# Patient Record
Sex: Male | Born: 1940
Health system: Southern US, Community
[De-identification: ages and names within clinical notes are randomized; demographics above are authoritative.]

## PROBLEM LIST (undated history)

## (undated) DIAGNOSIS — M1990 Unspecified osteoarthritis, unspecified site: Secondary | ICD-10-CM

## (undated) DIAGNOSIS — F0390 Unspecified dementia without behavioral disturbance: Secondary | ICD-10-CM

## (undated) DIAGNOSIS — N189 Chronic kidney disease, unspecified: Secondary | ICD-10-CM

## (undated) DIAGNOSIS — J449 Chronic obstructive pulmonary disease, unspecified: Secondary | ICD-10-CM

## (undated) DIAGNOSIS — E785 Hyperlipidemia, unspecified: Secondary | ICD-10-CM

## (undated) DIAGNOSIS — C801 Malignant (primary) neoplasm, unspecified: Secondary | ICD-10-CM

## (undated) DIAGNOSIS — C61 Malignant neoplasm of prostate: Secondary | ICD-10-CM

## (undated) DIAGNOSIS — I10 Essential (primary) hypertension: Secondary | ICD-10-CM

## (undated) DIAGNOSIS — D649 Anemia, unspecified: Secondary | ICD-10-CM

## (undated) HISTORY — DX: Anemia, unspecified: D64.9

## (undated) HISTORY — DX: Chronic kidney disease, unspecified: N18.9

## (undated) HISTORY — DX: Unspecified osteoarthritis, unspecified site: M19.90

## (undated) HISTORY — PX: FOOT SURGERY: SHX648

## (undated) HISTORY — DX: Chronic obstructive pulmonary disease, unspecified: J44.9

## (undated) HISTORY — DX: Unspecified dementia, unspecified severity, without behavioral disturbance, psychotic disturbance, mood disturbance, and anxiety: F03.90

---

## 1999-10-26 ENCOUNTER — Emergency Department (HOSPITAL_COMMUNITY): Admission: EM | Admit: 1999-10-26 | Discharge: 1999-10-26 | Payer: Self-pay | Admitting: Emergency Medicine

## 1999-10-26 ENCOUNTER — Encounter: Payer: Self-pay | Admitting: Emergency Medicine

## 2000-05-05 ENCOUNTER — Encounter: Admission: RE | Admit: 2000-05-05 | Discharge: 2000-08-03 | Payer: Self-pay | Admitting: Internal Medicine

## 2005-10-05 HISTORY — PX: CATARACT EXTRACTION W/ INTRAOCULAR LENS IMPLANT: SHX1309

## 2007-06-07 ENCOUNTER — Ambulatory Visit (HOSPITAL_BASED_OUTPATIENT_CLINIC_OR_DEPARTMENT_OTHER): Admission: RE | Admit: 2007-06-07 | Discharge: 2007-06-08 | Payer: Self-pay | Admitting: Orthopedic Surgery

## 2008-10-05 HISTORY — PX: SHOULDER OPEN ROTATOR CUFF REPAIR: SHX2407

## 2011-02-17 NOTE — Op Note (Signed)
[name] [name], [name] [name]                ACCOUNT NO.:  [id number]   MEDICAL RECORD NO.:  [id number]          PATIENT TYPE:  AMB   LOCATION:  [facility]                         FACILITY:  [facility]   PHYSICIAN:  Georges Lynch. Gioffre, M.D.DATE OF BIRTH:  April 18, 1941   DATE OF PROCEDURE:  06/07/2007  DATE OF DISCHARGE:                               OPERATIVE REPORT   SURGEON:  Georges Lynch. Darrelyn Hillock, M.D.   OPERATIONS:  Arlyn Leak, PA   PREOPERATIVE DIAGNOSES:  1. Torn rotator cuff tendon, left shoulder.  2. Severe impingement syndrome, left shoulder.   POSTOPERATIVE DIAGNOSES:  1. Torn rotator cuff tendon, left shoulder.  2. Severe impingement syndrome, left shoulder.   OPERATION:  1. Partial acromionectomy acromioplasty left shoulder.  2. Repair of a hole in the rotator cuff tendon left.  3. Restore tendon graft, the left shoulder.   PROCEDURE:  Under general anesthesia, first of all the patient had a  interscalene nerve block the left.  He was brought back to the operating  room, given a general anesthetic.  He had 1 gram of IV Ancef preop.  Sterile prep and drape of the left shoulder was carried out.  An  incision was made over the anterior aspect left shoulder.  Bleeders  identified and cauterized.  At this time the deltoid tendon was  separated from the acromion in the usual fashion.  I split a small part  of the proximal portion of the deltoid muscle.  At this time self-  retaining retractors were inserted.  I excised the subdeltoid bursa  which was chronically inflamed.  I then noted a severe downsloping and  beak shaped acromion.  I protected the cup with the Bennett retractor,  utilized the oscillating saw, did a partial acromionectomy.  I then  utilized the bur to even out the undersurface of the acromion.  Following that I thoroughly irrigated out the area.  It was noted he had  a small hole in his rotator cuff that actually was shown on the MRI.  I  put a couple of sutures across the  tendon to repair the tendon  primarily.  Following that we noted the tendon was quite thin as it  inserted down in the humerus so we then utilized a Restore tendon graft  and sutured that to the tendon.  We thoroughly irrigated out the area.  We bone waxed the undersurface of the acromion.  We inserted a small  piece of thrombin-soaked Gelfoam in the subacromial space for hemostasis  purposes.  The deltoid tendon and muscle then was reapproximated in the  usual fashion.  Subcu was closed with Vicryl, skin with metal staples  and the patient was injected with 15 mL 0.5% Marcaine and epinephrine in  the wound site and sterile Neosporin dressing was applied.  He was  placed in a sling.          ______________________________  Georges Lynch. Darrelyn Hillock, M.D.    RAG/MEDQ  D:  06/07/2007  T:  06/07/2007  Job:  [id number]

## 2011-07-17 LAB — I-STAT 8, (EC8 V) (CONVERTED LAB)
Acid-base deficit: 1
BUN: 22
Bicarbonate: 25.3 — ABNORMAL HIGH
Chloride: 105
Glucose, Bld: 145 — ABNORMAL HIGH
HCT: 40
Hemoglobin: 13.6
Operator id: 268271
Potassium: 4.8
Sodium: 140
TCO2: 27
pCO2, Ven: 49
pH, Ven: 7.321 — ABNORMAL HIGH

## 2011-12-09 ENCOUNTER — Other Ambulatory Visit: Payer: Self-pay | Admitting: Urology

## 2011-12-09 DIAGNOSIS — C61 Malignant neoplasm of prostate: Secondary | ICD-10-CM

## 2011-12-21 ENCOUNTER — Encounter (HOSPITAL_COMMUNITY)
Admission: RE | Admit: 2011-12-21 | Discharge: 2011-12-21 | Disposition: A | Discharge status: Home / Self Care | Payer: 59 | Source: Ambulatory Visit | Attending: Urology | Admitting: Urology

## 2011-12-21 ENCOUNTER — Encounter (HOSPITAL_COMMUNITY): Payer: Self-pay

## 2011-12-21 DIAGNOSIS — C61 Malignant neoplasm of prostate: Secondary | ICD-10-CM | POA: Insufficient documentation

## 2011-12-21 DIAGNOSIS — R972 Elevated prostate specific antigen [PSA]: Secondary | ICD-10-CM | POA: Insufficient documentation

## 2011-12-21 HISTORY — DX: Malignant (primary) neoplasm, unspecified: C80.1

## 2011-12-21 MED ORDER — TECHNETIUM TC 99M MEDRONATE IV KIT: 25.0000 | PACK | Freq: Once | INTRAVENOUS | Status: DC | PRN

## 2012-01-05 ENCOUNTER — Ambulatory Visit (HOSPITAL_COMMUNITY)
Admission: RE | Admit: 2012-01-05 | Discharge: 2012-01-05 | Disposition: A | Discharge status: Home / Self Care | Payer: Medicare PPO | Source: Ambulatory Visit | Attending: Urology | Admitting: Urology

## 2012-01-05 ENCOUNTER — Other Ambulatory Visit: Payer: Self-pay | Admitting: Urology

## 2012-01-05 DIAGNOSIS — C7952 Secondary malignant neoplasm of bone marrow: Secondary | ICD-10-CM

## 2012-01-05 DIAGNOSIS — C7951 Secondary malignant neoplasm of bone: Secondary | ICD-10-CM

## 2012-01-05 DIAGNOSIS — C61 Malignant neoplasm of prostate: Secondary | ICD-10-CM | POA: Insufficient documentation

## 2012-01-05 DIAGNOSIS — M5137 Other intervertebral disc degeneration, lumbosacral region: Secondary | ICD-10-CM | POA: Insufficient documentation

## 2012-01-05 DIAGNOSIS — M51379 Other intervertebral disc degeneration, lumbosacral region without mention of lumbar back pain or lower extremity pain: Secondary | ICD-10-CM | POA: Insufficient documentation

## 2012-04-01 ENCOUNTER — Other Ambulatory Visit: Payer: Self-pay | Admitting: Urology

## 2012-04-01 DIAGNOSIS — C61 Malignant neoplasm of prostate: Secondary | ICD-10-CM

## 2012-04-06 ENCOUNTER — Encounter (HOSPITAL_COMMUNITY): Payer: Self-pay | Admitting: Pharmacy Technician

## 2012-04-08 ENCOUNTER — Other Ambulatory Visit: Payer: Self-pay | Admitting: Radiology

## 2012-04-11 ENCOUNTER — Encounter (HOSPITAL_COMMUNITY): Payer: Self-pay

## 2012-04-11 ENCOUNTER — Ambulatory Visit (HOSPITAL_COMMUNITY)
Admission: RE | Admit: 2012-04-11 | Discharge: 2012-04-11 | Disposition: A | Discharge status: Home / Self Care | Payer: Medicare PPO | Source: Ambulatory Visit | Attending: Urology | Admitting: Urology

## 2012-04-11 DIAGNOSIS — Z79899 Other long term (current) drug therapy: Secondary | ICD-10-CM | POA: Insufficient documentation

## 2012-04-11 DIAGNOSIS — E785 Hyperlipidemia, unspecified: Secondary | ICD-10-CM | POA: Insufficient documentation

## 2012-04-11 DIAGNOSIS — E119 Type 2 diabetes mellitus without complications: Secondary | ICD-10-CM | POA: Insufficient documentation

## 2012-04-11 DIAGNOSIS — R599 Enlarged lymph nodes, unspecified: Secondary | ICD-10-CM | POA: Insufficient documentation

## 2012-04-11 DIAGNOSIS — C61 Malignant neoplasm of prostate: Secondary | ICD-10-CM

## 2012-04-11 DIAGNOSIS — I10 Essential (primary) hypertension: Secondary | ICD-10-CM | POA: Insufficient documentation

## 2012-04-11 HISTORY — DX: Essential (primary) hypertension: I10

## 2012-04-11 HISTORY — DX: Hyperlipidemia, unspecified: E78.5

## 2012-04-11 LAB — CBC
MCH: 30.2 pg (ref 26.0–34.0)
MCV: 88.1 fL (ref 78.0–100.0)
Platelets: 147 10*3/uL — ABNORMAL LOW (ref 150–400)
RDW: 13.4 % (ref 11.5–15.5)
WBC: 6.2 10*3/uL (ref 4.0–10.5)

## 2012-04-11 LAB — PROTIME-INR: Prothrombin Time: 12.6 seconds (ref 11.6–15.2)

## 2012-04-11 LAB — GLUCOSE, CAPILLARY: Glucose-Capillary: 112 mg/dL — ABNORMAL HIGH (ref 70–99)

## 2012-04-11 MED ORDER — FENTANYL CITRATE 0.05 MG/ML IJ SOLN
INTRAMUSCULAR | Status: AC
  Filled 2012-04-11: qty 6

## 2012-04-11 MED ORDER — SODIUM CHLORIDE 0.9 % IV SOLN
Freq: Once | INTRAVENOUS | Status: AC
  Administered 2012-04-11: 08:00:00 via INTRAVENOUS

## 2012-04-11 MED ORDER — MIDAZOLAM HCL 5 MG/5ML IJ SOLN
INTRAMUSCULAR | Status: AC | PRN
  Administered 2012-04-11 (×4): 1 mg via INTRAVENOUS

## 2012-04-11 MED ORDER — MIDAZOLAM HCL 2 MG/2ML IJ SOLN
INTRAMUSCULAR | Status: AC
  Filled 2012-04-11: qty 6

## 2012-04-11 MED ORDER — FENTANYL CITRATE 0.05 MG/ML IJ SOLN
INTRAMUSCULAR | Status: AC | PRN
  Administered 2012-04-11: 50 ug via INTRAVENOUS
  Administered 2012-04-11: 100 ug via INTRAVENOUS
  Administered 2012-04-11: 50 ug via INTRAVENOUS

## 2012-04-11 MED ORDER — HYDROCODONE-ACETAMINOPHEN 5-325 MG PO TABS
1.0000 | ORAL_TABLET | ORAL | Status: DC | PRN
  Filled 2012-04-11: qty 2

## 2012-04-11 NOTE — Procedures (Signed)
Interventional Radiology Procedure Note  Procedure: CT guided core (18G) biopsy of right internal iliac node concerning for metastatic prostate ca. Complications: No immediate, pt tolerated procedure well.

## 2012-04-11 NOTE — H&P (Signed)
Chief Complaint: Prostate cancer Referring Physician:Dahlstedt HPI: [name] [name] is an 71 y.o. male with apparently newly diagnosed prostate cancer. He is referred for biopsy of a right iliac LN as discussed bu Dr. Retta Diones and Dr. Deanne Coffer. He otherwise feels fine.  Past Medical History:  Past Medical History  Diagnosis Date  . Cancer   . Hypertension   . Diabetes mellitus   . Hyperlipidemia     Past Surgical History:  Past Surgical History  Procedure Date  . No past surgeries     Family History: History reviewed. No pertinent family history.  Social History:  reports that he quit smoking about 7 years ago. He does not have any smokeless tobacco history on file. He reports that he does not use illicit drugs. His alcohol history not on file.  Allergies: No Known Allergies  Medications: albuterol (PROVENTIL) (2.5 MG/3ML) 0.083% nebulizer solution (Taking) Sig - Route: Take 2.5 mg by nebulization every 6 (six) hours as needed. Wheezing and shortness of breath - Nebulization Class: Historical Med aspirin 325 MG tablet (Taking) Sig - Route: Take 325 mg by mouth daily. - Oral Class: Historical Med cholecalciferol (VITAMIN D) 1000 UNITS tablet (Taking) Sig - Route: Take 1,000 Units by mouth daily. - Oral Class: Historical Med FLUoxetine (PROZAC) 40 MG capsule (Taking) Sig - Route: Take 40 mg by mouth every morning. - Oral Class: Historical Med glipiZIDE (GLUCOTROL) 10 MG tablet (Taking) Sig - Route: Take 10 mg by mouth 2 (two) times daily before a meal. - Oral Class: Historical Med hydrochlorothiazide (HYDRODIURIL) 25 MG tablet (Taking) Sig - Route: Take 25 mg by mouth every morning. - Oral Class: Historical Med hydroxypropyl methylcellulose (ISOPTO TEARS) 2.5 % ophthalmic solution (Taking) Sig - Route: Place 1 drop into both eyes as needed. - Both Eyes Class: Historical Med lisinopril (PRINIVIL,ZESTRIL) 20 MG tablet (Taking) Sig - Route: Take 10 mg by mouth every morning. - Oral Class:  Historical Med metFORMIN (GLUCOPHAGE) 1000 MG tablet (Taking) Sig - Route: Take 1,000 mg by mouth 2 (two) times daily with a meal. - Oral Class: Historical Med simvastatin (ZOCOR) 40 MG tablet (Taking) Sig - Route: Take 20 mg by mouth at bedtime. - Oral Class: Historical Med tiotropium (SPIRIVA) 18 MCG inhalation capsule (Taking) Sig - Route: Place 18 mcg into inhaler and inhale daily. - Inhalation Class: Historical Med   Please HPI for pertinent positives, otherwise complete 10 system ROS negative.  Physical Exam: Blood pressure 140/84, pulse 83, temperature 97.5 F (36.4 C), temperature source Oral, resp. rate 20, SpO2 95.00%. There is no height or weight on file to calculate BMI.   General Appearance:  Alert, cooperative, no distress, appears stated age  Head:  Normocephalic, without obvious abnormality, atraumatic  ENT: Unremarkable  Neck: Supple, symmetrical, trachea midline, no adenopathy, thyroid: not enlarged, symmetric, no tenderness/mass/nodules  Lungs:   Clear to auscultation bilaterally, no w/r/r, respirations unlabored without use of accessory muscles.  Chest Wall:  No tenderness or deformity  Heart:  Regular rate and rhythm, S1, S2 normal, no murmur, rub or gallop. Carotids 2+ without bruit.  Abdomen:   Soft, non-tender, non distended. Bowel sounds active all four quadrants,  no masses, no organomegaly.  Extremities: Extremities normal, atraumatic, no cyanosis or edema  Neurologic: Normal affect, no gross deficits.    Assessment/Plan Hx of prostate cancer. For CT guided right iliac LN biopsy. Procedure discussed including risks and complications. Labs pending. Consent signed in chart.  Brayton El PA-C 04/11/2012, 8:27 AM

## 2012-07-15 ENCOUNTER — Other Ambulatory Visit: Payer: Self-pay | Admitting: Urology

## 2012-07-15 DIAGNOSIS — C61 Malignant neoplasm of prostate: Secondary | ICD-10-CM

## 2012-08-01 ENCOUNTER — Encounter (HOSPITAL_COMMUNITY)
Admission: RE | Admit: 2012-08-01 | Discharge: 2012-08-01 | Disposition: A | Discharge status: Home / Self Care | Payer: Medicare PPO | Source: Ambulatory Visit | Attending: Urology | Admitting: Urology

## 2012-08-01 DIAGNOSIS — R972 Elevated prostate specific antigen [PSA]: Secondary | ICD-10-CM | POA: Insufficient documentation

## 2012-08-01 DIAGNOSIS — C61 Malignant neoplasm of prostate: Secondary | ICD-10-CM | POA: Insufficient documentation

## 2012-08-01 MED ORDER — TECHNETIUM TC 99M MEDRONATE IV KIT
25.0000 | PACK | Freq: Once | INTRAVENOUS | Status: AC | PRN
  Administered 2012-08-01: 25 via INTRAVENOUS

## 2013-07-04 ENCOUNTER — Inpatient Hospital Stay (HOSPITAL_COMMUNITY)
Admission: EM | Admit: 2013-07-04 | Discharge: 2013-07-07 | DRG: 558 | Disposition: A | Discharge status: Home Health | Payer: Medicare PPO | Attending: Internal Medicine | Admitting: Internal Medicine

## 2013-07-04 ENCOUNTER — Emergency Department (HOSPITAL_COMMUNITY): Payer: Medicare PPO

## 2013-07-04 ENCOUNTER — Encounter (HOSPITAL_COMMUNITY): Payer: Self-pay | Admitting: Emergency Medicine

## 2013-07-04 DIAGNOSIS — L089 Local infection of the skin and subcutaneous tissue, unspecified: Secondary | ICD-10-CM | POA: Diagnosis present

## 2013-07-04 DIAGNOSIS — W19XXXA Unspecified fall, initial encounter: Secondary | ICD-10-CM | POA: Diagnosis present

## 2013-07-04 DIAGNOSIS — N189 Chronic kidney disease, unspecified: Secondary | ICD-10-CM | POA: Diagnosis present

## 2013-07-04 DIAGNOSIS — N400 Enlarged prostate without lower urinary tract symptoms: Secondary | ICD-10-CM | POA: Diagnosis present

## 2013-07-04 DIAGNOSIS — M7918 Myalgia, other site: Secondary | ICD-10-CM

## 2013-07-04 DIAGNOSIS — Z87311 Personal history of (healed) other pathological fracture: Secondary | ICD-10-CM

## 2013-07-04 DIAGNOSIS — F329 Major depressive disorder, single episode, unspecified: Secondary | ICD-10-CM | POA: Diagnosis present

## 2013-07-04 DIAGNOSIS — Z8546 Personal history of malignant neoplasm of prostate: Secondary | ICD-10-CM

## 2013-07-04 DIAGNOSIS — M48061 Spinal stenosis, lumbar region without neurogenic claudication: Secondary | ICD-10-CM | POA: Diagnosis present

## 2013-07-04 DIAGNOSIS — C61 Malignant neoplasm of prostate: Secondary | ICD-10-CM | POA: Diagnosis present

## 2013-07-04 DIAGNOSIS — E119 Type 2 diabetes mellitus without complications: Secondary | ICD-10-CM

## 2013-07-04 DIAGNOSIS — F32A Depression, unspecified: Secondary | ICD-10-CM

## 2013-07-04 DIAGNOSIS — F3289 Other specified depressive episodes: Secondary | ICD-10-CM | POA: Diagnosis present

## 2013-07-04 DIAGNOSIS — N182 Chronic kidney disease, stage 2 (mild): Secondary | ICD-10-CM | POA: Diagnosis present

## 2013-07-04 DIAGNOSIS — W19XXXD Unspecified fall, subsequent encounter: Secondary | ICD-10-CM

## 2013-07-04 DIAGNOSIS — M6282 Rhabdomyolysis: Principal | ICD-10-CM

## 2013-07-04 DIAGNOSIS — M79659 Pain in unspecified thigh: Secondary | ICD-10-CM

## 2013-07-04 DIAGNOSIS — R0902 Hypoxemia: Secondary | ICD-10-CM

## 2013-07-04 DIAGNOSIS — Z87891 Personal history of nicotine dependence: Secondary | ICD-10-CM

## 2013-07-04 DIAGNOSIS — Y92009 Unspecified place in unspecified non-institutional (private) residence as the place of occurrence of the external cause: Secondary | ICD-10-CM

## 2013-07-04 DIAGNOSIS — I129 Hypertensive chronic kidney disease with stage 1 through stage 4 chronic kidney disease, or unspecified chronic kidney disease: Secondary | ICD-10-CM | POA: Diagnosis present

## 2013-07-04 DIAGNOSIS — J441 Chronic obstructive pulmonary disease with (acute) exacerbation: Secondary | ICD-10-CM

## 2013-07-04 DIAGNOSIS — IMO0002 Reserved for concepts with insufficient information to code with codable children: Secondary | ICD-10-CM | POA: Diagnosis present

## 2013-07-04 DIAGNOSIS — M79606 Pain in leg, unspecified: Secondary | ICD-10-CM | POA: Diagnosis present

## 2013-07-04 DIAGNOSIS — M79609 Pain in unspecified limb: Secondary | ICD-10-CM | POA: Diagnosis present

## 2013-07-04 DIAGNOSIS — I10 Essential (primary) hypertension: Secondary | ICD-10-CM

## 2013-07-04 DIAGNOSIS — E785 Hyperlipidemia, unspecified: Secondary | ICD-10-CM

## 2013-07-04 LAB — CBC WITH DIFFERENTIAL/PLATELET
Basophils Absolute: 0.1 10*3/uL (ref 0.0–0.1)
Eosinophils Absolute: 0.3 10*3/uL (ref 0.0–0.7)
Eosinophils Relative: 4 % (ref 0–5)
HCT: 42.5 % (ref 39.0–52.0)
Hemoglobin: 14.8 g/dL (ref 13.0–17.0)
Lymphocytes Relative: 14 % (ref 12–46)
Lymphs Abs: 1.2 10*3/uL (ref 0.7–4.0)
MCHC: 34.8 g/dL (ref 30.0–36.0)
MCV: 90.8 fL (ref 78.0–100.0)
Monocytes Absolute: 0.6 10*3/uL (ref 0.1–1.0)
Monocytes Relative: 8 % (ref 3–12)
Neutro Abs: 6 10*3/uL (ref 1.7–7.7)
RDW: 12.6 % (ref 11.5–15.5)

## 2013-07-04 LAB — BASIC METABOLIC PANEL
BUN: 19 mg/dL (ref 6–23)
CO2: 28 mEq/L (ref 19–32)
Calcium: 9.5 mg/dL (ref 8.4–10.5)
Chloride: 96 mEq/L (ref 96–112)
Creatinine, Ser: 1.24 mg/dL (ref 0.50–1.35)
Glucose, Bld: 58 mg/dL — ABNORMAL LOW (ref 70–99)

## 2013-07-04 LAB — GLUCOSE, CAPILLARY

## 2013-07-04 MED ORDER — INSULIN ASPART 100 UNIT/ML ~~LOC~~ SOLN
0.0000 [IU] | Freq: Every day | SUBCUTANEOUS | Status: DC
  Administered 2013-07-05: 3 [IU] via SUBCUTANEOUS

## 2013-07-04 MED ORDER — SODIUM CHLORIDE 0.9 % IV SOLN
INTRAVENOUS | Status: DC
  Administered 2013-07-04: 14:00:00 via INTRAVENOUS

## 2013-07-04 MED ORDER — POLYVINYL ALCOHOL 1.4 % OP SOLN
1.0000 [drp] | OPHTHALMIC | Status: DC | PRN
  Filled 2013-07-04: qty 15

## 2013-07-04 MED ORDER — ACETAMINOPHEN 650 MG RE SUPP: 650.0000 mg | Freq: Four times a day (QID) | RECTAL | Status: DC | PRN

## 2013-07-04 MED ORDER — FINASTERIDE 5 MG PO TABS
5.0000 mg | ORAL_TABLET | Freq: Every day | ORAL | Status: DC
  Administered 2013-07-05 – 2013-07-07 (×3): 5 mg via ORAL
  Filled 2013-07-04 (×3): qty 1

## 2013-07-04 MED ORDER — ALBUTEROL SULFATE (5 MG/ML) 0.5% IN NEBU: 2.5000 mg | INHALATION_SOLUTION | Freq: Four times a day (QID) | RESPIRATORY_TRACT | Status: DC | PRN

## 2013-07-04 MED ORDER — HYDROMORPHONE HCL PF 1 MG/ML IJ SOLN
1.0000 mg | Freq: Once | INTRAMUSCULAR | Status: AC
  Administered 2013-07-04: 1 mg via INTRAVENOUS

## 2013-07-04 MED ORDER — PANTOPRAZOLE SODIUM 40 MG PO TBEC
40.0000 mg | DELAYED_RELEASE_TABLET | Freq: Every day | ORAL | Status: DC
  Administered 2013-07-05 – 2013-07-07 (×3): 40 mg via ORAL
  Filled 2013-07-04 (×3): qty 1

## 2013-07-04 MED ORDER — ALBUTEROL SULFATE (5 MG/ML) 0.5% IN NEBU
5.0000 mg | INHALATION_SOLUTION | Freq: Once | RESPIRATORY_TRACT | Status: AC
  Administered 2013-07-04: 5 mg via RESPIRATORY_TRACT

## 2013-07-04 MED ORDER — INSULIN ASPART 100 UNIT/ML ~~LOC~~ SOLN
0.0000 [IU] | Freq: Three times a day (TID) | SUBCUTANEOUS | Status: DC
  Administered 2013-07-05: 8 [IU] via SUBCUTANEOUS
  Administered 2013-07-05 – 2013-07-06 (×2): 5 [IU] via SUBCUTANEOUS
  Administered 2013-07-06: 8 [IU] via SUBCUTANEOUS
  Administered 2013-07-06: 5 [IU] via SUBCUTANEOUS

## 2013-07-04 MED ORDER — DIPHENHYDRAMINE HCL 25 MG PO CAPS
ORAL_CAPSULE | ORAL | Status: AC
  Filled 2013-07-04: qty 1

## 2013-07-04 MED ORDER — HYDROMORPHONE HCL PF 1 MG/ML IJ SOLN: 1.0000 mg | Freq: Once | INTRAMUSCULAR | Status: DC

## 2013-07-04 MED ORDER — METHYLPREDNISOLONE SODIUM SUCC 40 MG IJ SOLR
40.0000 mg | Freq: Two times a day (BID) | INTRAMUSCULAR | Status: DC
  Administered 2013-07-04 – 2013-07-05 (×2): 40 mg via INTRAVENOUS
  Filled 2013-07-04 (×3): qty 1

## 2013-07-04 MED ORDER — ACETAMINOPHEN 325 MG PO TABS: 650.0000 mg | ORAL_TABLET | Freq: Four times a day (QID) | ORAL | Status: DC | PRN

## 2013-07-04 MED ORDER — ONDANSETRON HCL 4 MG/2ML IJ SOLN: 4.0000 mg | Freq: Four times a day (QID) | INTRAMUSCULAR | Status: DC | PRN

## 2013-07-04 MED ORDER — TIOTROPIUM BROMIDE MONOHYDRATE 18 MCG IN CAPS
18.0000 ug | ORAL_CAPSULE | Freq: Every day | RESPIRATORY_TRACT | Status: DC
  Administered 2013-07-05 – 2013-07-06 (×2): 18 ug via RESPIRATORY_TRACT
  Filled 2013-07-04: qty 5

## 2013-07-04 MED ORDER — HYDROMORPHONE HCL PF 1 MG/ML IJ SOLN
INTRAMUSCULAR | Status: AC
  Administered 2013-07-04: 1 mg via INTRAVENOUS
  Filled 2013-07-04: qty 1

## 2013-07-04 MED ORDER — HYDROMORPHONE HCL PF 1 MG/ML IJ SOLN
INTRAMUSCULAR | Status: AC
  Filled 2013-07-04: qty 1

## 2013-07-04 MED ORDER — HYDROCHLOROTHIAZIDE 25 MG PO TABS
25.0000 mg | ORAL_TABLET | Freq: Every morning | ORAL | Status: DC
  Administered 2013-07-05 – 2013-07-07 (×3): 25 mg via ORAL
  Filled 2013-07-04 (×3): qty 1

## 2013-07-04 MED ORDER — BUDESONIDE-FORMOTEROL FUMARATE 160-4.5 MCG/ACT IN AERO
2.0000 | INHALATION_SPRAY | Freq: Two times a day (BID) | RESPIRATORY_TRACT | Status: DC
  Administered 2013-07-05 – 2013-07-06 (×4): 2 via RESPIRATORY_TRACT
  Filled 2013-07-04: qty 6

## 2013-07-04 MED ORDER — HYPROMELLOSE (GONIOSCOPIC) 2.5 % OP SOLN: 1.0000 [drp] | OPHTHALMIC | Status: DC | PRN

## 2013-07-04 MED ORDER — HYDROMORPHONE HCL PF 1 MG/ML IJ SOLN: 1.0000 mg | INTRAMUSCULAR | Status: DC | PRN

## 2013-07-04 MED ORDER — DIPHENHYDRAMINE HCL 25 MG PO CAPS
25.0000 mg | ORAL_CAPSULE | Freq: Once | ORAL | Status: AC
  Administered 2013-07-04: 25 mg via ORAL

## 2013-07-04 MED ORDER — IBUPROFEN 200 MG PO TABS
600.0000 mg | ORAL_TABLET | Freq: Once | ORAL | Status: AC
  Administered 2013-07-04: 600 mg via ORAL

## 2013-07-04 MED ORDER — FLUOXETINE HCL 20 MG PO CAPS
40.0000 mg | ORAL_CAPSULE | Freq: Every morning | ORAL | Status: DC
  Administered 2013-07-05 – 2013-07-07 (×3): 40 mg via ORAL
  Filled 2013-07-04 (×3): qty 2

## 2013-07-04 MED ORDER — LISINOPRIL 5 MG PO TABS
5.0000 mg | ORAL_TABLET | Freq: Every morning | ORAL | Status: DC
  Administered 2013-07-05 – 2013-07-06 (×2): 5 mg via ORAL
  Filled 2013-07-04 (×3): qty 1

## 2013-07-04 MED ORDER — SODIUM CHLORIDE 0.9 % IV SOLN
INTRAVENOUS | Status: DC
  Administered 2013-07-04 – 2013-07-06 (×5): via INTRAVENOUS

## 2013-07-04 MED ORDER — HEPARIN SODIUM (PORCINE) 5000 UNIT/ML IJ SOLN
5000.0000 [IU] | Freq: Three times a day (TID) | INTRAMUSCULAR | Status: DC
Start: 2013-07-04 — End: 2013-07-07
  Administered 2013-07-04 – 2013-07-07 (×8): 5000 [IU] via SUBCUTANEOUS
  Filled 2013-07-04 (×11): qty 1

## 2013-07-04 MED ORDER — HYDROMORPHONE HCL PF 1 MG/ML IJ SOLN
1.0000 mg | INTRAMUSCULAR | Status: AC | PRN
  Administered 2013-07-04 (×2): 1 mg via INTRAVENOUS

## 2013-07-04 MED ORDER — SODIUM CHLORIDE 0.9 % IN NEBU
INHALATION_SOLUTION | RESPIRATORY_TRACT | Status: AC
  Administered 2013-07-04: 16:00:00
  Filled 2013-07-04: qty 3

## 2013-07-04 MED ORDER — ALBUTEROL SULFATE (5 MG/ML) 0.5% IN NEBU
INHALATION_SOLUTION | RESPIRATORY_TRACT | Status: AC
  Administered 2013-07-04: 5 mg via RESPIRATORY_TRACT
  Filled 2013-07-04: qty 1

## 2013-07-04 MED ORDER — IBUPROFEN 200 MG PO TABS
ORAL_TABLET | ORAL | Status: AC
  Filled 2013-07-04: qty 3

## 2013-07-04 MED ORDER — ONDANSETRON HCL 4 MG PO TABS: 4.0000 mg | ORAL_TABLET | Freq: Four times a day (QID) | ORAL | Status: DC | PRN

## 2013-07-04 MED ORDER — ASPIRIN 325 MG PO TABS
325.0000 mg | ORAL_TABLET | Freq: Every day | ORAL | Status: DC
  Administered 2013-07-05 – 2013-07-07 (×3): 325 mg via ORAL
  Filled 2013-07-04 (×4): qty 1

## 2013-07-04 MED ORDER — OXYCODONE HCL 5 MG PO TABS
5.0000 mg | ORAL_TABLET | ORAL | Status: DC | PRN
  Administered 2013-07-05: 5 mg via ORAL
  Filled 2013-07-04: qty 1

## 2013-07-04 NOTE — Progress Notes (Signed)
Rn attempt x2 to get report from ED was unsuccessful. Will try again.

## 2013-07-04 NOTE — ED Provider Notes (Signed)
CSN: [id number]     Arrival date & time 07/04/13  1210 History   First MD Initiated Contact with Patient 07/04/13 1236     Chief Complaint  Patient presents with  . Leg Pain    HPI Pt fell about a week and a half ago.  Since that time he started having pain in his hip and right leg.  The pain increases with trying to stand or walk at all.  He also has pain in his lower back but he does not think it is related.  No numbness or weakness but the pain is so severe he cannot walk..  He has noticed some swelling in his toes but not his leg or feet. The pain is sharp. In the right hip and thigh. The symptoms have been increasing in severity. Patient denies any trouble with nausea vomiting or abdominal pain to me. He has a history of prostate cancer without metastases to the bone. He is scheduled for routine surveillance test. Patient is not on any chemotherapy. Past Medical History  Diagnosis Date  . Cancer   . Hypertension   . Diabetes mellitus   . Hyperlipidemia    Past Surgical History  Procedure Laterality Date  . No past surgeries    . Cataract extraction w/ intraocular lens implant  2007    bilateral  . Shoulder open rotator cuff repair  2010    left   No family history on file. History  Substance Use Topics  . Smoking status: Former Smoker    Quit date: 04/11/2005  . Smokeless tobacco: Not on file  . Alcohol Use:     Review of Systems  All other systems reviewed and are negative.    Allergies  Review of patient's allergies indicates no known allergies.  Home Medications   Current Outpatient Rx  Name  Route  Sig  Dispense  Refill  . albuterol (PROVENTIL) (2.5 MG/3ML) 0.083% nebulizer solution   Nebulization   Take 2.5 mg by nebulization every 6 (six) hours as needed for wheezing or shortness of breath.          Marland Kitchen aspirin 325 MG tablet   Oral   Take 325 mg by mouth daily.         . cholecalciferol (VITAMIN D) 1000 UNITS tablet   Oral   Take 1,000 Units by  mouth daily.         . finasteride (PROSCAR) 5 MG tablet   Oral   Take 5 mg by mouth daily.         Marland Kitchen FLUoxetine (PROZAC) 40 MG capsule   Oral   Take 40 mg by mouth every morning.         Marland Kitchen glipiZIDE (GLUCOTROL) 10 MG tablet   Oral   Take 10 mg by mouth 2 (two) times daily before a meal.         . hydrochlorothiazide (HYDRODIURIL) 25 MG tablet   Oral   Take 25 mg by mouth every morning.         . hydroxypropyl methylcellulose (ISOPTO TEARS) 2.5 % ophthalmic solution   Both Eyes   Place 1 drop into both eyes as needed (for dryness).          Marland Kitchen lisinopril (PRINIVIL,ZESTRIL) 20 MG tablet   Oral   Take 10 mg by mouth every morning.         . metFORMIN (GLUCOPHAGE) 1000 MG tablet   Oral   Take 1,000 mg by mouth 2 (  two) times daily with a meal.         . simvastatin (ZOCOR) 40 MG tablet   Oral   Take 20 mg by mouth at bedtime.         Marland Kitchen tiotropium (SPIRIVA) 18 MCG inhalation capsule   Inhalation   Place 18 mcg into inhaler and inhale daily.          BP 169/86  Pulse 76  Temp(Src) 98.4 F (36.9 C) (Oral)  Resp 17  SpO2 93% Physical Exam  Nursing note and vitals reviewed. Constitutional: He appears well-developed and well-nourished. No distress.  HENT:  Head: Normocephalic and atraumatic.  Right Ear: External ear normal.  Left Ear: External ear normal.  Eyes: Conjunctivae are normal. Right eye exhibits no discharge. Left eye exhibits no discharge. No scleral icterus.  Neck: Neck supple. No tracheal deviation present.  Cardiovascular: Normal rate, regular rhythm and intact distal pulses.   Pulmonary/Chest: Effort normal and breath sounds normal. No stridor. No respiratory distress. He has no wheezes. He has no rales.  Abdominal: Soft. Bowel sounds are normal. He exhibits no distension. There is no tenderness. There is no rebound and no guarding.  Musculoskeletal: He exhibits tenderness (mild tenderness palpation right hip, some pain with internal  and external rotation of the leg, patient is able to extend his right leg at the hip). He exhibits no edema.       Lumbar back: He exhibits no tenderness and no bony tenderness.  No tenderness to palpation with pelvic compression no instability, normal pulses bilateral lower extremities, no edema  Neurological: He is alert. He has normal strength. No sensory deficit. Cranial nerve deficit:  no gross defecits noted. He exhibits normal muscle tone. He displays no seizure activity. Coordination normal.  Skin: Skin is warm and dry. No rash noted.  Psychiatric: He has a normal mood and affect.    ED Course  Procedures (including critical care time) Labs Review Labs Reviewed  BASIC METABOLIC PANEL - Abnormal; Notable for the following:    Glucose, Bld 58 (*)    GFR calc non Af Amer 56 (*)    GFR calc Af Amer 65 (*)    All other components within normal limits  CBC WITH DIFFERENTIAL   Imaging Review Dg Lumbar Spine Complete  07/04/2013   CLINICAL DATA:  Fall, low back pain  EXAM: LUMBAR SPINE - COMPLETE 4+ VIEW  COMPARISON:  CT abdomen pelvis dated 01/28/2012  FINDINGS: Five lumbar type vertebral bodies.  Straightening of the lumbar spine.  Mild superior endplate compression deformity at L2, chronic.  Vertebral body heights are otherwise maintained.  Moderate multilevel degenerative changes.  Visualized bony pelvis appears intact.  Vascular calcifications.  IMPRESSION: Mild superior endplate compression deformity at L2, chronic.  Moderate multilevel degenerative changes.   Electronically Signed   By: Charline Bills M.D.   On: 07/04/2013 14:08   Dg Hip Complete Right  07/04/2013   CLINICAL DATA:  Fall, right hip pain  EXAM: RIGHT HIP - COMPLETE 2+ VIEW  COMPARISON:  None.  FINDINGS: No fracture or dislocation is seen.  Bilateral hip joints are symmetric.  Visualized bony pelvis appears intact.  Degenerative changes of the lower lumbar spine.  Vascular calcifications.  IMPRESSION: No fracture or  dislocation is seen.   Electronically Signed   By: Charline Bills M.D.   On: 07/04/2013 14:06   1500  Pt's xrays do not show any acute finding.  Pt however is still having severe pain.  Unable to ambulate.  Will give additional pain medications.  Plan on MRI of lumbar and hip to evaluate for occult fracture.  Borderline o2 sats.  Pt does smoke.  Will check CXR and give albuterol neb. MDM   Will turn over to oncoming MD.  MRI pending.    Celene Kras, MD 07/04/13 [id number]

## 2013-07-04 NOTE — ED Notes (Signed)
Pt returned from [facility], requesting pain meds.

## 2013-07-04 NOTE — H&P (Signed)
[facility] History and Physical  [name] [name] EAV:[id number] DOB: 11/30/1940 DOA: 07/04/2013  Referring physician: Dr. Wilkie Aye PCP: Hoyle Sauer, MD   Chief Complaint: Mechanical fall, bilateral lower extremities pain and hypoxia  HPI: [name] [name] is a 72 y.o. male with past medical history significant for hypertension, hyperlipidemia, diabetes and history of prostate cancer/BPH; came to the hospital complaining of bilateral lower strandy pain. Patient reports that he fail about week and a half ago at home. He was a mechanical fall. Since that time he started having pain affecting both hips and thighs. Patient reports that despite the use of NSAID's, the pain continues to bother him to the point that he is unable to walk or to bear any weight at this moment. Patient denies any chest pain, nausea, vomiting, abdominal pain, dysuria, no worsening shortness of breath (reports that time to time he needs to stop on exertion in order to catch his breath and that he may have some nonproductive cough here and there) or any other acute complaints.   in the ED patient was found to have bilateral muscle inflammation affecting both thighs, hypoxia and decreased breath sounds bilaterally. [facility] has been called to admit the patient for further evaluation and treatment.   Review of Systems:  Negative except as otherwise mentioned on history of present illness.  Past Medical History  Diagnosis Date  . Cancer   . Hypertension   . Diabetes mellitus   . Hyperlipidemia    Past Surgical History  Procedure Laterality Date  . No past surgeries    . Cataract extraction w/ intraocular lens implant  2007    bilateral  . Shoulder open rotator cuff repair  2010    left   Social History:  reports that he quit smoking about 8 years ago. He does not have any smokeless tobacco history on file. He reports that he does not use illicit drugs. His alcohol history is not on file.  No Known  Allergies  Family history: Positive for hypertension, diabetes, hyperlipidemia  Prior to Admission medications   Medication Sig Start Date End Date Taking? Authorizing Provider  albuterol (PROVENTIL) (2.5 MG/3ML) 0.083% nebulizer solution Take 2.5 mg by nebulization every 6 (six) hours as needed for wheezing or shortness of breath.    Yes Historical Provider, MD  aspirin 325 MG tablet Take 325 mg by mouth daily.   Yes Historical Provider, MD  cholecalciferol (VITAMIN D) 1000 UNITS tablet Take 1,000 Units by mouth daily.   Yes Historical Provider, MD  finasteride (PROSCAR) 5 MG tablet Take 5 mg by mouth daily.   Yes Historical Provider, MD  FLUoxetine (PROZAC) 40 MG capsule Take 40 mg by mouth every morning.   Yes Historical Provider, MD  glipiZIDE (GLUCOTROL) 10 MG tablet Take 10 mg by mouth 2 (two) times daily before a meal.   Yes Historical Provider, MD  hydrochlorothiazide (HYDRODIURIL) 25 MG tablet Take 25 mg by mouth every morning.   Yes Historical Provider, MD  hydroxypropyl methylcellulose (ISOPTO TEARS) 2.5 % ophthalmic solution Place 1 drop into both eyes as needed (for dryness).    Yes Historical Provider, MD  lisinopril (PRINIVIL,ZESTRIL) 20 MG tablet Take 10 mg by mouth every morning.   Yes Historical Provider, MD  metFORMIN (GLUCOPHAGE) 1000 MG tablet Take 1,000 mg by mouth 2 (two) times daily with a meal.   Yes Historical Provider, MD  simvastatin (ZOCOR) 40 MG tablet Take 20 mg by mouth at bedtime.   Yes Historical  Provider, MD  tiotropium (SPIRIVA) 18 MCG inhalation capsule Place 18 mcg into inhaler and inhale daily.   Yes Historical Provider, MD   Physical Exam: Filed Vitals:   07/04/13 2135  BP: 127/78  Pulse: 108  Temp:   Resp:      General:  Cooperative to examination, no acute distress, patient with difficulties completing full sentences.  Eyes: PERRLA, no icterus, extraocular muscles intact  ENT: Dentures in place, no erythema or exudates, patient without any  drainage out of his ears or nostrils  Neck: Supple, no thyromegaly, no bruits  Cardiovascular: S1 and S2, mild tachycardia, no murmurs, no gallops, no rubs  Respiratory: Decreased air movement, no wheezing, no crackles  Abdomen: Soft, nontender, nondistended, positive bowel sounds  Skin: no rash, no petechiae  Musculoskeletal: noted swelling of his toes and trace of pedal edema bilaterally; patient with tenderness to go patient on his thighs bilaterally (right more than left), also with decrease in strength pushing on his knees from sitting position do to ongoing pain in his thighs.  Psychiatric: stable mood, no suicidal ideation or hallucinations.  Neurologic: cranial nerves grossly intact, 3-4/5 lower extremity proximal weakness secondary to ongoing pain in his thighs, upper extremities strength 5 out of 5 bilaterally symmetrically, normal finger-to-nose, no sensory deficit.  Labs on Admission:  Basic Metabolic Panel:  Recent Labs Lab 07/04/13 1230  NA 136  K 4.3  CL 96  CO2 28  GLUCOSE 58*  BUN 19  CREATININE 1.24  CALCIUM 9.5   CBC:  Recent Labs Lab 07/04/13 1230  WBC 8.2  NEUTROABS 6.0  HGB 14.8  HCT 42.5  MCV 90.8  PLT 220   CBG:  Recent Labs Lab 07/04/13 1726  GLUCAP 84    Radiological Exams on Admission: Dg Chest 2 View  07/04/2013   CLINICAL DATA:  Hypertension.  EXAM: CHEST  2 VIEW  COMPARISON:  None.  FINDINGS: The cardiac silhouette is normal in size. Mediastinum is normal in contour. There are no hilar masses.  Reticular opacities are noted in the lung bases most likely scarring, subsegmental atelectasis or a combination. This is most prominent in the right lung base. Consider an infiltrate or bronchitis in the proper clinical setting.  The lungs are otherwise clear. No pleural effusion or pneumothorax.  The bony thorax is demineralized but intact.  IMPRESSION: Mild lung base opacity, greater on the right. This is most likely subsegmental  atelectasis and/ or chronic scarring. Consider an infiltrate or bronchitis at the right lung base if there are symptoms consistent with this.   Electronically Signed   By: Amie Portland   On: 07/04/2013 16:29   Dg Lumbar Spine Complete  07/04/2013   CLINICAL DATA:  Fall, low back pain  EXAM: LUMBAR SPINE - COMPLETE 4+ VIEW  COMPARISON:  CT abdomen pelvis dated 01/28/2012  FINDINGS: Five lumbar type vertebral bodies.  Straightening of the lumbar spine.  Mild superior endplate compression deformity at L2, chronic.  Vertebral body heights are otherwise maintained.  Moderate multilevel degenerative changes.  Visualized bony pelvis appears intact.  Vascular calcifications.  IMPRESSION: Mild superior endplate compression deformity at L2, chronic.  Moderate multilevel degenerative changes.   Electronically Signed   By: Charline Bills M.D.   On: 07/04/2013 14:08   Dg Hip Complete Right  07/04/2013   CLINICAL DATA:  Fall, right hip pain  EXAM: RIGHT HIP - COMPLETE 2+ VIEW  COMPARISON:  None.  FINDINGS: No fracture or dislocation is seen.  Bilateral  hip joints are symmetric.  Visualized bony pelvis appears intact.  Degenerative changes of the lower lumbar spine.  Vascular calcifications.  IMPRESSION: No fracture or dislocation is seen.   Electronically Signed   By: Charline Bills M.D.   On: 07/04/2013 14:06   Mr Lumbar Spine Wo Contrast  07/04/2013   CLINICAL DATA:  72 year old male status post fall with severe pain radiating to the right hip. Cannot walk or bear weight.  EXAM: MRI LUMBAR SPINE WITHOUT CONTRAST  TECHNIQUE: Multiplanar, multisequence MR imaging was performed. No intravenous contrast was administered.  COMPARISON:  Lumbar radiographs 07/04/2013. [facility] CT Abdomen and Pelvis 01/28/2012.  FINDINGS: Normal lumbar segmentation seen on comparisons. Chronic L2 compression fracture. There is mild marrow edema in the bilateral far lateral L2 and L3 vertebral bodies. No L3 loss of  vertebral body height. See also disc level findings below.  Stable vertebral height and alignment elsewhere with chronic straightening of cervical lordosis. Trace marrow edema at the L5 superior endplate appears to be associated with a chronic Schmorl's node.  There is moderate marrow edema of the right L4 pedicle (series 6, image 6 and series 4, image 6 and series 8, image 20). No displaced pedicle fracture is evident.  Visible lower thoracic levels appear intact.  Scattered mild increased T2 and STIR signal in the erector spinae muscles bilaterally. These could be sequelae of trauma or altered biomechanics.  Visualized lower thoracic spinal cord is normal with conus medularis at T12-L1. Bb negative visualized abdominal viscera. The bladder appears mildly distended.  T11-T12: Disc bulge and posterior element hypertrophy. No definite spinal stenosis. Bilateral foraminal stenosis appears greater on the left but is not well evaluated due to motion.  T12-L1: Negative.  L1-L2: Right eccentric circumferential disc bulge mild facet hypertrophy. Mild epidural lipomatosis. Mild spinal stenosis and right L1 foraminal stenosis.  L2-L3: Severe disc space loss. Circumferential disc osteophyte complex. Epidural lipomatosis and mild to moderate facet and ligament flavum hypertrophy. Severe spinal stenosis. Severe left and moderate right L2 foraminal stenosis.  L3-L4: Right eccentric circumferential disc bulge. Epidural lipomatosis. Mild facet and ligament flavum hypertrophy. Moderate spinal stenosis.  L4-L5: Circumferential disc bulge eccentric to the right. Moderate facet and ligament flavum hypertrophy greater on the right. Epidural lipomatosis. Moderate to severe spinal stenosis. Severe right L4 foraminal stenosis.  L5-S1: Circumferential disc osteophyte complex with small left paracentral disc protrusion. Epidural lipomatosis. Mild facet hypertrophy. Mild spinal stenosis with mild to moderate left lateral recess stenosis.  Moderate to severe bilateral L5 foraminal stenosis.  IMPRESSION: 1. With regard to recent injury:  - there is focal marrow edema in the right L4 pedicle which may reflect a nondisplaced isolated pedicle fracture.  - there is mild marrow edema in mid L2 and L3 vertebral bodies suggestive of bone contusions. Chronic L2 compression fracture is stable and no new loss of lumbar vertebral body height or alignment is demonstrated.  - there is scattered increased signal in the bilateral erector spinae muscles which could reflect injury or altered biomechanics related to the above.  2. Severe multifactorial spinal stenosis at L2-L3. Moderate to severe spinal stenosis at L4-L5 with severe right foraminal stenosis. Moderate spinal stenosis at L3-L4 and mild lumbar spinal stenosis elsewhere.  3. Moderate or severe neural foraminal stenosis at the bilateral L2, right L4, and bilateral L5 nerve levels.   Electronically Signed   By: Augusto Gamble M.D.   On: 07/04/2013 19:22   Mr Hip Right Wo Contrast  07/04/2013  CLINICAL DATA:  Larey Seat. Right hip pain.  EXAM: MRI OF THE RIGHT HIP WITHOUT CONTRAST  TECHNIQUE: Multiplanar, multisequence MR imaging was performed. No intravenous contrast was administered.  COMPARISON:  Right hip radiographs 07/04/2013.  FINDINGS: Exam limited by patient motion.  No obvious hip or pelvic fracture. The pubic symphysis and SI joints are intact. Both hips are normally located. Moderate degenerative changes bilaterally. No joint effusion. Subchondral cystic change noted in the right acetabulum.  There are acute muscle injuries involving the left pectineus and obturator externus muscles in the right obturator internus muscle.  No significant intrapelvic abnormalities. No inguinal mass or hernia.  IMPRESSION: No acute hip or pelvic fracture.  Adductor muscle injuries bilaterally   Electronically Signed   By: Loralie Champagne M.D.   On: 07/04/2013 19:17   Assessment/Plan 1-Thigh pain, musculoskeletal: With  concerns for myositis vs associated to statins versus inflammation from recent injury. -Will admit to MedSurg bed -Will check ESR, aldolase and CK level -Will give empiric pulse of steroids and ask physical therapy to evaluate and treat. -Patient might require outpatient rheumatology visit and possible muscle biopsy for further workup  2-degenerative disc disease: To be chronic. At this moment not causing any specific neurologic deficit. Will continue pain medication, physical rehabilitation and patient will benefit of outpatient evaluation by neurosurgery service.  3-H/O prostate cancer with BPH: Continue finasteride. Outpatient followup with urology service.  4-Diabetes mellitus: Without mention of complication and well controlled according to patient. Will check hemoglobin A1c. While in the hospital will hold glipizide and metformin and Will use sliding scale insulin. -Anticipate blood sugar to get out of control with the use of steroids.  5-HTN (hypertension): Stable. Will continue home medication (lisinopril and HCTZ).  6-History of tobacco abuse: Remote. Patient reports that he has stopped smoking about 8 years ago.  7-Fall: Mechanical fall in nature. Patient treated with the rug inside his house approximately one week and a half ago. No acute fractures on images studies performed in the ED. -Inflammation affecting both of his thighs; probably myositis vs a stat tenths in the use versus secondary to recent musculoskeletal injury  8-COPD and Hypoxia: Most likely secondary to underlying COPD and mild exacerbation; especially with his chronic history of tobacco abuse. Patient reports that at home sometimes he needs to stop just to catch his breath while doing any kind of a exertional activity. -Will provide oxygen supplementation to try to keep his oxygen saturation above 90% -Will check his needs for home oxygen -Patient will continue when necessary nebulizer treatments and will also start  him on Symbicort twice a day. -Will continue his spiriva -Patient will benefit of outpatient PFTs and to establish care with a pulmonologist -Patient will be started on Solu-Medrol as well.  9-HLD (hyperlipidemia): Will check lipid panel. Holding the statins at this moment in case that is also part of the cause for myalgia and myositis.  10-Depression: Continue Prozac  11-Chronic kidney disease: Most likely secondary to hypertension and diabetes. At this moment appears to be at baseline. Patient chronic kidney disease stage 2-3 base on his GFR.  DVT: Heparin.   Code Status: Full Family Communication: son, wife and daughter-in law at bedside Disposition Plan: LOS > 2 midnights, inpatient; med-surg bed  Time spent: 50 minutes  Kaila Devries [facility] Pager [phone]  If 7PM-7AM, please contact night-coverage [email or web address] Password [email or web address] 07/04/2013, 10:18 PM

## 2013-07-04 NOTE — ED Notes (Addendum)
Patient transported to MRI. Will be gone for approx 45-60 mins.

## 2013-07-04 NOTE — ED Notes (Signed)
Ambulation attempted.  Pt unable to roll out of bed or stand to his feet. MD Lynelle Doctor, and RN notified.

## 2013-07-04 NOTE — ED Provider Notes (Signed)
I assumed care of this patient from Dr. Lynelle Doctor.  MRI was pending. MRI shows evidence of muscular injury and inflammation but no evidence of acute fracture. I ambulated the patient. He required two-person assist. He also was noted to become hypoxic with ambulation. He has an extensive smoking history. Breath sounds were evaluation are clear without wheezes. Given his hypoxia during ambulation and continued pain and difficulty walking, patient will be admitted.  Shon Baton, MD 07/04/13 2108

## 2013-07-04 NOTE — ED Notes (Signed)
Pt is a CA pt states that he has had n/v and weakness to rt leg x1 week

## 2013-07-04 NOTE — ED Notes (Signed)
Patient transported to CT 

## 2013-07-04 NOTE — Progress Notes (Signed)
Discussed admission status with Dr. Gwenlyn Perking.

## 2013-07-04 NOTE — ED Notes (Signed)
MRI will be getting pt for scans in approx 

## 2013-07-05 DIAGNOSIS — J441 Chronic obstructive pulmonary disease with (acute) exacerbation: Secondary | ICD-10-CM

## 2013-07-05 DIAGNOSIS — M6282 Rhabdomyolysis: Principal | ICD-10-CM

## 2013-07-05 LAB — RAPID URINE DRUG SCREEN, HOSP PERFORMED
Amphetamines: NOT DETECTED
Barbiturates: NOT DETECTED
Benzodiazepines: NOT DETECTED
Cocaine: NOT DETECTED
Opiates: NOT DETECTED
Tetrahydrocannabinol: NOT DETECTED

## 2013-07-05 LAB — LIPID PANEL
Cholesterol: 121 mg/dL (ref 0–200)
HDL: 41 mg/dL (ref >39)
Total CHOL/HDL Ratio: 3 RATIO
Triglycerides: 122 mg/dL (ref <150)
VLDL: 24 mg/dL (ref 0–40)

## 2013-07-05 LAB — URINALYSIS W MICROSCOPIC + REFLEX CULTURE
Bilirubin Urine: NEGATIVE
Glucose, UA: 500 mg/dL — AB
Hgb urine dipstick: NEGATIVE
Leukocytes, UA: NEGATIVE
Nitrite: NEGATIVE
Specific Gravity, Urine: 1.024 (ref 1.005–1.030)
Urobilinogen, UA: 1 mg/dL (ref 0.0–1.0)
pH: 5.5 (ref 5.0–8.0)

## 2013-07-05 LAB — CBC
MCH: 31.5 pg (ref 26.0–34.0)
MCHC: 34.8 g/dL (ref 30.0–36.0)
MCV: 90.5 fL (ref 78.0–100.0)
Platelets: 193 10*3/uL (ref 150–400)
RBC: 4.1 MIL/uL — ABNORMAL LOW (ref 4.22–5.81)
RDW: 12.6 % (ref 11.5–15.5)

## 2013-07-05 LAB — CREATININE, SERUM
Creatinine, Ser: 1.19 mg/dL (ref 0.50–1.35)
GFR calc non Af Amer: 59 mL/min — ABNORMAL LOW (ref >90)

## 2013-07-05 LAB — COMPREHENSIVE METABOLIC PANEL
ALT: 19 U/L (ref 0–53)
AST: 38 U/L — ABNORMAL HIGH (ref 0–37)
Albumin: 3.5 g/dL (ref 3.5–5.2)
Alkaline Phosphatase: 60 U/L (ref 39–117)
BUN: 21 mg/dL (ref 6–23)
CO2: 27 mEq/L (ref 19–32)
Chloride: 96 mEq/L (ref 96–112)
Creatinine, Ser: 1.06 mg/dL (ref 0.50–1.35)
GFR calc non Af Amer: 68 mL/min — ABNORMAL LOW (ref >90)
Potassium: 4.3 mEq/L (ref 3.5–5.1)
Sodium: 132 mEq/L — ABNORMAL LOW (ref 135–145)
Total Bilirubin: 0.6 mg/dL (ref 0.3–1.2)
Total Protein: 6.7 g/dL (ref 6.0–8.3)

## 2013-07-05 LAB — HEMOGLOBIN A1C: Hgb A1c MFr Bld: 6.1 % — ABNORMAL HIGH (ref <5.7)

## 2013-07-05 LAB — GLUCOSE, CAPILLARY
Glucose-Capillary: 237 mg/dL — ABNORMAL HIGH (ref 70–99)
Glucose-Capillary: 257 mg/dL — ABNORMAL HIGH (ref 70–99)
Glucose-Capillary: 202 mg/dL — ABNORMAL HIGH (ref 70–99)

## 2013-07-05 LAB — TSH: TSH: 0.567 u[IU]/mL (ref 0.350–4.500)

## 2013-07-05 MED ORDER — ALBUTEROL SULFATE (5 MG/ML) 0.5% IN NEBU
2.5000 mg | INHALATION_SOLUTION | Freq: Four times a day (QID) | RESPIRATORY_TRACT | Status: DC
  Administered 2013-07-05 – 2013-07-06 (×3): 2.5 mg via RESPIRATORY_TRACT
  Filled 2013-07-05 (×3): qty 0.5

## 2013-07-05 MED ORDER — DIPHENHYDRAMINE HCL 50 MG/ML IJ SOLN
12.5000 mg | Freq: Four times a day (QID) | INTRAMUSCULAR | Status: DC | PRN
  Administered 2013-07-05: 12.5 mg via INTRAVENOUS
  Filled 2013-07-05: qty 1

## 2013-07-05 MED ORDER — METHYLPREDNISOLONE SODIUM SUCC 40 MG IJ SOLR
40.0000 mg | Freq: Three times a day (TID) | INTRAMUSCULAR | Status: DC
  Administered 2013-07-05 – 2013-07-06 (×4): 40 mg via INTRAVENOUS
  Filled 2013-07-05 (×5): qty 1

## 2013-07-05 NOTE — Care Management Note (Signed)
   CARE MANAGEMENT NOTE 07/05/2013  Patient:  [name] [name], [name] [name]   Account Number:  [id number]  Date Initiated:  07/05/2013  Documentation initiated by:  Shaquasia Caponigro  Subjective/Objective Assessment:   72 yo male admitted with thigh pain and COPD exacerbation.PCP: Hoyle Sauer, MD     Action/Plan:   Home when stable   Anticipated DC Date:     Anticipated DC Plan:  HOME W HOME HEALTH SERVICES      DC Planning Services  CM consult      Choice offered to / List presented to:  NA   DME arranged  NA      DME agency  NA     HH arranged  NA      HH agency  NA   Status of service:  In process, will continue to follow Medicare Important Message given?   (If response is "NO", the following Medicare IM given date fields will be blank) Date Medicare IM given:   Date Additional Medicare IM given:    Discharge Disposition:    Per UR Regulation:  Reviewed for med. necessity/level of care/duration of stay  If discussed at Long Length of Stay Meetings, dates discussed:    Comments:  07/05/13 1144 Vergie Zahm,RN,MSN [phone] Chart reviewed for utilization of services. PTA pt from home. Awaiting PT/OT to further assess dc needs. Pt currently on 2L n/c, may require home o2 at dc if unable to wean off oxygen.

## 2013-07-05 NOTE — Progress Notes (Signed)
Inpatient Diabetes Program Recommendations  AACE/ADA: New Consensus Statement on Inpatient Glycemic Control (2013)  Target Ranges:  Prepandial:   less than 140 mg/dL      Peak postprandial:   less than 180 mg/dL (1-2 hours)      Critically ill patients:  140 - 180 mg/dL    Results for DAMANTE, SPRAGG (MRN [id number]) as of 07/05/2013 13:24  Ref. Range 07/05/2013 07:30 07/05/2013 12:00  Glucose-Capillary Latest Range: 70-99 mg/dL 469 (H) 629 (H)    **Noted patient currently receiving IV steroids.  Glucose levels elevated.  **Noted Novolog Moderate SSI started today at 8am.  Patient/family refused dose of SSI this morning at 8am.  Patient finally received first dose of SSI at 12noon.  Will follow while inpatient. Ambrose Finland RN, MSN, CDE Diabetes Coordinator Inpatient Diabetes Program Team Pager: [phone] (8a-10p)

## 2013-07-05 NOTE — Evaluation (Signed)
Physical Therapy Evaluation Patient Details Name: [name] [name] MRN: [id number] DOB: Feb 22, 1941 Today's Date: 07/05/2013 Time: 4098-1191 PT Time Calculation (min): 24 min  PT Assessment / Plan / Recommendation History of Present Illness  [name] [name] is a 72 y.o. male with past medical history significant for hypertension, hyperlipidemia, diabetes and history of prostate cancer/BPH; came to the hospital complaining of bilateral lower extremity pain. Patient reports that he fail about week and a half ago at home. It was a mechanical fall. Since that time he started having pain affecting both hips and thighs  Clinical Impression      PT Assessment  Patient needs continued PT services    Follow Up Recommendations  Supervision/Assistance - 24 hour;SNF (if pt improves quickly, may be able to d/c to home with HHPT)    Does the patient have the potential to tolerate intense rehabilitation      Barriers to Discharge        Equipment Recommendations  Rolling walker with 5" wheels    Recommendations for Other Services OT consult   Frequency Min 5X/week    Precautions / Restrictions Precautions Precautions: Fall   Pertinent Vitals/Pain Pt limited by sharp pain in anterior thighs with standing attempts and dyspnea on exertion      Mobility  Bed Mobility Bed Mobility: Supine to Sit Supine to Sit: 4: Min assist Details for Bed Mobility Assistance: pt moves impulsively, but is able to swing legs around to sit on side of bed Transfers Transfers: Sit to Stand;Stand to Sit Sit to Stand: 3: Mod assist Stand to Sit: 3: Mod assist Details for Transfer Assistance: attempted sit to stand several times.  At first pt tended to move rapidely and almost threw himself back down onto the bed.  He stated he had too much pain in his thighs.  Second attempt was made to slow down and try to push up with arms and weight bear through legs.  He still was limited by pain.  repeated attemtps with other  strategies inculding putting on his shoes to help with foot placement.. Pt continued to be limited in amount of time he could stand by back pain. Ambulation/Gait Ambulation/Gait Assistance: 1: +2 Total assist Ambulation/Gait: Patient Percentage: 30% Ambulation Distance (Feet): 10 Feet Assistive device: Rolling walker Ambulation/Gait Assistance Details: Multimodal cues to slow down, take better step and foot placement with RLE   Nasal O2 at 2L maintained and O2 sats > 90%  but pt was dyspneic on exetion.  Gait Pattern: Step-to pattern;Decreased step length - right;Decreased step length - left;Decreased stance time - right;Decreased stance time - left;Decreased weight shift to right;Decreased weight shift to left Gait velocity: decreased General Gait Details: Pt kept both arms extended and pushed p heavily on RW lifting his body weight up so that he could advance legs Stairs: No Wheelchair Mobility Wheelchair Mobility: No    Exercises General Exercises - Lower Extremity Ankle Circles/Pumps: AROM;Right;Left;10 reps;Seated (needs cues to slow down) Long Arc Quad: AROM;Both;Seated;10 reps   PT Diagnosis: Difficulty walking;Abnormality of gait;Acute pain  PT Problem List: Decreased strength;Decreased activity tolerance;Pain;Decreased balance;Decreased mobility PT Treatment Interventions: Gait training;Functional mobility training;Therapeutic activities;Balance training;Patient/family education     PT Goals(Current goals can be found in the care plan section) Acute Rehab PT Goals Patient Stated Goal: Pt wants to go home with HHPT PT Goal Formulation: With patient Time For Goal Achievement: 07/19/13 Potential to Achieve Goals: Fair  Visit Information  Last PT Received On: 07/05/13 Assistance Needed: +2  History of Present Illness: [name] [name] is a 72 y.o. male with past medical history significant for hypertension, hyperlipidemia, diabetes and history of prostate cancer/BPH; came to the  hospital complaining of bilateral lower extremity pain. Patient reports that he fail about week and a half ago at home. It was a mechanical fall. Since that time he started having pain affecting both hips and thighs       Prior Functioning  Home Living Family/patient expects to be discharged to:: Private residence Living Arrangements: Spouse/significant other Available Help at Discharge: Family Type of Home: House Home Access: Stairs to enter Secretary/administrator of Steps: 3 Entrance Stairs-Rails: Right;Left Home Layout: One level Home Equipment: None Additional Comments: Pt reports he had lots of medical equipment for his wife who died from cancer 2 years ago. He has since given it all away and has remarried Prior Function Level of Independence: Independent Comments: pt states he was getting around the house scooting on a rolling chair Communication Communication: No difficulties    Cognition  Cognition Arousal/Alertness: Awake/alert Behavior During Therapy: Impulsive;Anxious (pt admits to being afraid he will fall) Overall Cognitive Status: Within Functional Limits for tasks assessed    Extremity/Trunk Assessment Lower Extremity Assessment Lower Extremity Assessment: RLE deficits/detail;LLE deficits/detail RLE Deficits / Details: decreased dorsiflexion ROM. Pt report RLE feels like it will give out on him with pain when he attempts to stand.  He has sharp pain in anterior thigh and difficulty with weight bearing and foot placement.  He states it "locks up" LLE Deficits / Details: pt c/o pain in left anterior thigh  LLE: Unable to fully assess due to pain Cervical / Trunk Assessment Cervical / Trunk Assessment: Other exceptions Cervical / Trunk Exceptions: anterior abdomen.  Pt reports he has had long history of back pain   Balance Balance Balance Assessed: Yes Static Sitting Balance Static Sitting - Balance Support: No upper extremity supported;Feet supported Static Sitting  - Level of Assistance: 7: Independent Static Standing Balance Static Standing - Balance Support: Bilateral upper extremity supported;During functional activity Static Standing - Level of Assistance: 3: Mod assist Static Standing - Comment/# of Minutes: 5 sec  End of Session PT - End of Session Equipment Utilized During Treatment: Gait belt;Oxygen Activity Tolerance: Patient limited by pain Patient left: in chair;with call bell/phone within reach Nurse Communication: Mobility status  GP    Bayard Hugger. [place], [place] [phone] 07/05/2013, 4:21 PM

## 2013-07-05 NOTE — Consult Note (Signed)
Reason for Consult: Lumbar spinal stenosis Referring Physician: Dr. Stephannie Peters Wilshire is an 72 y.o. male.  HPI: The patient is a 72 year old white male who took a fall about a week ago. He was admitted for rhabdomyolysis. He complained of some right leg pain and was worked up with a lumbar MRI. This demonstrated multilevel spinal stenosis and a neurosurgical consultation was requested.  Presently the patient is alert and pleasant. He admits to some right leg pain and weakness. He states he's been able to ambulate well and wants to go home. He denies back pain per se, and denies left leg pain or numbness. He has a history of prostate cancer.  Past Medical History  Diagnosis Date  . Cancer   . Hypertension   . Diabetes mellitus   . Hyperlipidemia     Past Surgical History  Procedure Laterality Date  . No past surgeries    . Cataract extraction w/ intraocular lens implant  2007    bilateral  . Shoulder open rotator cuff repair  2010    left    History reviewed. No pertinent family history.  Social History:  reports that he quit smoking about 8 years ago. He does not have any smokeless tobacco history on file. He reports that he does not use illicit drugs. His alcohol history is not on file.  Allergies: No Known Allergies  Medications:  I have reviewed the patient's current medications. Prior to Admission:  Prescriptions prior to admission  Medication Sig Dispense Refill  . albuterol (PROVENTIL) (2.5 MG/3ML) 0.083% nebulizer solution Take 2.5 mg by nebulization every 6 (six) hours as needed for wheezing or shortness of breath.       Marland Kitchen aspirin 325 MG tablet Take 325 mg by mouth daily.      . cholecalciferol (VITAMIN D) 1000 UNITS tablet Take 1,000 Units by mouth daily.      . finasteride (PROSCAR) 5 MG tablet Take 5 mg by mouth daily.      Marland Kitchen FLUoxetine (PROZAC) 40 MG capsule Take 40 mg by mouth every morning.      Marland Kitchen glipiZIDE (GLUCOTROL) 10 MG tablet Take 10 mg by mouth 2  (two) times daily before a meal.      . hydrochlorothiazide (HYDRODIURIL) 25 MG tablet Take 25 mg by mouth every morning.      . hydroxypropyl methylcellulose (ISOPTO TEARS) 2.5 % ophthalmic solution Place 1 drop into both eyes as needed (for dryness).       Marland Kitchen lisinopril (PRINIVIL,ZESTRIL) 20 MG tablet Take 10 mg by mouth every morning.      . metFORMIN (GLUCOPHAGE) 1000 MG tablet Take 1,000 mg by mouth 2 (two) times daily with a meal.      . simvastatin (ZOCOR) 40 MG tablet Take 20 mg by mouth at bedtime.      Marland Kitchen tiotropium (SPIRIVA) 18 MCG inhalation capsule Place 18 mcg into inhaler and inhale daily.       Scheduled: . albuterol  2.5 mg Nebulization Q6H  . aspirin  325 mg Oral Daily  . budesonide-formoterol  2 puff Inhalation BID  . finasteride  5 mg Oral Daily  . FLUoxetine  40 mg Oral q morning - 10a  . heparin  5,000 Units Subcutaneous Q8H  . hydrochlorothiazide  25 mg Oral q morning - 10a  . insulin aspart  0-15 Units Subcutaneous TID WC  . insulin aspart  0-5 Units Subcutaneous QHS  . lisinopril  5 mg Oral q morning -  10a  . methylPREDNISolone (SOLU-MEDROL) injection  40 mg Intravenous Q8H  . pantoprazole  40 mg Oral Q1200  . tiotropium  18 mcg Inhalation Daily   Continuous: . sodium chloride 100 mL/hr at 07/05/13 1820   ZOX:[id number], acetaminophen, albuterol, diphenhydrAMINE, HYDROmorphone (DILAUDID) injection, ondansetron (ZOFRAN) IV, ondansetron, oxyCODONE, polyvinyl alcohol Anti-infectives   None       Results for orders placed during the hospital encounter of 07/04/13 (from the past 48 hour(s))  CBC WITH DIFFERENTIAL     Status: None   Collection Time    07/04/13 12:30 PM      Result Value Range   WBC 8.2  4.0 - 10.5 K/uL   RBC 4.68  4.22 - 5.81 MIL/uL   Hemoglobin 14.8  13.0 - 17.0 g/dL   HCT 14.7  82.9 - 56.2 %   MCV 90.8  78.0 - 100.0 fL   MCH 31.6  26.0 - 34.0 pg   MCHC 34.8  30.0 - 36.0 g/dL   RDW 13.0  86.5 - 78.4 %   Platelets 220  150 - 400 K/uL    Neutrophils Relative % 74  43 - 77 %   Neutro Abs 6.0  1.7 - 7.7 K/uL   Lymphocytes Relative 14  12 - 46 %   Lymphs Abs 1.2  0.7 - 4.0 K/uL   Monocytes Relative 8  3 - 12 %   Monocytes Absolute 0.6  0.1 - 1.0 K/uL   Eosinophils Relative 4  0 - 5 %   Eosinophils Absolute 0.3  0.0 - 0.7 K/uL   Basophils Relative 1  0 - 1 %   Basophils Absolute 0.1  0.0 - 0.1 K/uL  BASIC METABOLIC PANEL     Status: Abnormal   Collection Time    07/04/13 12:30 PM      Result Value Range   Sodium 136  135 - 145 mEq/L   Potassium 4.3  3.5 - 5.1 mEq/L   Chloride 96  96 - 112 mEq/L   CO2 28  19 - 32 mEq/L   Glucose, Bld 58 (*) 70 - 99 mg/dL   BUN 19  6 - 23 mg/dL   Creatinine, Ser 6.96  0.50 - 1.35 mg/dL   Calcium 9.5  8.4 - 29.5 mg/dL   GFR calc non Af Amer 56 (*) >90 mL/min   GFR calc Af Amer 65 (*) >90 mL/min   Comment: (NOTE)     The eGFR has been calculated using the CKD EPI equation.     This calculation has not been validated in all clinical situations.     eGFR's persistently <90 mL/min signify possible Chronic Kidney     Disease.  GLUCOSE, CAPILLARY     Status: None   Collection Time    07/04/13  5:26 PM      Result Value Range   Glucose-Capillary 84  70 - 99 mg/dL  GLUCOSE, CAPILLARY     Status: Abnormal   Collection Time    07/04/13 10:24 PM      Result Value Range   Glucose-Capillary 208 (*) 70 - 99 mg/dL   Comment 1 Notify RN    CK     Status: Abnormal   Collection Time    07/04/13 11:40 PM      Result Value Range   Total CK 1433 (*) 7 - 232 U/L  SEDIMENTATION RATE     Status: None   Collection Time    07/04/13 11:40 PM  Result Value Range   Sed Rate 15  0 - 16 mm/hr  CBC     Status: Abnormal   Collection Time    07/04/13 11:40 PM      Result Value Range   WBC 8.4  4.0 - 10.5 K/uL   RBC 4.10 (*) 4.22 - 5.81 MIL/uL   Hemoglobin 12.9 (*) 13.0 - 17.0 g/dL   HCT 16.1 (*) 09.6 - 04.5 %   MCV 90.5  78.0 - 100.0 fL   MCH 31.5  26.0 - 34.0 pg   MCHC 34.8  30.0 - 36.0  g/dL   RDW 40.9  81.1 - 91.4 %   Platelets 193  150 - 400 K/uL  CREATININE, SERUM     Status: Abnormal   Collection Time    07/04/13 11:40 PM      Result Value Range   Creatinine, Ser 1.19  0.50 - 1.35 mg/dL   GFR calc non Af Amer 59 (*) >90 mL/min   GFR calc Af Amer 68 (*) >90 mL/min   Comment: (NOTE)     The eGFR has been calculated using the CKD EPI equation.     This calculation has not been validated in all clinical situations.     eGFR's persistently <90 mL/min signify possible Chronic Kidney     Disease.  TSH     Status: None   Collection Time    07/04/13 11:40 PM      Result Value Range   TSH 0.567  0.350 - 4.500 uIU/mL   Comment: Performed at [facility]  HEMOGLOBIN A1C     Status: Abnormal   Collection Time    07/04/13 11:40 PM      Result Value Range   Hemoglobin A1C 6.1 (*) <5.7 %   Comment: (NOTE)                                                                               According to the ADA Clinical Practice Recommendations for 2011, when     HbA1c is used as a screening test:      >=6.5%   Diagnostic of Diabetes Mellitus               (if abnormal result is confirmed)     5.7-6.4%   Increased risk of developing Diabetes Mellitus     References:Diagnosis and Classification of Diabetes Mellitus,Diabetes     Care,2011,34(Suppl 1):S62-S69 and Standards of Medical Care in             Diabetes - 2011,Diabetes Care,2011,34 (Suppl 1):S11-S61.   Mean Plasma Glucose 128 (*) <117 mg/dL   Comment: Performed at [facility]  LIPID PANEL     Status: None   Collection Time    07/05/13  3:45 AM      Result Value Range   Cholesterol 121  0 - 200 mg/dL   Triglycerides 782  <956 mg/dL   HDL 41  >21 mg/dL   Total CHOL/HDL Ratio 3.0     VLDL 24  0 - 40 mg/dL   LDL Cholesterol 56  0 - 99 mg/dL   Comment:  Total Cholesterol/HDL:CHD Risk     Coronary Heart Disease Risk Table                         Men   Women      1/2 Average Risk   3.4   3.3       Average Risk       5.0   4.4      2 X Average Risk   9.6   7.1      3 X Average Risk  23.4   11.0                Use the calculated Patient Ratio     above and the CHD Risk Table     to determine the patient's CHD Risk.                ATP III CLASSIFICATION (LDL):      <100     mg/dL   Optimal      098-119  mg/dL   Near or Above                        Optimal      130-159  mg/dL   Borderline      147-829  mg/dL   High      >562     mg/dL   Very High     Performed at [facility]  GLUCOSE, CAPILLARY     Status: Abnormal   Collection Time    07/05/13  7:30 AM      Result Value Range   Glucose-Capillary 237 (*) 70 - 99 mg/dL  COMPREHENSIVE METABOLIC PANEL     Status: Abnormal   Collection Time    07/05/13 11:05 AM      Result Value Range   Sodium 132 (*) 135 - 145 mEq/L   Potassium 4.3  3.5 - 5.1 mEq/L   Chloride 96  96 - 112 mEq/L   CO2 27  19 - 32 mEq/L   Glucose, Bld 262 (*) 70 - 99 mg/dL   BUN 21  6 - 23 mg/dL   Creatinine, Ser 1.30  0.50 - 1.35 mg/dL   Calcium 8.5  8.4 - 86.5 mg/dL   Total Protein 6.7  6.0 - 8.3 g/dL   Albumin 3.5  3.5 - 5.2 g/dL   AST 38 (*) 0 - 37 U/L   ALT 19  0 - 53 U/L   Alkaline Phosphatase 60  39 - 117 U/L   Total Bilirubin 0.6  0.3 - 1.2 mg/dL   GFR calc non Af Amer 68 (*) >90 mL/min   GFR calc Af Amer 78 (*) >90 mL/min   Comment: (NOTE)     The eGFR has been calculated using the CKD EPI equation.     This calculation has not been validated in all clinical situations.     eGFR's persistently <90 mL/min signify possible Chronic Kidney     Disease.  GLUCOSE, CAPILLARY     Status: Abnormal   Collection Time    07/05/13 12:00 PM      Result Value Range   Glucose-Capillary 257 (*) 70 - 99 mg/dL  GLUCOSE, CAPILLARY     Status: Abnormal   Collection Time    07/05/13  4:19 PM      Result Value Range   Glucose-Capillary 202 (*) 70 - 99 mg/dL    Dg  Chest 2 View  07/04/2013   CLINICAL DATA:  Hypertension.  EXAM: CHEST  2 VIEW   COMPARISON:  None.  FINDINGS: The cardiac silhouette is normal in size. Mediastinum is normal in contour. There are no hilar masses.  Reticular opacities are noted in the lung bases most likely scarring, subsegmental atelectasis or a combination. This is most prominent in the right lung base. Consider an infiltrate or bronchitis in the proper clinical setting.  The lungs are otherwise clear. No pleural effusion or pneumothorax.  The bony thorax is demineralized but intact.  IMPRESSION: Mild lung base opacity, greater on the right. This is most likely subsegmental atelectasis and/ or chronic scarring. Consider an infiltrate or bronchitis at the right lung base if there are symptoms consistent with this.   Electronically Signed   By: Amie Portland   On: 07/04/2013 16:29   Dg Lumbar Spine Complete  07/04/2013   CLINICAL DATA:  Fall, low back pain  EXAM: LUMBAR SPINE - COMPLETE 4+ VIEW  COMPARISON:  CT abdomen pelvis dated 01/28/2012  FINDINGS: Five lumbar type vertebral bodies.  Straightening of the lumbar spine.  Mild superior endplate compression deformity at L2, chronic.  Vertebral body heights are otherwise maintained.  Moderate multilevel degenerative changes.  Visualized bony pelvis appears intact.  Vascular calcifications.  IMPRESSION: Mild superior endplate compression deformity at L2, chronic.  Moderate multilevel degenerative changes.   Electronically Signed   By: Charline Bills M.D.   On: 07/04/2013 14:08   Dg Hip Complete Right  07/04/2013   CLINICAL DATA:  Fall, right hip pain  EXAM: RIGHT HIP - COMPLETE 2+ VIEW  COMPARISON:  None.  FINDINGS: No fracture or dislocation is seen.  Bilateral hip joints are symmetric.  Visualized bony pelvis appears intact.  Degenerative changes of the lower lumbar spine.  Vascular calcifications.  IMPRESSION: No fracture or dislocation is seen.   Electronically Signed   By: Charline Bills M.D.   On: 07/04/2013 14:06   Mr Lumbar Spine Wo Contrast  07/04/2013    CLINICAL DATA:  72 year old male status post fall with severe pain radiating to the right hip. Cannot walk or bear weight.  EXAM: MRI LUMBAR SPINE WITHOUT CONTRAST  TECHNIQUE: Multiplanar, multisequence MR imaging was performed. No intravenous contrast was administered.  COMPARISON:  Lumbar radiographs 07/04/2013. [facility] CT Abdomen and Pelvis 01/28/2012.  FINDINGS: Normal lumbar segmentation seen on comparisons. Chronic L2 compression fracture. There is mild marrow edema in the bilateral far lateral L2 and L3 vertebral bodies. No L3 loss of vertebral body height. See also disc level findings below.  Stable vertebral height and alignment elsewhere with chronic straightening of cervical lordosis. Trace marrow edema at the L5 superior endplate appears to be associated with a chronic Schmorl's node.  There is moderate marrow edema of the right L4 pedicle (series 6, image 6 and series 4, image 6 and series 8, image 20). No displaced pedicle fracture is evident.  Visible lower thoracic levels appear intact.  Scattered mild increased T2 and STIR signal in the erector spinae muscles bilaterally. These could be sequelae of trauma or altered biomechanics.  Visualized lower thoracic spinal cord is normal with conus medularis at T12-L1. Bb negative visualized abdominal viscera. The bladder appears mildly distended.  T11-T12: Disc bulge and posterior element hypertrophy. No definite spinal stenosis. Bilateral foraminal stenosis appears greater on the left but is not well evaluated due to motion.  T12-L1: Negative.  L1-L2: Right eccentric circumferential disc bulge mild facet  hypertrophy. Mild epidural lipomatosis. Mild spinal stenosis and right L1 foraminal stenosis.  L2-L3: Severe disc space loss. Circumferential disc osteophyte complex. Epidural lipomatosis and mild to moderate facet and ligament flavum hypertrophy. Severe spinal stenosis. Severe left and moderate right L2 foraminal stenosis.  L3-L4:  Right eccentric circumferential disc bulge. Epidural lipomatosis. Mild facet and ligament flavum hypertrophy. Moderate spinal stenosis.  L4-L5: Circumferential disc bulge eccentric to the right. Moderate facet and ligament flavum hypertrophy greater on the right. Epidural lipomatosis. Moderate to severe spinal stenosis. Severe right L4 foraminal stenosis.  L5-S1: Circumferential disc osteophyte complex with small left paracentral disc protrusion. Epidural lipomatosis. Mild facet hypertrophy. Mild spinal stenosis with mild to moderate left lateral recess stenosis. Moderate to severe bilateral L5 foraminal stenosis.  IMPRESSION: 1. With regard to recent injury:  - there is focal marrow edema in the right L4 pedicle which may reflect a nondisplaced isolated pedicle fracture.  - there is mild marrow edema in mid L2 and L3 vertebral bodies suggestive of bone contusions. Chronic L2 compression fracture is stable and no new loss of lumbar vertebral body height or alignment is demonstrated.  - there is scattered increased signal in the bilateral erector spinae muscles which could reflect injury or altered biomechanics related to the above.  2. Severe multifactorial spinal stenosis at L2-L3. Moderate to severe spinal stenosis at L4-L5 with severe right foraminal stenosis. Moderate spinal stenosis at L3-L4 and mild lumbar spinal stenosis elsewhere.  3. Moderate or severe neural foraminal stenosis at the bilateral L2, right L4, and bilateral L5 nerve levels.   Electronically Signed   By: Augusto Gamble M.D.   On: 07/04/2013 19:22   Mr Hip Right Wo Contrast  07/04/2013   CLINICAL DATA:  Larey Seat. Right hip pain.  EXAM: MRI OF THE RIGHT HIP WITHOUT CONTRAST  TECHNIQUE: Multiplanar, multisequence MR imaging was performed. No intravenous contrast was administered.  COMPARISON:  Right hip radiographs 07/04/2013.  FINDINGS: Exam limited by patient motion.  No obvious hip or pelvic fracture. The pubic symphysis and SI joints are intact.  Both hips are normally located. Moderate degenerative changes bilaterally. No joint effusion. Subchondral cystic change noted in the right acetabulum.  There are acute muscle injuries involving the left pectineus and obturator externus muscles in the right obturator internus muscle.  No significant intrapelvic abnormalities. No inguinal mass or hernia.  IMPRESSION: No acute hip or pelvic fracture.  Adductor muscle injuries bilaterally   Electronically Signed   By: Loralie Champagne M.D.   On: 07/04/2013 19:17    Review of systems: As above Blood pressure 151/70, pulse 84, temperature 98.2 F (36.8 C), temperature source Oral, resp. rate 20, height 6' (1.829 m), weight 98.9 kg (218 lb 0.6 oz), SpO2 92.00%. Physical exam:  General: An alert and pleasant 72 year old white male in no apparent distress.  HEENT: Normocephalic  Neck: Supple without obvious deformities. Spurling's testing is negative, Lhermitte sign was not present.  Thorax: Symmetric  Abdomen: Soft and obese  Back exam: There is no point tenderness or deformities. Faber's testing is negative bilaterally, straight leg raise testing is negative bilaterally  Neurologic exam: The patient is alert and oriented x3. Cranial nerves II through XII are examined bilaterally and grossly normal. His motor strength is 5 over 5 his bile biceps, triceps, deltoids, hand grip, psoas, quadriceps, gastrocnemius. He has slight weakness in his bilateral extensor hallucis longus/dorsiflexors at 4+ over 5. Cerebellar exam is intact to rapid only movements of the upper extremities bilaterally. Sensory exam  is intact to light touch sensation all tested dermatomes bilaterally  I reviewed the patient's lumbar MRI performed at [facility]. It demonstrates the patient is a chronic appearing L2 compression fracture. The patient has multifactorial spinal stenosis at L2-3, L3-4 and L4-5.    Assessment/Plan: Lumbar spinal stenosis, lumbago, lumbar  radiculopathy: The patient is improving clinically. He does not need to have this addressed acutely. Please have the patient followup with me in the office in about a month. I discussed situation with the patient and answered all his questions.  Kaedan Richert D 07/05/2013, 9:30 PM

## 2013-07-05 NOTE — Progress Notes (Signed)
Patient on IV steroids which in turn is causing his blood sugars to increase. Patient on sliding scale insulin but refuses to take it despite education on how steroids can increase blood sugars. Patient states that he takes metformin at home and has some here that he can take. Patient educated that he can not take his home medications but that I would let the physician know so we can get that potentially ordered for him here in the hospital. Will continue to monitor.

## 2013-07-05 NOTE — Progress Notes (Signed)
[facility] PROGRESS NOTE  [name] [name] XLK:[id number] DOB: Jan 16, 1941 DOA: 07/04/2013 PCP: Hoyle Sauer, MD  Assessment/Plan: Rhabdomyolysis -CPK 1433 the time of admission -Followup aldolase -Likely multifactorial from alcohol, mechanical fall, and statin -Continue IV fluids -Urine drug screen -Trend CPK -Muscle biopsy if no improvement -Patient endorses 40-50% improvement today -ESR 15, TSH 0.567 -PT eval -MRI right hip negative for fracture COPD exacerbation -Continue IV Solu-Medrol -Continue aerosolized albuterol -Continue Spiriva -Added long-acting beta agonist Spinal stenosis -MRI lumbar spine showed nondisplaced L4 pedicle fracture, L2+L3 vertebral body contusions and L2 chronic compression fraction and L2-5 spinal stenosis -Clinically, patient has no focal deficit and has minimal back pain -Will need outpatient neurosurgical followup -Consider lumbar corset if worsen pain History of alcohol abuse -Patient states that he drank again approximately 2-3 weeks ago, but has not drank since then. Prostate cancer -f/u Dr. Alma Friendly as outpt -Continue finasteride Diabetes mellitus type 2 -Hemoglobin A1c 6.1 -NovoLog sliding scale for now -Hold metformin and glipizide -continue ASA Hyperlipidemia -Discontinue simvastatin do to elevated CPK -Total cholesterol 121, LDL 56, HDL 41 CKD stage 2 -renal function stable -baseline creatinine 1.2 after discussion with Dr. Felipa Eth Family Communication:   wife at beside Disposition Plan:   Home when medically stable       Procedures/Studies: Dg Chest 2 View  07/04/2013   CLINICAL DATA:  Hypertension.  EXAM: CHEST  2 VIEW  COMPARISON:  None.  FINDINGS: The cardiac silhouette is normal in size. Mediastinum is normal in contour. There are no hilar masses.  Reticular opacities are noted in the lung bases most likely scarring, subsegmental atelectasis or a combination. This is most prominent in the right lung base.  Consider an infiltrate or bronchitis in the proper clinical setting.  The lungs are otherwise clear. No pleural effusion or pneumothorax.  The bony thorax is demineralized but intact.  IMPRESSION: Mild lung base opacity, greater on the right. This is most likely subsegmental atelectasis and/ or chronic scarring. Consider an infiltrate or bronchitis at the right lung base if there are symptoms consistent with this.   Electronically Signed   By: Amie Portland   On: 07/04/2013 16:29   Dg Lumbar Spine Complete  07/04/2013   CLINICAL DATA:  Fall, low back pain  EXAM: LUMBAR SPINE - COMPLETE 4+ VIEW  COMPARISON:  CT abdomen pelvis dated 01/28/2012  FINDINGS: Five lumbar type vertebral bodies.  Straightening of the lumbar spine.  Mild superior endplate compression deformity at L2, chronic.  Vertebral body heights are otherwise maintained.  Moderate multilevel degenerative changes.  Visualized bony pelvis appears intact.  Vascular calcifications.  IMPRESSION: Mild superior endplate compression deformity at L2, chronic.  Moderate multilevel degenerative changes.   Electronically Signed   By: Charline Bills M.D.   On: 07/04/2013 14:08   Dg Hip Complete Right  07/04/2013   CLINICAL DATA:  Fall, right hip pain  EXAM: RIGHT HIP - COMPLETE 2+ VIEW  COMPARISON:  None.  FINDINGS: No fracture or dislocation is seen.  Bilateral hip joints are symmetric.  Visualized bony pelvis appears intact.  Degenerative changes of the lower lumbar spine.  Vascular calcifications.  IMPRESSION: No fracture or dislocation is seen.   Electronically Signed   By: Charline Bills M.D.   On: 07/04/2013 14:06   Mr Lumbar Spine Wo Contrast  07/04/2013   CLINICAL DATA:  72 year old male status post fall with severe pain radiating to the right hip. Cannot walk or bear weight.  EXAM: MRI LUMBAR SPINE WITHOUT  CONTRAST  TECHNIQUE: Multiplanar, multisequence MR imaging was performed. No intravenous contrast was administered.  COMPARISON:  Lumbar  radiographs 07/04/2013. [facility] CT Abdomen and Pelvis 01/28/2012.  FINDINGS: Normal lumbar segmentation seen on comparisons. Chronic L2 compression fracture. There is mild marrow edema in the bilateral far lateral L2 and L3 vertebral bodies. No L3 loss of vertebral body height. See also disc level findings below.  Stable vertebral height and alignment elsewhere with chronic straightening of cervical lordosis. Trace marrow edema at the L5 superior endplate appears to be associated with a chronic Schmorl's node.  There is moderate marrow edema of the right L4 pedicle (series 6, image 6 and series 4, image 6 and series 8, image 20). No displaced pedicle fracture is evident.  Visible lower thoracic levels appear intact.  Scattered mild increased T2 and STIR signal in the erector spinae muscles bilaterally. These could be sequelae of trauma or altered biomechanics.  Visualized lower thoracic spinal cord is normal with conus medularis at T12-L1. Bb negative visualized abdominal viscera. The bladder appears mildly distended.  T11-T12: Disc bulge and posterior element hypertrophy. No definite spinal stenosis. Bilateral foraminal stenosis appears greater on the left but is not well evaluated due to motion.  T12-L1: Negative.  L1-L2: Right eccentric circumferential disc bulge mild facet hypertrophy. Mild epidural lipomatosis. Mild spinal stenosis and right L1 foraminal stenosis.  L2-L3: Severe disc space loss. Circumferential disc osteophyte complex. Epidural lipomatosis and mild to moderate facet and ligament flavum hypertrophy. Severe spinal stenosis. Severe left and moderate right L2 foraminal stenosis.  L3-L4: Right eccentric circumferential disc bulge. Epidural lipomatosis. Mild facet and ligament flavum hypertrophy. Moderate spinal stenosis.  L4-L5: Circumferential disc bulge eccentric to the right. Moderate facet and ligament flavum hypertrophy greater on the right. Epidural lipomatosis. Moderate  to severe spinal stenosis. Severe right L4 foraminal stenosis.  L5-S1: Circumferential disc osteophyte complex with small left paracentral disc protrusion. Epidural lipomatosis. Mild facet hypertrophy. Mild spinal stenosis with mild to moderate left lateral recess stenosis. Moderate to severe bilateral L5 foraminal stenosis.  IMPRESSION: 1. With regard to recent injury:  - there is focal marrow edema in the right L4 pedicle which may reflect a nondisplaced isolated pedicle fracture.  - there is mild marrow edema in mid L2 and L3 vertebral bodies suggestive of bone contusions. Chronic L2 compression fracture is stable and no new loss of lumbar vertebral body height or alignment is demonstrated.  - there is scattered increased signal in the bilateral erector spinae muscles which could reflect injury or altered biomechanics related to the above.  2. Severe multifactorial spinal stenosis at L2-L3. Moderate to severe spinal stenosis at L4-L5 with severe right foraminal stenosis. Moderate spinal stenosis at L3-L4 and mild lumbar spinal stenosis elsewhere.  3. Moderate or severe neural foraminal stenosis at the bilateral L2, right L4, and bilateral L5 nerve levels.   Electronically Signed   By: Augusto Gamble M.D.   On: 07/04/2013 19:22   Mr Hip Right Wo Contrast  07/04/2013   CLINICAL DATA:  Larey Seat. Right hip pain.  EXAM: MRI OF THE RIGHT HIP WITHOUT CONTRAST  TECHNIQUE: Multiplanar, multisequence MR imaging was performed. No intravenous contrast was administered.  COMPARISON:  Right hip radiographs 07/04/2013.  FINDINGS: Exam limited by patient motion.  No obvious hip or pelvic fracture. The pubic symphysis and SI joints are intact. Both hips are normally located. Moderate degenerative changes bilaterally. No joint effusion. Subchondral cystic change noted in the right acetabulum.  There are acute  muscle injuries involving the left pectineus and obturator externus muscles in the right obturator internus muscle.  No  significant intrapelvic abnormalities. No inguinal mass or hernia.  IMPRESSION: No acute hip or pelvic fracture.  Adductor muscle injuries bilaterally   Electronically Signed   By: Loralie Champagne M.D.   On: 07/04/2013 19:17         Subjective: patient states that his leg weakness is 40-50% better today. Denies any fevers, chills, chest discomfort, nausea, vomiting, diarrhea, abdominal pain, dysuria or hematuria. Feels that his breathing is a little bit better than yesterday, But still has some dyspnea on exertion.   Objective: Filed Vitals:   07/04/13 2135 07/04/13 2238 07/05/13 0613 07/05/13 0730  BP: 127/78 140/59 151/78   Pulse: 108 104 81   Temp:  98.3 F (36.8 C) 98 F (36.7 C)   TempSrc:  Oral Oral   Resp:  16 20   Height:  6' (1.829 m)    Weight:  98.9 kg (218 lb 0.6 oz)    SpO2: 89% 92% 93% 93%    Intake/Output Summary (Last 24 hours) at 07/05/13 0948 Last data filed at 07/05/13 0615  Gross per 24 hour  Intake    120 ml  Output    800 ml  Net   -680 ml   Weight change:  Exam:   General:  Pt is alert, follows commands appropriately, not in acute distress  HEENT: No icterus, No thrush,  [place]/AT  Cardiovascular: RRR, S1/S2, no rubs, no gallops  Respiratory: mild bilateral wheeze. Good air movement. No rhonchi.  Abdomen: Soft/+BS, non tender, non distended, no guarding  Extremities: No edema, No lymphangitis, No petechiae, No rashes, no synovitis  Data Reviewed: Basic Metabolic Panel:  Recent Labs Lab 07/04/13 1230 07/04/13 2340  NA 136  --   K 4.3  --   CL 96  --   CO2 28  --   GLUCOSE 58*  --   BUN 19  --   CREATININE 1.24 1.19  CALCIUM 9.5  --    Liver Function Tests: No results found for this basename: AST, ALT, ALKPHOS, BILITOT, PROT, ALBUMIN,  in the last 168 hours No results found for this basename: LIPASE, AMYLASE,  in the last 168 hours No results found for this basename: AMMONIA,  in the last 168 hours CBC:  Recent Labs Lab  07/04/13 1230 07/04/13 2340  WBC 8.2 8.4  NEUTROABS 6.0  --   HGB 14.8 12.9*  HCT 42.5 37.1*  MCV 90.8 90.5  PLT 220 193   Cardiac Enzymes:  Recent Labs Lab 07/04/13 2340  CKTOTAL 1433*   BNP: No components found with this basename: POCBNP,  CBG:  Recent Labs Lab 07/04/13 1726 07/04/13 2224 07/05/13 0730  GLUCAP 84 208* 237*    No results found for this or any previous visit (from the past 240 hour(s)).   Scheduled Meds: . aspirin  325 mg Oral Daily  . budesonide-formoterol  2 puff Inhalation BID  . finasteride  5 mg Oral Daily  . FLUoxetine  40 mg Oral q morning - 10a  . heparin  5,000 Units Subcutaneous Q8H  . hydrochlorothiazide  25 mg Oral q morning - 10a  . insulin aspart  0-15 Units Subcutaneous TID WC  . insulin aspart  0-5 Units Subcutaneous QHS  . lisinopril  5 mg Oral q morning - 10a  . methylPREDNISolone (SOLU-MEDROL) injection  40 mg Intravenous Q12H  . pantoprazole  40 mg Oral Q1200  .  tiotropium  18 mcg Inhalation Daily   Continuous Infusions: . sodium chloride 100 mL/hr at 07/05/13 0819     Zaeden Lastinger, DO  [facility] Pager [phone]  If 7PM-7AM, please contact night-coverage [email or web address] Password [email or web address] 07/05/2013, 9:48 AM   LOS: 1 day

## 2013-07-06 LAB — BASIC METABOLIC PANEL
CO2: 23 mEq/L (ref 19–32)
Calcium: 8.7 mg/dL (ref 8.4–10.5)
Creatinine, Ser: 1.08 mg/dL (ref 0.50–1.35)
GFR calc non Af Amer: 66 mL/min — ABNORMAL LOW (ref >90)
Glucose, Bld: 235 mg/dL — ABNORMAL HIGH (ref 70–99)
Potassium: 3.6 mEq/L (ref 3.5–5.1)
Sodium: 135 mEq/L (ref 135–145)

## 2013-07-06 LAB — GLUCOSE, CAPILLARY
Glucose-Capillary: 225 mg/dL — ABNORMAL HIGH (ref 70–99)
Glucose-Capillary: 202 mg/dL — ABNORMAL HIGH (ref 70–99)
Glucose-Capillary: 260 mg/dL — ABNORMAL HIGH (ref 70–99)

## 2013-07-06 LAB — CK: Total CK: 1150 U/L — ABNORMAL HIGH (ref 7–232)

## 2013-07-06 MED ORDER — ALBUTEROL SULFATE (5 MG/ML) 0.5% IN NEBU
2.5000 mg | INHALATION_SOLUTION | RESPIRATORY_TRACT | Status: DC | PRN
  Filled 2013-07-06: qty 0.5

## 2013-07-06 MED ORDER — INSULIN ASPART 100 UNIT/ML ~~LOC~~ SOLN
0.0000 [IU] | Freq: Every day | SUBCUTANEOUS | Status: DC
  Administered 2013-07-06: 3 [IU] via SUBCUTANEOUS

## 2013-07-06 MED ORDER — PREDNISONE 50 MG PO TABS
60.0000 mg | ORAL_TABLET | Freq: Every day | ORAL | Status: DC
  Administered 2013-07-07: 10:00:00 60 mg via ORAL
  Filled 2013-07-06 (×2): qty 1

## 2013-07-06 MED ORDER — INSULIN ASPART 100 UNIT/ML ~~LOC~~ SOLN
0.0000 [IU] | Freq: Three times a day (TID) | SUBCUTANEOUS | Status: DC
  Administered 2013-07-07: 4 [IU] via SUBCUTANEOUS

## 2013-07-06 MED ORDER — ALBUTEROL SULFATE (5 MG/ML) 0.5% IN NEBU
2.5000 mg | INHALATION_SOLUTION | Freq: Four times a day (QID) | RESPIRATORY_TRACT | Status: DC
  Administered 2013-07-06 (×4): 2.5 mg via RESPIRATORY_TRACT
  Filled 2013-07-06 (×5): qty 0.5

## 2013-07-06 NOTE — Progress Notes (Signed)
Physical Therapy Treatment Patient Details Name: [name] [name] MRN: [id number] DOB: November 09, 1940 Today's Date: 07/06/2013 Time: 0829-0900 PT Time Calculation (min): 31 min  PT Assessment / Plan / Recommendation  History of Present Illness Pt feels better today, but still has sensation of "charley horse" in right anterior thigh when he walks.  He is very motivated to go home. Wife is with him today and is very supportive   PT Comments   Pt improving and likely will be OK at home with wife and max HH services as he recovers. He will needs supplemental O2 to support activity sats.  Follow Up Recommendations  Supervision/Assistance - 24 hour;Home health PT (would benefit from [facility] and possibley aide if available too)     Does the patient have the potential to tolerate intense rehabilitation     Barriers to Discharge        Equipment Recommendations  Rolling walker with 5" wheels;Wheelchair (measurements PT);Other (comment) (pt asking for a shower chair. will need w/c to enter home)    Recommendations for Other Services OT consult  Frequency Min 5X/week   Progress towards PT Goals Progress towards PT goals: Progressing toward goals  Plan Discharge plan needs to be updated;Equipment recommendations need to be updated    Precautions / Restrictions Precautions Precautions: Fall   Pertinent Vitals/Pain Pt continues with pain in anterior right thigh    Mobility  Bed Mobility Details for Bed Mobility Assistance: pt sitting up on edge of bed today Transfers Transfers: Sit to Stand;Stand to Sit Sit to Stand: 5: Supervision Stand to Sit: 5: Supervision Details for Transfer Assistance: cues to push up and reach back with arms to assist with sit<>stand Ambulation/Gait Ambulation/Gait Assistance: 4: Min assist Ambulation Distance (Feet): 75 Feet Assistive device: Rolling walker Ambulation/Gait Assistance Details: Multimodal cues to slow down, decreased step length, remain in control of  speed and body movements.  Pt becomes SOB with activity Gait Pattern: Step-through pattern;Trunk flexed Gait velocity: pt needs cues to slow down today  as he is able to walk out in the hall General Gait Details: Impairment in RLE swing though phase with ext rotation at hip and supination of foot Pt needed cues to try to get foot flat on floor to step Stairs: No (wife reports there is a ramp at the back of the house) Wheelchair Mobility Wheelchair Mobility: No    Exercises Other Exercises Other Exercises: pursed lip breathing techniques  to control SOB   PT Diagnosis:    PT Problem List:   PT Treatment Interventions:     PT Goals (current goals can now be found in the care plan section)    Visit Information  Last PT Received On: 07/06/13 Assistance Needed: +1 (+2 to pull up chair) History of Present Illness: Pt feels better today, but still has sensation of "charley horse" in right anterior thigh when he walks.  He is very motivated to go home. Wife is with him today and is very supportive    Subjective Data      Cognition  Cognition Arousal/Alertness: Awake/alert Behavior During Therapy: Impulsive;Anxious (pt better able to control his impulsiveness today) Overall Cognitive Status: Within Functional Limits for tasks assessed    Balance  Balance Balance Assessed: Yes Static Sitting Balance Static Sitting - Balance Support: No upper extremity supported;Feet supported Static Sitting - Level of Assistance: 7: Independent Static Standing Balance Static Standing - Balance Support: Bilateral upper extremity supported;During functional activity Static Standing - Level of Assistance: 3: Mod  assist;5: Stand by assistance Static Standing - Comment/# of Minutes: pt continues with limited time that he can stand, but is better able to stand than yesterday  End of Session PT - End of Session Equipment Utilized During Treatment: Gait belt;Oxygen Activity Tolerance: Patient limited by  pain Patient left: in chair;with call bell/phone within reach;with family/visitor present Nurse Communication: Mobility status   GP    Bayard Hugger. Manson Passey, [place] [phone] 07/06/2013, 9:27 AM

## 2013-07-06 NOTE — Plan of Care (Signed)
Problem: Phase I Progression Outcomes Goal: OOB as tolerated unless otherwise ordered Outcome: Progressing oob in chair and ambulate with PT.

## 2013-07-06 NOTE — Progress Notes (Signed)
Physical Therapy Treatment Patient Details Name: [name] [name] MRN: [id number] DOB: 1941-09-15 Today's Date: 07/06/2013 Time: 4098-1191 PT Time Calculation (min): 21 min  PT Assessment / Plan / Recommendation  History of Present Illness Pt continues gradual improvement   PT Comments   Working on increasing standing tolerance to prepare for increased ambulation  Follow Up Recommendations  Home health PT     Does the patient have the potential to tolerate intense rehabilitation     Barriers to Discharge        Equipment Recommendations       Recommendations for Other Services    Frequency     Progress towards PT Goals Progress towards PT goals: Progressing toward goals  Plan Current plan remains appropriate    Precautions / Restrictions Precautions Precautions: Fall   Pertinent Vitals/Pain Pain in right thigh    Mobility  Transfers Transfers: Sit to Stand;Stand to Sit Sit to Stand: 5: Supervision Stand to Sit: 5: Supervision Details for Transfer Assistance: focused this treatment on standing tolerance.  Pt able to stand for 60 , 90, 150 sec. with weight bearing on bothe legs.  He did not get as short of breath with standing Ambulation/Gait Ambulation/Gait Assistance: 4: Min assist Ambulation Distance (Feet): 3 Feet Assistive device: Rolling walker Gait Pattern: Step-through pattern;Trunk flexed General Gait Details: pt still with difficutly with RLE swing through and stance phase with increased SOB with this type of exertion Stairs: No Wheelchair Mobility Wheelchair Mobility: No    Exercises     PT Diagnosis:    PT Problem List:   PT Treatment Interventions:     PT Goals (current goals can now be found in the care plan section)    Visit Information  Last PT Received On: 07/06/13 History of Present Illness: Pt continues gradual improvement    Subjective Data      Cognition  Cognition Arousal/Alertness: Awake/alert Behavior During Therapy:  Impulsive;Anxious (pt better able to control his impulsiveness today) Overall Cognitive Status: Within Functional Limits for tasks assessed    Balance     End of Session PT - End of Session Equipment Utilized During Treatment: Gait belt;Oxygen Activity Tolerance: Patient limited by pain Patient left: in chair;with call bell/phone within reach;with family/visitor present   GP     Donnetta Hail 07/06/2013, 3:11 PM

## 2013-07-06 NOTE — Care Management Note (Signed)
Cm spoke with patient and spouse st the bedside concerning discharge planning. Pt recommendation for 24 supervision. Pt's spouse to assist with home care. Pt chooses to dc with HH services. Per pt choice [facility] to provide HHRN/PT. [facility] rep [employer] notified. Pt currently on 2l n/c. Per pt choice [employer] to provide home DME. Md order for home oxygen, RW, & shower chair. PCP: Hoyle Sauer, MD.    Roland Earl [phone]

## 2013-07-06 NOTE — Progress Notes (Signed)
[facility] PROGRESS NOTE  [name] [name] YNW:[id number] DOB: 24-May-1941 DOA: 07/04/2013 PCP: Hoyle Sauer, MD  Assessment/Plan: Rhabdomyolysis  -CPK 1433 the time of admission-->1150 -Followup aldolase  -clinically improving with strength and decreased pain -Likely multifactorial from alcohol, mechanical fall, and statin  -Continue IV fluids  -Urine drug screen--neg -Trend CPK  -Muscle biopsy if no improvement  -Patient endorses 40-50% improvement today  -ESR 15, TSH 0.567  -PT eval--HHPT -MRI right hip negative for fracture  COPD exacerbation  -Continue IV Solu-Medrol-->wean to po prednisone -Continue aerosolized albuterol  -Continue Spiriva  -Addeded long-acting beta agonist  Spinal stenosis  -MRI lumbar spine showed nondisplaced L4 pedicle fracture, L2+L3 vertebral body contusions and L2 chronic compression fraction and L2-5 spinal stenosis  -Clinically, patient has no focal deficit and has minimal back pain  -Will need outpatient neurosurgical followup--appreciate Dr. Lovell Sheehan input-->no acute neurosurgical issues at this time  -Consider lumbar corset if worsen pain  History of alcohol abuse  -Patient states that he drank again approximately 2-3 weeks ago, but has not drank since then.  Prostate cancer  -f/u Dr. Alma Friendly as outpt  -Continue finasteride  Diabetes mellitus type 2  -Hemoglobin A1c 6.1  -NovoLog sliding scale for now--I expect CBGs to improve as prednisone is weaned -Hold metformin and glipizide  -continue ASA  Hyperlipidemia  -Discontinue simvastatin do to elevated CPK  -Total cholesterol 121, LDL 56, HDL 41  CKD stage 2  -renal function stable  -baseline creatinine 1.2 after discussion with Dr. Felipa Eth  Family Communication: wife at beside  Disposition Plan: Home 07/07/13 if stable          Procedures/Studies: Dg Chest 2 View  07/04/2013   CLINICAL DATA:  Hypertension.  EXAM: CHEST  2 VIEW  COMPARISON:  None.  FINDINGS: The cardiac  silhouette is normal in size. Mediastinum is normal in contour. There are no hilar masses.  Reticular opacities are noted in the lung bases most likely scarring, subsegmental atelectasis or a combination. This is most prominent in the right lung base. Consider an infiltrate or bronchitis in the proper clinical setting.  The lungs are otherwise clear. No pleural effusion or pneumothorax.  The bony thorax is demineralized but intact.  IMPRESSION: Mild lung base opacity, greater on the right. This is most likely subsegmental atelectasis and/ or chronic scarring. Consider an infiltrate or bronchitis at the right lung base if there are symptoms consistent with this.   Electronically Signed   By: Amie Portland   On: 07/04/2013 16:29   Dg Lumbar Spine Complete  07/04/2013   CLINICAL DATA:  Fall, low back pain  EXAM: LUMBAR SPINE - COMPLETE 4+ VIEW  COMPARISON:  CT abdomen pelvis dated 01/28/2012  FINDINGS: Five lumbar type vertebral bodies.  Straightening of the lumbar spine.  Mild superior endplate compression deformity at L2, chronic.  Vertebral body heights are otherwise maintained.  Moderate multilevel degenerative changes.  Visualized bony pelvis appears intact.  Vascular calcifications.  IMPRESSION: Mild superior endplate compression deformity at L2, chronic.  Moderate multilevel degenerative changes.   Electronically Signed   By: Charline Bills M.D.   On: 07/04/2013 14:08   Dg Hip Complete Right  07/04/2013   CLINICAL DATA:  Fall, right hip pain  EXAM: RIGHT HIP - COMPLETE 2+ VIEW  COMPARISON:  None.  FINDINGS: No fracture or dislocation is seen.  Bilateral hip joints are symmetric.  Visualized bony pelvis appears intact.  Degenerative changes of the lower lumbar spine.  Vascular calcifications.  IMPRESSION:  No fracture or dislocation is seen.   Electronically Signed   By: Charline Bills M.D.   On: 07/04/2013 14:06   Mr Lumbar Spine Wo Contrast  07/04/2013   CLINICAL DATA:  72 year old male status  post fall with severe pain radiating to the right hip. Cannot walk or bear weight.  EXAM: MRI LUMBAR SPINE WITHOUT CONTRAST  TECHNIQUE: Multiplanar, multisequence MR imaging was performed. No intravenous contrast was administered.  COMPARISON:  Lumbar radiographs 07/04/2013. [facility] CT Abdomen and Pelvis 01/28/2012.  FINDINGS: Normal lumbar segmentation seen on comparisons. Chronic L2 compression fracture. There is mild marrow edema in the bilateral far lateral L2 and L3 vertebral bodies. No L3 loss of vertebral body height. See also disc level findings below.  Stable vertebral height and alignment elsewhere with chronic straightening of cervical lordosis. Trace marrow edema at the L5 superior endplate appears to be associated with a chronic Schmorl's node.  There is moderate marrow edema of the right L4 pedicle (series 6, image 6 and series 4, image 6 and series 8, image 20). No displaced pedicle fracture is evident.  Visible lower thoracic levels appear intact.  Scattered mild increased T2 and STIR signal in the erector spinae muscles bilaterally. These could be sequelae of trauma or altered biomechanics.  Visualized lower thoracic spinal cord is normal with conus medularis at T12-L1. Bb negative visualized abdominal viscera. The bladder appears mildly distended.  T11-T12: Disc bulge and posterior element hypertrophy. No definite spinal stenosis. Bilateral foraminal stenosis appears greater on the left but is not well evaluated due to motion.  T12-L1: Negative.  L1-L2: Right eccentric circumferential disc bulge mild facet hypertrophy. Mild epidural lipomatosis. Mild spinal stenosis and right L1 foraminal stenosis.  L2-L3: Severe disc space loss. Circumferential disc osteophyte complex. Epidural lipomatosis and mild to moderate facet and ligament flavum hypertrophy. Severe spinal stenosis. Severe left and moderate right L2 foraminal stenosis.  L3-L4: Right eccentric circumferential disc bulge.  Epidural lipomatosis. Mild facet and ligament flavum hypertrophy. Moderate spinal stenosis.  L4-L5: Circumferential disc bulge eccentric to the right. Moderate facet and ligament flavum hypertrophy greater on the right. Epidural lipomatosis. Moderate to severe spinal stenosis. Severe right L4 foraminal stenosis.  L5-S1: Circumferential disc osteophyte complex with small left paracentral disc protrusion. Epidural lipomatosis. Mild facet hypertrophy. Mild spinal stenosis with mild to moderate left lateral recess stenosis. Moderate to severe bilateral L5 foraminal stenosis.  IMPRESSION: 1. With regard to recent injury:  - there is focal marrow edema in the right L4 pedicle which may reflect a nondisplaced isolated pedicle fracture.  - there is mild marrow edema in mid L2 and L3 vertebral bodies suggestive of bone contusions. Chronic L2 compression fracture is stable and no new loss of lumbar vertebral body height or alignment is demonstrated.  - there is scattered increased signal in the bilateral erector spinae muscles which could reflect injury or altered biomechanics related to the above.  2. Severe multifactorial spinal stenosis at L2-L3. Moderate to severe spinal stenosis at L4-L5 with severe right foraminal stenosis. Moderate spinal stenosis at L3-L4 and mild lumbar spinal stenosis elsewhere.  3. Moderate or severe neural foraminal stenosis at the bilateral L2, right L4, and bilateral L5 nerve levels.   Electronically Signed   By: Augusto Gamble M.D.   On: 07/04/2013 19:22   Mr Hip Right Wo Contrast  07/04/2013   CLINICAL DATA:  Larey Seat. Right hip pain.  EXAM: MRI OF THE RIGHT HIP WITHOUT CONTRAST  TECHNIQUE: Multiplanar, multisequence MR imaging  was performed. No intravenous contrast was administered.  COMPARISON:  Right hip radiographs 07/04/2013.  FINDINGS: Exam limited by patient motion.  No obvious hip or pelvic fracture. The pubic symphysis and SI joints are intact. Both hips are normally located. Moderate  degenerative changes bilaterally. No joint effusion. Subchondral cystic change noted in the right acetabulum.  There are acute muscle injuries involving the left pectineus and obturator externus muscles in the right obturator internus muscle.  No significant intrapelvic abnormalities. No inguinal mass or hernia.  IMPRESSION: No acute hip or pelvic fracture.  Adductor muscle injuries bilaterally   Electronically Signed   By: Loralie Champagne M.D.   On: 07/04/2013 19:17         Subjective: Patient is breathing better. He is getting stronger and his legs. Doing well with physical therapy. Denies fevers, chills, chest pain, nausea, vomiting, diarrhea, abdominal pain. No dysuria or hematuria.  Objective: Filed Vitals:   07/06/13 0659 07/06/13 0907 07/06/13 1459 07/06/13 1631  BP: 149/79  141/68   Pulse: 79  85   Temp: 97.2 F (36.2 C)  98.4 F (36.9 C)   TempSrc: Oral  Oral   Resp: 20  20   Height:      Weight:      SpO2: 96% 96% 95% 92%    Intake/Output Summary (Last 24 hours) at 07/06/13 1915 Last data filed at 07/06/13 1300  Gross per 24 hour  Intake 1938.34 ml  Output    400 ml  Net 1538.34 ml   Weight change:  Exam:   General:  Pt is alert, follows commands appropriately, not in acute distress  HEENT: No icterus, No thrush,  Roe/AT  Cardiovascular: RRR, S1/S2, no rubs, no gallops  Respiratory: Scattered rales without wheezing. Good air movement.  Abdomen: Soft/+BS, non tender, non distended, no guarding  Extremities: trace LE edema, No lymphangitis, No petechiae, No rashes, no synovitis  Data Reviewed: Basic Metabolic Panel:  Recent Labs Lab 07/04/13 1230 07/04/13 2340 07/05/13 1105 07/06/13 0350  NA 136  --  132* 135  K 4.3  --  4.3 3.6  CL 96  --  96 99  CO2 28  --  27 23  GLUCOSE 58*  --  262* 235*  BUN 19  --  21 23  CREATININE 1.24 1.19 1.06 1.08  CALCIUM 9.5  --  8.5 8.7   Liver Function Tests:  Recent Labs Lab 07/05/13 1105  AST 38*  ALT  19  ALKPHOS 60  BILITOT 0.6  PROT 6.7  ALBUMIN 3.5   No results found for this basename: LIPASE, AMYLASE,  in the last 168 hours No results found for this basename: AMMONIA,  in the last 168 hours CBC:  Recent Labs Lab 07/04/13 1230 07/04/13 2340  WBC 8.2 8.4  NEUTROABS 6.0  --   HGB 14.8 12.9*  HCT 42.5 37.1*  MCV 90.8 90.5  PLT 220 193   Cardiac Enzymes:  Recent Labs Lab 07/04/13 2340 07/06/13 0350  CKTOTAL 1433* 1150*   BNP: No components found with this basename: POCBNP,  CBG:  Recent Labs Lab 07/05/13 1619 07/05/13 2206 07/06/13 0735 07/06/13 1157 07/06/13 1747  GLUCAP 202* 283* 225* 202* 273*    No results found for this or any previous visit (from the past 240 hour(s)).   Scheduled Meds: . albuterol  2.5 mg Nebulization QID  . aspirin  325 mg Oral Daily  . budesonide-formoterol  2 puff Inhalation BID  . finasteride  5 mg  Oral Daily  . FLUoxetine  40 mg Oral q morning - 10a  . heparin  5,000 Units Subcutaneous Q8H  . hydrochlorothiazide  25 mg Oral q morning - 10a  . insulin aspart  0-15 Units Subcutaneous TID WC  . insulin aspart  0-5 Units Subcutaneous QHS  . lisinopril  5 mg Oral q morning - 10a  . methylPREDNISolone (SOLU-MEDROL) injection  40 mg Intravenous Q8H  . pantoprazole  40 mg Oral Q1200  . tiotropium  18 mcg Inhalation Daily   Continuous Infusions: . sodium chloride 100 mL/hr at 07/06/13 1319     Tisha Cline, DO  [facility] Pager [phone]  If 7PM-7AM, please contact night-coverage [email or web address] Password [email or web address] 07/06/2013, 7:15 PM   LOS: 2 days

## 2013-07-06 NOTE — Plan of Care (Signed)
Problem: Phase I Progression Outcomes Goal: Pain controlled with appropriate interventions Outcome: Completed/Met Date Met:  07/06/13 Denies pain

## 2013-07-06 NOTE — Progress Notes (Signed)
SATURATION QUALIFICATIONS: (This note is used to comply with regulatory documentation for home oxygen)  Patient Saturations on Room Air at Rest = 90%  Patient Saturations on Room Air while Ambulating = 86%  Patient Saturations on 3 Liters of oxygen while Ambulating = 97%  Please briefly explain why patient needs home oxygen: Pt currently having leg pain and difficulty walking that requires extra effort to walk.  Pt very short of breath while walking and is assisted in ability to walk and move functionally with supplemental O2  Rosey Bath K. Manson Passey, [place] [phone]

## 2013-07-06 NOTE — Progress Notes (Signed)
Inpatient Diabetes Program Recommendations  AACE/ADA: New Consensus Statement on Inpatient Glycemic Control (2013)  Target Ranges:  Prepandial:   less than 140 mg/dL      Peak postprandial:   less than 180 mg/dL (1-2 hours)      Critically ill patients:  140 - 180 mg/dL     Results for [name] [name], [name] [name] (MRN [id number]) as of 07/06/2013 12:35  Ref. Range 07/05/2013 07:30 07/05/2013 12:00 07/05/2013 16:19 07/05/2013 22:06  Glucose-Capillary Latest Range: 70-99 mg/dL 409 (H) 811 (H) 914 (H) 283 (H)    Results for [name] [name], [name] [name] (MRN [id number]) as of 07/06/2013 12:35  Ref. Range 07/06/2013 07:35 07/06/2013 11:57  Glucose-Capillary Latest Range: 70-99 mg/dL 086 (H) 578 (H)    Patient currently getting IV Solumedrol 40 mg Q8 hours.  CBGs elevated.  Noted Novolog Moderate SSI started yesterday (1st dose was given at noon).   **MD- If patient will be on steroids for several days and patient continues to have hyperglycemia, please consider adding basal insulin while in-hospital- Recommend Levemir 15 units QHS (~0.2 units/kg dosing)   Will follow. Ambrose Finland RN, MSN, CDE Diabetes Coordinator Inpatient Diabetes Program Team Pager: [phone] (8a-10p)

## 2013-07-07 DIAGNOSIS — Z87891 Personal history of nicotine dependence: Secondary | ICD-10-CM

## 2013-07-07 LAB — BASIC METABOLIC PANEL
BUN: 22 mg/dL (ref 6–23)
CO2: 22 mEq/L (ref 19–32)
Calcium: 8.5 mg/dL (ref 8.4–10.5)
Chloride: 103 mEq/L (ref 96–112)
Creatinine, Ser: 1 mg/dL (ref 0.50–1.35)
Glucose, Bld: 224 mg/dL — ABNORMAL HIGH (ref 70–99)
Potassium: 4.1 mEq/L (ref 3.5–5.1)

## 2013-07-07 LAB — CK: Total CK: 584 U/L — ABNORMAL HIGH (ref 7–232)

## 2013-07-07 LAB — GLUCOSE, CAPILLARY: Glucose-Capillary: 172 mg/dL — ABNORMAL HIGH (ref 70–99)

## 2013-07-07 LAB — ALDOLASE: Aldolase: 7.3 U/L (ref <=8.1)

## 2013-07-07 MED ORDER — PREDNISONE 10 MG PO TABS: 50.0000 mg | ORAL_TABLET | Freq: Every day | ORAL | Status: DC

## 2013-07-07 MED ORDER — LISINOPRIL 10 MG PO TABS
10.0000 mg | ORAL_TABLET | Freq: Every morning | ORAL | Status: DC
  Administered 2013-07-07: 10 mg via ORAL
  Filled 2013-07-07: qty 1

## 2013-07-07 MED ORDER — BUDESONIDE-FORMOTEROL FUMARATE 160-4.5 MCG/ACT IN AERO: 2.0000 | INHALATION_SPRAY | Freq: Two times a day (BID) | RESPIRATORY_TRACT | Status: AC

## 2013-07-07 NOTE — Progress Notes (Signed)
OT Cancellation Note  Patient Details Name: [name] [name] MRN: [id number] DOB: 26-May-1941   Cancelled Treatment:     Cx/Reason pt not seen for acute OT: Pt w/ d/c summary in chart and reportedly being d/c today. Defer to next venue, will sign off acute OT at this time.  Roselie Awkward Dixon 07/07/2013, 8:36 AM

## 2013-07-07 NOTE — Progress Notes (Signed)
CARE MANAGEMENT NOTE 07/07/2013  Patient:  [name] [name]   Account Number:  [id number]  Date Initiated:  07/05/2013  Documentation initiated by:  DAVIS,TYMEEKA  Subjective/Objective Assessment:   72 yo male admitted with thigh pain and COPD exacerbation.PCP: Hoyle Sauer, MD     Action/Plan:   Home when stable   Anticipated DC Date:  07/07/2013   Anticipated DC Plan:  HOME W HOME HEALTH SERVICES     DC Planning Services  CM consult      Choice offered to / List presented to:  C-1 Patient   DME arranged  WALKER - ROLLING  SHOWER STOOL  OXYGEN      DME agency  [facility]    HH arranged  HH-2 PT  HH-4 NURSE'S AIDE  HH-1 RN      [facility] agency  [facility]   Status of service:  In process, will continue to follow  Medicare Important Message given?  NA - LOS <3 / Initial given by admissions  Discharge Disposition:  HOME W HOME HEALTH SERVICES  Per UR Regulation:  Reviewed for med. necessity/level of care/duration of stay  If discussed at Long Length of Stay Meetings, dates discussed:    Comments:  07/07/13 [name] (covering for Sharmon Leyden) [phone] Was called to room by pt because he was ready to leave. He has an oxygen tank and rolling walker that were delivered by Brain Montez Morita form [employer] after he received a call from [facility] case Production designer, [employer]tele[employer]. Pt wanted to know how to contact them to get the concentrator at home. I called [employer] and spoke with Ms Ward, she asked me to fax the orders. I faxed the orders for oxygen, walker and shower chair.  07/05/13 1144 Tymeeka Davis,RN,MSN [phone] Chart reviewed for utilization of services. PTA pt from home. Awaiting PT/OT to further assess dc needs. Pt currently on 2L n/c, may require home o2 at dc if unable to wean off oxygen.

## 2013-07-07 NOTE — Discharge Summary (Signed)
Physician Discharge Summary  [name] [name]:[id number] DOB: 01/10/1941 DOA: 07/04/2013  PCP: Hoyle Sauer, MD  Admit date: 07/04/2013 Discharge date: 07/07/2013  Recommendations for Outpatient Follow-up:  1. Pt will need to follow up with PCP in 2 weeks post discharge 2. Please obtain BMP to evaluate electrolytes and kidney function 3. Please also check CBC to evaluate Hg and Hct levels   Discharge Diagnoses:  Principal Problem:   Thigh pain, musculoskeletal Active Problems:   H/O prostate cancer   Diabetes mellitus   HTN (hypertension)   History of tobacco abuse   Fall   Hypoxia   HLD (hyperlipidemia)   Depression   Chronic kidney disease   Rhabdomyolysis   COPD exacerbation Rhabdomyolysis  -CPK 1433 the time of admission-->1150--->584  -Followup aldolase--pending at time of d/c  -clinically improving with strength and decreased pain  -Likely multifactorial from alcohol, mechanical fall, and statin  -Continue IV fluids  -Urine drug screen--neg  -Muscle biopsy if no improvement  -Patient endorses continued improvement on day of d/c  -ESR 15, TSH 0.567  -PT eval--HHPT  -MRI right hip negative for fracture  -Will not restart simvastatin at discharge COPD exacerbation  -Continue IV Solu-Medrol-->wean to po prednisone 60mg  daily -Continue to wean prednisone 10 mg daily (60mg -->10mg )  -Continue aerosolized albuterol while inpt -Continue Spiriva  -Addeded long-acting beta agonist  Spinal stenosis  -MRI lumbar spine showed nondisplaced L4 pedicle fracture, L2+L3 vertebral body contusions and L2 chronic compression fraction and L2-5 spinal stenosis  -Clinically, patient has no focal deficit and has minimal back pain -Neurosurg--Dr. Lovell Sheehan saw pt  -Will need outpatient neurosurgical followup--appreciate Dr. Lovell Sheehan input-->no acute neurosurgical issues at this time  -Consider lumbar corset if worsen pain  History of alcohol abuse  -Patient states that he drank again  approximately 2-3 weeks ago, but has not drank since then.  Prostate cancer  -f/u Dr. Alma Friendly as outpt  -Continue finasteride  Diabetes mellitus type 2  -Hemoglobin A1c 6.1  -NovoLog sliding scale for now--I expect CBGs to improve as prednisone is weaned  -Hold metformin and glipizide--restart after discharge  -continue ASA  Hyperlipidemia  -Discontinue simvastatin do to elevated CPK  -Total cholesterol 121, LDL 56, HDL 41  CKD stage 2  -renal function stable/improved -serum creatinine on day of d/c 1.00 -baseline creatinine 1.2 after discussion with Dr. Felipa Eth  Hypertension -Restart home regimen at the time of discharge Family Communication: wife at beside    Discharge Condition: stable  Disposition: home  Diet:carb modified Wt Readings from Last 3 Encounters:  07/04/13 98.9 kg (218 lb 0.6 oz)    History of present illness:  72 y.o. male with past medical history significant for hypertension, hyperlipidemia, diabetes and history of prostate cancer/BPH; came to the hospital complaining of bilateral lower extremity pain. Patient reports that he fail about week and a half ago at home. He was a mechanical fall. Since that time he started having pain affecting both hips and thighs. Patient stated he began having some lower extremities weakness even prior to his fall. Patient reports that despite the use of NSAID's, the pain continues to bother him to the point that he is unable to walk or to bear any weight at this moment.  Patient denies any chest pain, nausea, vomiting, abdominal pain, dysuria, no worsening shortness of breath (reports that time to time he needs to stop on exertion in order to catch his breath and that he may have some nonproductive cough here and there) or any  other acute complaints.  in the ED patient was found to have bilateral muscle inflammation affecting both thighs, hypoxia and decreased breath sounds bilaterally. The patient was started on intravenous steroids  and aerosolized nebulizers. IV fluids were started. CPK was noted to be 1433.    Consultants: Neurosurgery  Discharge Exam: Filed Vitals:   07/07/13 0505  BP: 146/66  Pulse: 74  Temp: 98.4 F (36.9 C)  Resp: 20   Filed Vitals:   07/06/13 1631 07/06/13 2040 07/06/13 2255 07/07/13 0505  BP:   149/55 146/66  Pulse:  78 80 74  Temp:   98.6 F (37 C) 98.4 F (36.9 C)  TempSrc:   Oral Oral  Resp:   22 20  Height:      Weight:      SpO2: 92%  92% 94%   General: A&O x 3, NAD, pleasant, cooperative Cardiovascular: RRR, no rub, no gallop, no S3 Respiratory: Bibasilar rales without any wheezing. Good air movement. Abdomen:soft, nontender, nondistended, positive bowel sounds Extremities: trace LE edema, No lymphangitis, no petechiae  Discharge Instructions     Medication List    STOP taking these medications       simvastatin 40 MG tablet  Commonly known as:  ZOCOR      TAKE these medications       albuterol (2.5 MG/3ML) 0.083% nebulizer solution  Commonly known as:  PROVENTIL  Take 2.5 mg by nebulization every 6 (six) hours as needed for wheezing or shortness of breath.     aspirin 325 MG tablet  Take 325 mg by mouth daily.     budesonide-formoterol 160-4.5 MCG/ACT inhaler  Commonly known as:  SYMBICORT  Inhale 2 puffs into the lungs 2 (two) times daily.     cholecalciferol 1000 UNITS tablet  Commonly known as:  VITAMIN D  Take 1,000 Units by mouth daily.     finasteride 5 MG tablet  Commonly known as:  PROSCAR  Take 5 mg by mouth daily.     FLUoxetine 40 MG capsule  Commonly known as:  PROZAC  Take 40 mg by mouth every morning.     glipiZIDE 10 MG tablet  Commonly known as:  GLUCOTROL  Take 10 mg by mouth 2 (two) times daily before a meal.     hydrochlorothiazide 25 MG tablet  Commonly known as:  HYDRODIURIL  Take 25 mg by mouth every morning.     hydroxypropyl methylcellulose 2.5 % ophthalmic solution  Commonly known as:  ISOPTO TEARS  Place 1  drop into both eyes as needed (for dryness).     lisinopril 20 MG tablet  Commonly known as:  PRINIVIL,ZESTRIL  Take 10 mg by mouth every morning.     metFORMIN 1000 MG tablet  Commonly known as:  GLUCOPHAGE  Take 1,000 mg by mouth 2 (two) times daily with a meal.     predniSONE 10 MG tablet  Commonly known as:  DELTASONE  Take 5 tablets (50 mg total) by mouth daily with breakfast. Start 07/08/13.  Decrease by one tablet daily until gone     tiotropium 18 MCG inhalation capsule  Commonly known as:  SPIRIVA  Place 18 mcg into inhaler and inhale daily.         The results of significant diagnostics from this hospitalization (including imaging, microbiology, ancillary and laboratory) are listed below for reference.    Significant Diagnostic Studies: Dg Chest 2 View  07/04/2013   CLINICAL DATA:  Hypertension.  EXAM: CHEST  2  VIEW  COMPARISON:  None.  FINDINGS: The cardiac silhouette is normal in size. Mediastinum is normal in contour. There are no hilar masses.  Reticular opacities are noted in the lung bases most likely scarring, subsegmental atelectasis or a combination. This is most prominent in the right lung base. Consider an infiltrate or bronchitis in the proper clinical setting.  The lungs are otherwise clear. No pleural effusion or pneumothorax.  The bony thorax is demineralized but intact.  IMPRESSION: Mild lung base opacity, greater on the right. This is most likely subsegmental atelectasis and/ or chronic scarring. Consider an infiltrate or bronchitis at the right lung base if there are symptoms consistent with this.   Electronically Signed   By: Amie Portland   On: 07/04/2013 16:29   Dg Lumbar Spine Complete  07/04/2013   CLINICAL DATA:  Fall, low back pain  EXAM: LUMBAR SPINE - COMPLETE 4+ VIEW  COMPARISON:  CT abdomen pelvis dated 01/28/2012  FINDINGS: Five lumbar type vertebral bodies.  Straightening of the lumbar spine.  Mild superior endplate compression deformity at L2,  chronic.  Vertebral body heights are otherwise maintained.  Moderate multilevel degenerative changes.  Visualized bony pelvis appears intact.  Vascular calcifications.  IMPRESSION: Mild superior endplate compression deformity at L2, chronic.  Moderate multilevel degenerative changes.   Electronically Signed   By: Charline Bills M.D.   On: 07/04/2013 14:08   Dg Hip Complete Right  07/04/2013   CLINICAL DATA:  Fall, right hip pain  EXAM: RIGHT HIP - COMPLETE 2+ VIEW  COMPARISON:  None.  FINDINGS: No fracture or dislocation is seen.  Bilateral hip joints are symmetric.  Visualized bony pelvis appears intact.  Degenerative changes of the lower lumbar spine.  Vascular calcifications.  IMPRESSION: No fracture or dislocation is seen.   Electronically Signed   By: Charline Bills M.D.   On: 07/04/2013 14:06   Mr Lumbar Spine Wo Contrast  07/04/2013   CLINICAL DATA:  72 year old male status post fall with severe pain radiating to the right hip. Cannot walk or bear weight.  EXAM: MRI LUMBAR SPINE WITHOUT CONTRAST  TECHNIQUE: Multiplanar, multisequence MR imaging was performed. No intravenous contrast was administered.  COMPARISON:  Lumbar radiographs 07/04/2013. [facility] CT Abdomen and Pelvis 01/28/2012.  FINDINGS: Normal lumbar segmentation seen on comparisons. Chronic L2 compression fracture. There is mild marrow edema in the bilateral far lateral L2 and L3 vertebral bodies. No L3 loss of vertebral body height. See also disc level findings below.  Stable vertebral height and alignment elsewhere with chronic straightening of cervical lordosis. Trace marrow edema at the L5 superior endplate appears to be associated with a chronic Schmorl's node.  There is moderate marrow edema of the right L4 pedicle (series 6, image 6 and series 4, image 6 and series 8, image 20). No displaced pedicle fracture is evident.  Visible lower thoracic levels appear intact.  Scattered mild increased T2 and STIR signal  in the erector spinae muscles bilaterally. These could be sequelae of trauma or altered biomechanics.  Visualized lower thoracic spinal cord is normal with conus medularis at T12-L1. Bb negative visualized abdominal viscera. The bladder appears mildly distended.  T11-T12: Disc bulge and posterior element hypertrophy. No definite spinal stenosis. Bilateral foraminal stenosis appears greater on the left but is not well evaluated due to motion.  T12-L1: Negative.  L1-L2: Right eccentric circumferential disc bulge mild facet hypertrophy. Mild epidural lipomatosis. Mild spinal stenosis and right L1 foraminal stenosis.  L2-L3: Severe disc  space loss. Circumferential disc osteophyte complex. Epidural lipomatosis and mild to moderate facet and ligament flavum hypertrophy. Severe spinal stenosis. Severe left and moderate right L2 foraminal stenosis.  L3-L4: Right eccentric circumferential disc bulge. Epidural lipomatosis. Mild facet and ligament flavum hypertrophy. Moderate spinal stenosis.  L4-L5: Circumferential disc bulge eccentric to the right. Moderate facet and ligament flavum hypertrophy greater on the right. Epidural lipomatosis. Moderate to severe spinal stenosis. Severe right L4 foraminal stenosis.  L5-S1: Circumferential disc osteophyte complex with small left paracentral disc protrusion. Epidural lipomatosis. Mild facet hypertrophy. Mild spinal stenosis with mild to moderate left lateral recess stenosis. Moderate to severe bilateral L5 foraminal stenosis.  IMPRESSION: 1. With regard to recent injury:  - there is focal marrow edema in the right L4 pedicle which may reflect a nondisplaced isolated pedicle fracture.  - there is mild marrow edema in mid L2 and L3 vertebral bodies suggestive of bone contusions. Chronic L2 compression fracture is stable and no new loss of lumbar vertebral body height or alignment is demonstrated.  - there is scattered increased signal in the bilateral erector spinae muscles which could  reflect injury or altered biomechanics related to the above.  2. Severe multifactorial spinal stenosis at L2-L3. Moderate to severe spinal stenosis at L4-L5 with severe right foraminal stenosis. Moderate spinal stenosis at L3-L4 and mild lumbar spinal stenosis elsewhere.  3. Moderate or severe neural foraminal stenosis at the bilateral L2, right L4, and bilateral L5 nerve levels.   Electronically Signed   By: Augusto Gamble M.D.   On: 07/04/2013 19:22   Mr Hip Right Wo Contrast  07/04/2013   CLINICAL DATA:  Larey Seat. Right hip pain.  EXAM: MRI OF THE RIGHT HIP WITHOUT CONTRAST  TECHNIQUE: Multiplanar, multisequence MR imaging was performed. No intravenous contrast was administered.  COMPARISON:  Right hip radiographs 07/04/2013.  FINDINGS: Exam limited by patient motion.  No obvious hip or pelvic fracture. The pubic symphysis and SI joints are intact. Both hips are normally located. Moderate degenerative changes bilaterally. No joint effusion. Subchondral cystic change noted in the right acetabulum.  There are acute muscle injuries involving the left pectineus and obturator externus muscles in the right obturator internus muscle.  No significant intrapelvic abnormalities. No inguinal mass or hernia.  IMPRESSION: No acute hip or pelvic fracture.  Adductor muscle injuries bilaterally   Electronically Signed   By: Loralie Champagne M.D.   On: 07/04/2013 19:17     Microbiology: No results found for this or any previous visit (from the past 240 hour(s)).   Labs: Basic Metabolic Panel:  Recent Labs Lab 07/04/13 1230 07/04/13 2340 07/05/13 1105 07/06/13 0350 07/07/13 0450  NA 136  --  132* 135 136  K 4.3  --  4.3 3.6 4.1  CL 96  --  96 99 103  CO2 28  --  27 23 22   GLUCOSE 58*  --  262* 235* 224*  BUN 19  --  21 23 22   CREATININE 1.24 1.19 1.06 1.08 1.00  CALCIUM 9.5  --  8.5 8.7 8.5   Liver Function Tests:  Recent Labs Lab 07/05/13 1105  AST 38*  ALT 19  ALKPHOS 60  BILITOT 0.6  PROT 6.7   ALBUMIN 3.5   No results found for this basename: LIPASE, AMYLASE,  in the last 168 hours No results found for this basename: AMMONIA,  in the last 168 hours CBC:  Recent Labs Lab 07/04/13 1230 07/04/13 2340  WBC 8.2 8.4  NEUTROABS 6.0  --  HGB 14.8 12.9*  HCT 42.5 37.1*  MCV 90.8 90.5  PLT 220 193   Cardiac Enzymes:  Recent Labs Lab 07/04/13 2340 07/06/13 0350 07/07/13 0450  CKTOTAL 1433* 1150* 584*   BNP: No components found with this basename: POCBNP,  CBG:  Recent Labs Lab 07/05/13 2206 07/06/13 0735 07/06/13 1157 07/06/13 1747 07/06/13 2150  GLUCAP 283* 225* 202* 273* 260*    Time coordinating discharge:  Greater than 30 minutes  Signed:  Shantea Poulton, DO [facility] Pager: [phone] 07/07/2013, 8:15 AM

## 2013-07-07 NOTE — Progress Notes (Signed)
Physical Therapy Treatment Patient Details Name: [name] [name] MRN: [id number] DOB: [birth year] Today's Date: 07/07/2013 Time: 2130-8657 PT Time Calculation (min): 28 min  PT Assessment / Plan / Recommendation  History of Present Illness pt anxious to go home   PT Comments   Pt continues to have severe SOB with ambulation and anxiety even with supplemental O2. He continues to be limited in RLE movement and gait.  Pt is very anxious to go home and his wife is a calming support to him. Anxiety is large contributor to his SOB.  Wife understands how to progress his activity , but max  home health support is needed as well as well  Follow Up Recommendations  Home health PT     Does the patient have the potential to tolerate intense rehabilitation     Barriers to Discharge        Equipment Recommendations       Recommendations for Other Services    Frequency     Progress towards PT Goals Progress towards PT goals: Progressing toward goals  Plan Current plan remains appropriate    Precautions / Restrictions     Pertinent Vitals/Pain Pt reports both his thighs are 'sore'    Mobility  Bed Mobility Details for Bed Mobility Assistance: pt sitting up on edge of bed today Transfers Transfers: Sit to Stand;Stand to Sit Sit to Stand: 6: Modified independent (Device/Increase time) Stand to Sit: 6: Modified independent (Device/Increase time) Details for Transfer Assistance: pt needs cues to push up with hands. He moves impulsively Ambulation/Gait Ambulation/Gait Assistance: 4: Min assist Assistive device: Rolling walker Ambulation/Gait Assistance Details: Pt better able to contol impulsiveness today , but continues to get SOB with activity Gait Pattern: Step-to pattern Gait velocity: better control of speed General Gait Details: continues with RLE movement impairment with supination of foot  Stairs: No Wheelchair Mobility Wheelchair Mobility: No    Exercises Other Exercises Other  Exercises: reinforced small bouts of exercise with pt at home starting with increasing standing tolerance with movement and breathing in control   PT Diagnosis:    PT Problem List:   PT Treatment Interventions:     PT Goals (current goals can now be found in the care plan section) Acute Rehab PT Goals Patient Stated Goal: to go home today  Visit Information  Last PT Received On: 07/07/13 History of Present Illness: pt anxious to go home    Subjective Data  Patient Stated Goal: to go home today   Cognition  Cognition Arousal/Alertness: Awake/alert Behavior During Therapy: Impulsive;Anxious (pt better able to control his impulsiveness today) Overall Cognitive Status: Within Functional Limits for tasks assessed    Balance     End of Session PT - End of Session Equipment Utilized During Treatment: Gait belt;Oxygen Activity Tolerance: Patient limited by pain Patient left: in chair;with call bell/phone within reach;with family/visitor present   GP    Rosey Bath K. Manson Passey, [place] [phone] 07/07/2013, 9:41 AM

## 2013-08-11 ENCOUNTER — Other Ambulatory Visit: Payer: Self-pay | Admitting: Urology

## 2013-08-11 DIAGNOSIS — C61 Malignant neoplasm of prostate: Secondary | ICD-10-CM

## 2013-08-29 ENCOUNTER — Encounter (HOSPITAL_COMMUNITY): Payer: Self-pay

## 2013-08-29 ENCOUNTER — Encounter (HOSPITAL_COMMUNITY)
Admission: RE | Admit: 2013-08-29 | Discharge: 2013-08-29 | Disposition: A | Discharge status: Home / Self Care | Payer: Medicare PPO | Source: Ambulatory Visit | Attending: Urology | Admitting: Urology

## 2013-08-29 DIAGNOSIS — C61 Malignant neoplasm of prostate: Secondary | ICD-10-CM | POA: Insufficient documentation

## 2013-08-29 MED ORDER — TECHNETIUM TC 99M MEDRONATE IV KIT
26.0000 | PACK | Freq: Once | INTRAVENOUS | Status: AC | PRN
  Administered 2013-08-29: 26 via INTRAVENOUS

## 2013-10-03 ENCOUNTER — Other Ambulatory Visit: Payer: Self-pay | Admitting: Orthopaedic Surgery

## 2013-10-03 DIAGNOSIS — M549 Dorsalgia, unspecified: Secondary | ICD-10-CM

## 2013-10-17 ENCOUNTER — Encounter (HOSPITAL_BASED_OUTPATIENT_CLINIC_OR_DEPARTMENT_OTHER): Payer: Medicare PPO | Attending: General Surgery

## 2013-10-17 ENCOUNTER — Ambulatory Visit
Admission: RE | Admit: 2013-10-17 | Discharge: 2013-10-17 | Disposition: A | Discharge status: Home / Self Care | Payer: PRIVATE HEALTH INSURANCE | Source: Ambulatory Visit | Attending: Orthopaedic Surgery | Admitting: Orthopaedic Surgery

## 2013-10-17 ENCOUNTER — Ambulatory Visit
Admission: RE | Admit: 2013-10-17 | Discharge: 2013-10-17 | Disposition: A | Discharge status: Home / Self Care | Payer: PRIVATE HEALTH INSURANCE | Source: Ambulatory Visit | Attending: General Surgery | Admitting: General Surgery

## 2013-10-17 ENCOUNTER — Other Ambulatory Visit (HOSPITAL_BASED_OUTPATIENT_CLINIC_OR_DEPARTMENT_OTHER): Payer: Self-pay | Admitting: General Surgery

## 2013-10-17 DIAGNOSIS — E1169 Type 2 diabetes mellitus with other specified complication: Secondary | ICD-10-CM | POA: Insufficient documentation

## 2013-10-17 DIAGNOSIS — J4489 Other specified chronic obstructive pulmonary disease: Secondary | ICD-10-CM | POA: Insufficient documentation

## 2013-10-17 DIAGNOSIS — I739 Peripheral vascular disease, unspecified: Secondary | ICD-10-CM

## 2013-10-17 DIAGNOSIS — Z923 Personal history of irradiation: Secondary | ICD-10-CM | POA: Insufficient documentation

## 2013-10-17 DIAGNOSIS — M216X9 Other acquired deformities of unspecified foot: Secondary | ICD-10-CM | POA: Insufficient documentation

## 2013-10-17 DIAGNOSIS — G5790 Unspecified mononeuropathy of unspecified lower limb: Secondary | ICD-10-CM | POA: Insufficient documentation

## 2013-10-17 DIAGNOSIS — I10 Essential (primary) hypertension: Secondary | ICD-10-CM | POA: Insufficient documentation

## 2013-10-17 DIAGNOSIS — E119 Type 2 diabetes mellitus without complications: Secondary | ICD-10-CM | POA: Insufficient documentation

## 2013-10-17 DIAGNOSIS — M549 Dorsalgia, unspecified: Secondary | ICD-10-CM

## 2013-10-17 DIAGNOSIS — M810 Age-related osteoporosis without current pathological fracture: Secondary | ICD-10-CM | POA: Insufficient documentation

## 2013-10-17 DIAGNOSIS — Z8546 Personal history of malignant neoplasm of prostate: Secondary | ICD-10-CM | POA: Insufficient documentation

## 2013-10-17 DIAGNOSIS — L97509 Non-pressure chronic ulcer of other part of unspecified foot with unspecified severity: Secondary | ICD-10-CM | POA: Insufficient documentation

## 2013-10-17 DIAGNOSIS — M7989 Other specified soft tissue disorders: Secondary | ICD-10-CM | POA: Insufficient documentation

## 2013-10-17 DIAGNOSIS — J449 Chronic obstructive pulmonary disease, unspecified: Secondary | ICD-10-CM | POA: Insufficient documentation

## 2013-10-17 LAB — GLUCOSE, CAPILLARY: Glucose-Capillary: 55 mg/dL — ABNORMAL LOW (ref 70–99)

## 2013-10-18 ENCOUNTER — Other Ambulatory Visit (HOSPITAL_COMMUNITY): Payer: Self-pay | Admitting: General Surgery

## 2013-10-18 DIAGNOSIS — I739 Peripheral vascular disease, unspecified: Secondary | ICD-10-CM

## 2013-10-18 NOTE — H&P (Signed)
[name] [name], [name] [name]                ACCOUNT NO.:  [id number]  MEDICAL RECORD NO.:  [id number]  LOCATION:  FOOT                         FACILITY:  [facility]  PHYSICIAN:  Elesa Hacker, M.D.        DATE OF BIRTH:  09-07-1941  DATE OF ADMISSION:  10/17/2013 DATE OF DISCHARGE:                             HISTORY & PHYSICAL   CHIEF COMPLAINT:  Sore right fourth toe.  HISTORY OF PRESENT ILLNESS:  This is a 73 year old diabetic male.  He has a history of cancer of the prostate treated with radiation, and a history of bone lesions on the back in addition to spinal stenosis.  He has a severe footdrop of the left foot with the swelling of the left foot.  This is thought possibly to be due to a back injury or tumor.  He is going to have an MRI of his back.  In addition, on the right fourth toe, he has developed an ulceration approximately one month duration. His toes are severely cocked up and he basically walks on them.  He claims that this has been through his whole life.  He has a fairly profound neuropathy of the right forefoot.  SOCIAL HISTORY:  Cigarettes, none for 8 years.  Alcohol, occasionally.  PAST MEDICAL HISTORY:  Diabetes, hypertension, depression, rhabdomyolysis, COPD, increased lipids, DJD, spinal stenosis, lumbar compression fracture, osteoporosis, cancer of the prostate with possible metastases of the back.  PAST SURGICAL HISTORY:  Cataract and rotator cuff surgery.  REVIEW OF SYSTEMS:  As above.  PHYSICAL EXAMINATION:  VITAL SIGNS:  Temperature 97.5, pulse 90, respirations 16, blood pressure 160/82.  Glucose is 55. GENERAL APPEARANCE:  Well-developed, well-nourished, no distress. CHEST:  Clear. HEART:  Regular rhythm. EXTREMITIES:  Reveals a ABI of 0.83 on the right.  Peripheral pulses are not palpable.  On the tip of the fourth toe, which is severely cocked up, there is a 0.5 x 0.5 x 0.5 wound, which easily probes to the tip of the phalanx, although the phalanx does not  appear to be exposed.  IMPRESSION:  Diabetic foot ulcer, probably Wagner 2.  PLAN:  We will get arterial studies and x-ray of the fourth toe, rule out osteo.  We will pack the wound which is fairly clean with silver alginate.  Try to offload with __________.  We will see him in 7 days.     Elesa Hacker, M.D.     RA/MEDQ  D:  10/17/2013  T:  10/17/2013  Job:  [id number]

## 2013-10-19 ENCOUNTER — Ambulatory Visit (HOSPITAL_COMMUNITY)
Admission: RE | Admit: 2013-10-19 | Discharge: 2013-10-19 | Disposition: A | Discharge status: Home / Self Care | Payer: Medicare PPO | Source: Ambulatory Visit | Attending: Internal Medicine | Admitting: Internal Medicine

## 2013-10-19 DIAGNOSIS — I739 Peripheral vascular disease, unspecified: Secondary | ICD-10-CM | POA: Insufficient documentation

## 2013-10-19 NOTE — Progress Notes (Signed)
Lower Extremity Arterial Duplex Completed. Evidence for 50-69% stenosis in bilateral superficial femoral arteries. [place]

## 2013-10-24 LAB — GLUCOSE, CAPILLARY: Glucose-Capillary: 78 mg/dL (ref 70–99)

## 2013-10-31 LAB — GLUCOSE, CAPILLARY: GLUCOSE-CAPILLARY: 103 mg/dL — AB (ref 70–99)

## 2013-11-06 ENCOUNTER — Other Ambulatory Visit: Payer: Self-pay | Admitting: Orthopaedic Surgery

## 2013-11-07 ENCOUNTER — Encounter (HOSPITAL_BASED_OUTPATIENT_CLINIC_OR_DEPARTMENT_OTHER): Payer: Medicare PPO | Attending: General Surgery

## 2013-11-07 ENCOUNTER — Other Ambulatory Visit: Payer: Self-pay | Admitting: Orthopaedic Surgery

## 2013-11-07 DIAGNOSIS — E1169 Type 2 diabetes mellitus with other specified complication: Secondary | ICD-10-CM | POA: Insufficient documentation

## 2013-11-07 DIAGNOSIS — M542 Cervicalgia: Secondary | ICD-10-CM

## 2013-11-07 DIAGNOSIS — L97509 Non-pressure chronic ulcer of other part of unspecified foot with unspecified severity: Secondary | ICD-10-CM | POA: Insufficient documentation

## 2013-11-16 ENCOUNTER — Ambulatory Visit
Admission: RE | Admit: 2013-11-16 | Discharge: 2013-11-16 | Disposition: A | Discharge status: Home / Self Care | Payer: PRIVATE HEALTH INSURANCE | Source: Ambulatory Visit | Attending: Orthopaedic Surgery | Admitting: Orthopaedic Surgery

## 2013-11-16 DIAGNOSIS — M542 Cervicalgia: Secondary | ICD-10-CM

## 2013-11-28 LAB — GLUCOSE, CAPILLARY: GLUCOSE-CAPILLARY: 149 mg/dL — AB (ref 70–99)

## 2013-12-05 ENCOUNTER — Encounter (HOSPITAL_BASED_OUTPATIENT_CLINIC_OR_DEPARTMENT_OTHER): Payer: Medicare PPO | Attending: General Surgery

## 2013-12-05 DIAGNOSIS — E1169 Type 2 diabetes mellitus with other specified complication: Secondary | ICD-10-CM | POA: Insufficient documentation

## 2013-12-05 DIAGNOSIS — L97509 Non-pressure chronic ulcer of other part of unspecified foot with unspecified severity: Secondary | ICD-10-CM | POA: Insufficient documentation

## 2014-01-30 ENCOUNTER — Encounter: Payer: Self-pay | Admitting: Podiatrist

## 2014-01-30 ENCOUNTER — Ambulatory Visit (INDEPENDENT_AMBULATORY_CARE_PROVIDER_SITE_OTHER): Payer: Commercial Managed Care - HMO | Admitting: Podiatrist

## 2014-01-30 VITALS — BP 146/79 | HR 81 | Resp 18

## 2014-01-30 DIAGNOSIS — M24576 Contracture, unspecified foot: Principal | ICD-10-CM

## 2014-01-30 DIAGNOSIS — M2040 Other hammer toe(s) (acquired), unspecified foot: Secondary | ICD-10-CM

## 2014-01-30 DIAGNOSIS — B351 Tinea unguium: Secondary | ICD-10-CM

## 2014-01-30 DIAGNOSIS — L97509 Non-pressure chronic ulcer of other part of unspecified foot with unspecified severity: Secondary | ICD-10-CM

## 2014-01-30 DIAGNOSIS — M79609 Pain in unspecified limb: Secondary | ICD-10-CM

## 2014-01-30 DIAGNOSIS — M24573 Contracture, unspecified ankle: Secondary | ICD-10-CM

## 2014-01-30 NOTE — Progress Notes (Signed)
   Subjective:    Patient ID: [name] [name], male    DOB: [birth year], 73 y.o.   MRN: [id number]  HPI Patient presents today for a diabetic foot check and for ulcerations on the left 3rd toe and right 4th toe.  He has been seen at the wound healing center for the right 4th toe and was discharged as it had healed well.  He has a small ulcer on the tip of the left 3rd toe as well.  His doctor is Dr. Dagmar Hait who referred him to podiatry to evaluate his feet and toes.  He states he is getting ready to get some botox in his feet Feb 27, 2014 dut to a contracture deformity on the left that makes it very difficult for him to ambluate.  He also states he is a border- line diabetic.      Review of Systems  Musculoskeletal: Positive for gait problem.  All other systems reviewed and are negative.      Objective:   Physical Exam GENERAL APPEARANCE: Alert, conversant. Appropriately groomed. No acute distress. Contracture of the left foot at the ankle is noted.   VASCULAR: Pedal pulses palpable at 2/4 DP and PT bilateral.  Capillary refill time is immediate to all digits,  Proximal to distal cooling it warm to warm.  Digital hair growth is present bilateral  NEUROLOGIC: sensation is intact epicritically and protectively to 5.07 monofilament at 5/5 sites bilateral.  Light touch is intact bilateral, vibratory sensation intact bilateral, achilles tendon reflex is intact bilateral.  MUSCULOSKELETAL: Flexion contracture is present at the ankle joint and digits on the left foot. Right foot has a mild flexion contracture at the distal digits as well. High arched foot type bilaterally is seen.  DERMATOLOGIC: Small, healing ulceration is present distal tip of the right fourth toe from pressure from the flexor contracture. He also has a very pinpoint ulceration at the distal tip of the left third toe from the contracture as well. The ulcerations appear noninfected and look like they're healing uneventfully. All toenails are  elongated, thickened, dystrophic and clinically mycotic.    Assessment & Plan:  Hammertoe deformity with digital deformity of toe. Ulceration x2, mycotic toenails x10  Plan: Did a very light debridement of the distal tip of the right fourth toe and applied antibiotic ointment and a dressing. Instructed the patient to continue applying antibiotic ointment and a dressing for 2 more weeks in the ulceration should be healed completely at that time. I also debrided the toenails today without complication. He will be seen back for routine care and if any problems concerns or questions arise he is instructed to contact me immediately.

## 2014-01-30 NOTE — Patient Instructions (Signed)
Keep an eye on the tips of your right 4th toe and left 3rd toe.  There is a tiny opening at the tip of the right 4th toe so keep putting on the ointment and cover with a bandaid for another week.  If you see any redness, swelling or notice drainge or pain, please let me know

## 2014-03-28 DIAGNOSIS — M24573 Contracture, unspecified ankle: Secondary | ICD-10-CM | POA: Insufficient documentation

## 2014-04-03 ENCOUNTER — Ambulatory Visit: Payer: Commercial Managed Care - HMO | Admitting: Podiatrist

## 2014-04-17 ENCOUNTER — Encounter: Payer: Self-pay | Admitting: Podiatrist

## 2014-04-17 ENCOUNTER — Ambulatory Visit (INDEPENDENT_AMBULATORY_CARE_PROVIDER_SITE_OTHER): Payer: Commercial Managed Care - HMO | Admitting: Podiatrist

## 2014-04-17 VITALS — BP 172/97 | HR 95 | Resp 18

## 2014-04-17 DIAGNOSIS — B351 Tinea unguium: Secondary | ICD-10-CM

## 2014-04-17 DIAGNOSIS — M79609 Pain in unspecified limb: Secondary | ICD-10-CM

## 2014-04-17 DIAGNOSIS — M79673 Pain in unspecified foot: Secondary | ICD-10-CM

## 2014-04-17 NOTE — Patient Instructions (Signed)
AENS--- association of extremity nerve surgeons.  A small group of specialized surgeons with training the help with nerve issues of the legs and feet.  I think the nearest one is in [place]?  It might be a good avenue to take if you are reaching some dead ends.

## 2014-04-17 NOTE — Progress Notes (Signed)
I AM HERE TO GET HAVE MY TOENAILS TRIMMED UP  HPI: Patient presents today for follow up of diabetic foot and nail care. Past medical history, meds, and allergies reviewed.   Objective:    GENERAL APPEARANCE: Alert, conversant. Appropriately groomed. No acute distress. Contracture of the left foot at the ankle is noted.  VASCULAR: Pedal pulses palpable at 2/4 DP and PT bilateral. Capillary refill time is immediate to all digits, Proximal to distal cooling it warm to warm. Digital hair growth is present bilateral  NEUROLOGIC: sensation is intact epicritically and protectively to 5.07 monofilament at 3/5 sites bilateral. Light touch is intact bilateral, vibratory sensation intact bilateral, achilles tendon reflex is intact bilateral.  MUSCULOSKELETAL: Flexion contracture is present at the ankle joint and digits on the left foot. Right foot has a mild flexion contracture at the distal digits as well. High arched foot type bilaterally is seen.  DERMATOLOGIC: Small, healing ulceration is present distal tip of the right fourth toe from pressure from the flexor contracture.  All toenails are elongated, thickened, dystrophic and clinically mycotic.   Assessment: Diabetes with Neuropathy, symptomatic mycotic toenails  Plan: Discussed treatment options and alternatives. Debrided nails without complication. Debrided ulceration right third toe. Discussed that he needs to watch this very carefully and continue applying wound care to the toe.  Return appointment recommended at routine intervals of 3 months.   Trudie Buckler, DPM

## 2014-04-19 DIAGNOSIS — M216X9 Other acquired deformities of unspecified foot: Secondary | ICD-10-CM | POA: Insufficient documentation

## 2014-05-02 ENCOUNTER — Ambulatory Visit: Payer: Medicare PPO | Attending: Orthopedic Surgery

## 2014-05-02 DIAGNOSIS — M216X9 Other acquired deformities of unspecified foot: Secondary | ICD-10-CM | POA: Diagnosis not present

## 2014-05-02 DIAGNOSIS — M25669 Stiffness of unspecified knee, not elsewhere classified: Secondary | ICD-10-CM | POA: Insufficient documentation

## 2014-05-02 DIAGNOSIS — IMO0001 Reserved for inherently not codable concepts without codable children: Secondary | ICD-10-CM | POA: Diagnosis present

## 2014-05-04 ENCOUNTER — Ambulatory Visit: Payer: Medicare PPO | Admitting: Physical Therapy

## 2014-05-04 DIAGNOSIS — IMO0001 Reserved for inherently not codable concepts without codable children: Secondary | ICD-10-CM | POA: Diagnosis not present

## 2014-05-07 ENCOUNTER — Other Ambulatory Visit: Payer: Self-pay | Admitting: Urology

## 2014-05-07 DIAGNOSIS — C61 Malignant neoplasm of prostate: Secondary | ICD-10-CM

## 2014-05-07 DIAGNOSIS — C7951 Secondary malignant neoplasm of bone: Secondary | ICD-10-CM

## 2014-05-11 ENCOUNTER — Ambulatory Visit: Payer: Medicare PPO | Attending: Orthopedic Surgery | Admitting: Physical Therapy

## 2014-05-11 DIAGNOSIS — M25669 Stiffness of unspecified knee, not elsewhere classified: Secondary | ICD-10-CM | POA: Insufficient documentation

## 2014-05-11 DIAGNOSIS — M216X9 Other acquired deformities of unspecified foot: Secondary | ICD-10-CM | POA: Insufficient documentation

## 2014-05-11 DIAGNOSIS — IMO0001 Reserved for inherently not codable concepts without codable children: Secondary | ICD-10-CM | POA: Insufficient documentation

## 2014-05-14 ENCOUNTER — Ambulatory Visit: Payer: Medicare PPO

## 2014-05-14 DIAGNOSIS — M216X9 Other acquired deformities of unspecified foot: Secondary | ICD-10-CM | POA: Diagnosis not present

## 2014-05-14 DIAGNOSIS — IMO0001 Reserved for inherently not codable concepts without codable children: Secondary | ICD-10-CM | POA: Diagnosis not present

## 2014-05-14 DIAGNOSIS — M25669 Stiffness of unspecified knee, not elsewhere classified: Secondary | ICD-10-CM | POA: Diagnosis not present

## 2014-05-16 ENCOUNTER — Ambulatory Visit: Payer: Medicare PPO | Admitting: Physical Therapy

## 2014-05-16 ENCOUNTER — Encounter: Payer: Self-pay | Admitting: Physical Therapy

## 2014-05-16 DIAGNOSIS — IMO0001 Reserved for inherently not codable concepts without codable children: Secondary | ICD-10-CM | POA: Diagnosis not present

## 2014-05-21 ENCOUNTER — Ambulatory Visit: Payer: Medicare PPO | Admitting: Physical Therapy

## 2014-05-21 DIAGNOSIS — IMO0001 Reserved for inherently not codable concepts without codable children: Secondary | ICD-10-CM | POA: Diagnosis not present

## 2014-05-23 ENCOUNTER — Encounter (HOSPITAL_COMMUNITY): Payer: Self-pay

## 2014-05-23 ENCOUNTER — Ambulatory Visit (HOSPITAL_COMMUNITY)
Admission: RE | Admit: 2014-05-23 | Discharge: 2014-05-23 | Disposition: A | Discharge status: Home / Self Care | Payer: Medicare PPO | Source: Ambulatory Visit | Attending: Diagnostic Radiology | Admitting: Diagnostic Radiology

## 2014-05-23 DIAGNOSIS — C7951 Secondary malignant neoplasm of bone: Secondary | ICD-10-CM

## 2014-05-23 DIAGNOSIS — I7090 Unspecified atherosclerosis: Secondary | ICD-10-CM | POA: Insufficient documentation

## 2014-05-23 DIAGNOSIS — I2510 Atherosclerotic heart disease of native coronary artery without angina pectoris: Secondary | ICD-10-CM | POA: Diagnosis not present

## 2014-05-23 DIAGNOSIS — R599 Enlarged lymph nodes, unspecified: Secondary | ICD-10-CM | POA: Diagnosis not present

## 2014-05-23 DIAGNOSIS — C61 Malignant neoplasm of prostate: Secondary | ICD-10-CM | POA: Diagnosis not present

## 2014-05-23 MED ORDER — FLUDEOXYGLUCOSE F - 18 (FDG) INJECTION
9.8000 | Freq: Once | INTRAVENOUS | Status: AC | PRN
Start: 2014-05-23 — End: 2014-05-23
  Administered 2014-05-23: 9.8 via INTRAVENOUS

## 2014-05-25 ENCOUNTER — Ambulatory Visit: Payer: Medicare PPO | Admitting: Physical Therapy

## 2014-05-25 DIAGNOSIS — IMO0001 Reserved for inherently not codable concepts without codable children: Secondary | ICD-10-CM | POA: Diagnosis not present

## 2014-05-29 ENCOUNTER — Ambulatory Visit: Payer: Medicare PPO

## 2014-05-29 DIAGNOSIS — IMO0001 Reserved for inherently not codable concepts without codable children: Secondary | ICD-10-CM | POA: Diagnosis not present

## 2014-05-31 ENCOUNTER — Ambulatory Visit: Payer: PRIVATE HEALTH INSURANCE

## 2014-06-04 ENCOUNTER — Ambulatory Visit: Payer: Medicare PPO

## 2014-06-06 ENCOUNTER — Ambulatory Visit: Payer: PRIVATE HEALTH INSURANCE

## 2014-07-24 ENCOUNTER — Encounter: Payer: Self-pay | Admitting: Podiatrist

## 2014-07-24 ENCOUNTER — Ambulatory Visit (INDEPENDENT_AMBULATORY_CARE_PROVIDER_SITE_OTHER): Payer: Commercial Managed Care - HMO | Admitting: Podiatrist

## 2014-07-24 DIAGNOSIS — M24572 Contracture, left ankle: Secondary | ICD-10-CM

## 2014-07-24 DIAGNOSIS — M24575 Contracture, left foot: Secondary | ICD-10-CM

## 2014-07-24 DIAGNOSIS — B351 Tinea unguium: Secondary | ICD-10-CM

## 2014-07-24 DIAGNOSIS — M79676 Pain in unspecified toe(s): Secondary | ICD-10-CM

## 2014-07-24 NOTE — Progress Notes (Signed)
HPI: Patient presents today for follow up of diabetic foot and nail care. Past medical history, meds, and allergies reviewed.  Objective:  GENERAL APPEARANCE: Alert, conversant. Appropriately groomed. No acute distress. Contracture of the left foot at the ankle is noted.  VASCULAR: Pedal pulses palpable at 2/4 DP and PT bilateral. Capillary refill time is immediate to all digits, Proximal to distal cooling it warm to warm. Digital hair growth is present bilateral  NEUROLOGIC: sensation is intact epicritically and protectively to 5.07 monofilament at 3/5 sites bilateral. Light touch is intact bilateral, vibratory sensation intact bilateral, achilles tendon reflex is intact bilateral.  MUSCULOSKELETAL: Flexion contracture is present at the ankle joint and digits on the left foot. Right foot has a mild flexion contracture at the distal digits as well. High arched foot type bilaterally is seen.  DERMATOLOGIC: Small, healing ulceration is present distal tip of the right fourth toe from pressure from the flexor contracture. All toenails are elongated, thickened, dystrophic and clinically mycotic.  Assessment: Diabetes with Neuropathy, symptomatic mycotic toenails , healed ulcer right fourth toe.   Plan: Discussed treatment options and alternatives. Debrided nails without complication. Debrided ulceration right third toe. Discussed that he still needs to watch this very carefully.  Discussed the likely hood of an ankle fusion on the left ankle.  Gave the name of Dr. Donnie Mesa in [facility] for evaluation if he would like.  Return appointment recommended at routine intervals of 3 months.

## 2014-07-24 NOTE — Patient Instructions (Signed)
[facility] I would recommend-- Dr. Gardiner Fanti  [email or web address]   [phone]  The other one is Dr. Shon Baton-- I don't know him personally but have had some patients he has helped out.  He is also affiliated with the [facility] system

## 2014-09-04 ENCOUNTER — Emergency Department (HOSPITAL_COMMUNITY)
Admission: EM | Admit: 2014-09-04 | Discharge: 2014-09-05 | Disposition: A | Discharge status: Other Acute Inpatient Hospital | Payer: Commercial Managed Care - HMO | Attending: Emergency Medicine | Admitting: Emergency Medicine

## 2014-09-04 ENCOUNTER — Emergency Department (HOSPITAL_COMMUNITY): Payer: Commercial Managed Care - HMO

## 2014-09-04 ENCOUNTER — Encounter (HOSPITAL_COMMUNITY): Payer: Self-pay | Admitting: Emergency Medicine

## 2014-09-04 DIAGNOSIS — Z8546 Personal history of malignant neoplasm of prostate: Secondary | ICD-10-CM | POA: Insufficient documentation

## 2014-09-04 DIAGNOSIS — Z87891 Personal history of nicotine dependence: Secondary | ICD-10-CM | POA: Insufficient documentation

## 2014-09-04 DIAGNOSIS — J441 Chronic obstructive pulmonary disease with (acute) exacerbation: Secondary | ICD-10-CM

## 2014-09-04 DIAGNOSIS — Z7952 Long term (current) use of systemic steroids: Secondary | ICD-10-CM | POA: Insufficient documentation

## 2014-09-04 DIAGNOSIS — Z79899 Other long term (current) drug therapy: Secondary | ICD-10-CM | POA: Diagnosis not present

## 2014-09-04 DIAGNOSIS — E119 Type 2 diabetes mellitus without complications: Secondary | ICD-10-CM | POA: Insufficient documentation

## 2014-09-04 DIAGNOSIS — I10 Essential (primary) hypertension: Secondary | ICD-10-CM | POA: Insufficient documentation

## 2014-09-04 DIAGNOSIS — Z7982 Long term (current) use of aspirin: Secondary | ICD-10-CM | POA: Insufficient documentation

## 2014-09-04 DIAGNOSIS — R4182 Altered mental status, unspecified: Secondary | ICD-10-CM | POA: Diagnosis not present

## 2014-09-04 DIAGNOSIS — Z7951 Long term (current) use of inhaled steroids: Secondary | ICD-10-CM | POA: Diagnosis not present

## 2014-09-04 DIAGNOSIS — R509 Fever, unspecified: Secondary | ICD-10-CM

## 2014-09-04 DIAGNOSIS — R609 Edema, unspecified: Secondary | ICD-10-CM | POA: Diagnosis not present

## 2014-09-04 DIAGNOSIS — Z9889 Other specified postprocedural states: Secondary | ICD-10-CM | POA: Insufficient documentation

## 2014-09-04 HISTORY — DX: Malignant neoplasm of prostate: C61

## 2014-09-04 LAB — COMPREHENSIVE METABOLIC PANEL
ALK PHOS: 60 U/L (ref 39–117)
ALT: 5 U/L (ref 0–53)
AST: 22 U/L (ref 0–37)
Albumin: 3.2 g/dL — ABNORMAL LOW (ref 3.5–5.2)
Anion gap: 15 (ref 5–15)
BILIRUBIN TOTAL: 0.4 mg/dL (ref 0.3–1.2)
BUN: 16 mg/dL (ref 6–23)
CO2: 27 meq/L (ref 19–32)
Calcium: 8.9 mg/dL (ref 8.4–10.5)
Chloride: 94 mEq/L — ABNORMAL LOW (ref 96–112)
Creatinine, Ser: 1.29 mg/dL (ref 0.50–1.35)
GFR calc Af Amer: 61 mL/min — ABNORMAL LOW (ref >90)
GFR, EST NON AFRICAN AMERICAN: 53 mL/min — AB (ref >90)
GLUCOSE: 170 mg/dL — AB (ref 70–99)
Potassium: 4 mEq/L (ref 3.7–5.3)
SODIUM: 136 meq/L — AB (ref 137–147)
Total Protein: 6.7 g/dL (ref 6.0–8.3)

## 2014-09-04 LAB — URINALYSIS, ROUTINE W REFLEX MICROSCOPIC
Bilirubin Urine: NEGATIVE
Glucose, UA: NEGATIVE mg/dL
HGB URINE DIPSTICK: NEGATIVE
KETONES UR: 15 mg/dL — AB
Leukocytes, UA: NEGATIVE
Nitrite: NEGATIVE
PROTEIN: NEGATIVE mg/dL
Specific Gravity, Urine: 1.014 (ref 1.005–1.030)
UROBILINOGEN UA: 1 mg/dL (ref 0.0–1.0)
pH: 6 (ref 5.0–8.0)

## 2014-09-04 LAB — CBC
HCT: 37.1 % — ABNORMAL LOW (ref 39.0–52.0)
HEMOGLOBIN: 12.3 g/dL — AB (ref 13.0–17.0)
MCH: 30 pg (ref 26.0–34.0)
MCHC: 33.2 g/dL (ref 30.0–36.0)
MCV: 90.5 fL (ref 78.0–100.0)
Platelets: 211 10*3/uL (ref 150–400)
RBC: 4.1 MIL/uL — AB (ref 4.22–5.81)
RDW: 13.9 % (ref 11.5–15.5)
WBC: 7 10*3/uL (ref 4.0–10.5)

## 2014-09-04 LAB — I-STAT CG4 LACTIC ACID, ED: Lactic Acid, Venous: 1.35 mmol/L (ref 0.5–2.2)

## 2014-09-04 LAB — CBG MONITORING, ED: Glucose-Capillary: 112 mg/dL — ABNORMAL HIGH (ref 70–99)

## 2014-09-04 LAB — PROTIME-INR
INR: 1.1 (ref 0.00–1.49)
PROTHROMBIN TIME: 14.4 s (ref 11.6–15.2)

## 2014-09-04 MED ORDER — PIPERACILLIN-TAZOBACTAM 3.375 G IVPB 30 MIN
3.3750 g | Freq: Once | INTRAVENOUS | Status: AC
  Administered 2014-09-04: 3.375 g via INTRAVENOUS
  Filled 2014-09-04: qty 50

## 2014-09-04 MED ORDER — SODIUM CHLORIDE 0.9 % IV BOLUS (SEPSIS)
1000.0000 mL | INTRAVENOUS | Status: AC
  Administered 2014-09-04 (×3): 1000 mL via INTRAVENOUS

## 2014-09-04 MED ORDER — OXYCODONE-ACETAMINOPHEN 5-325 MG PO TABS
2.0000 | ORAL_TABLET | Freq: Once | ORAL | Status: AC
  Administered 2014-09-04: 2 via ORAL
  Filled 2014-09-04: qty 2

## 2014-09-04 MED ORDER — VANCOMYCIN HCL 10 G IV SOLR
1250.0000 mg | Freq: Two times a day (BID) | INTRAVENOUS | Status: DC
  Filled 2014-09-04: qty 1250

## 2014-09-04 MED ORDER — VANCOMYCIN HCL 10 G IV SOLR
1500.0000 mg | Freq: Once | INTRAVENOUS | Status: AC
  Administered 2014-09-04: 1500 mg via INTRAVENOUS
  Filled 2014-09-04 (×2): qty 1500

## 2014-09-04 MED ORDER — IPRATROPIUM-ALBUTEROL 0.5-2.5 (3) MG/3ML IN SOLN
3.0000 mL | Freq: Once | RESPIRATORY_TRACT | Status: AC
  Administered 2014-09-04: 3 mL via RESPIRATORY_TRACT
  Filled 2014-09-04: qty 3

## 2014-09-04 MED ORDER — ACETAMINOPHEN 325 MG PO TABS
650.0000 mg | ORAL_TABLET | Freq: Four times a day (QID) | ORAL | Status: DC | PRN
  Administered 2014-09-04: 650 mg via ORAL
  Filled 2014-09-04: qty 2

## 2014-09-04 MED ORDER — PIPERACILLIN-TAZOBACTAM 3.375 G IVPB
3.3750 g | Freq: Three times a day (TID) | INTRAVENOUS | Status: DC
  Administered 2014-09-05: 3.375 g via INTRAVENOUS
  Filled 2014-09-04: qty 50

## 2014-09-04 MED ORDER — VANCOMYCIN HCL IN DEXTROSE 1-5 GM/200ML-% IV SOLN: 1000.0000 mg | Freq: Once | INTRAVENOUS | Status: DC

## 2014-09-04 NOTE — ED Notes (Addendum)
Pt from home via [facility] with c/o altered mental status starting yesterday.  Pt post op this past week for drop foot repair surgery.  Pt family called surgeon who advised to come here.  Urinary incontinence during transport. 12 lead unremarkable.  Pt wheezing with hx of COPD, given 2.5 mg albuterol.  Pt A&Ox2 which is abnormal, NAD.

## 2014-09-04 NOTE — ED Notes (Signed)
Called pharmacy for Vancomycin.  Pharmacy to send to POD again.

## 2014-09-04 NOTE — Progress Notes (Addendum)
ANTIBIOTIC CONSULT NOTE - INITIAL  Pharmacy Consult for Vancomycin / Zosyn   Indication: sepsis   No Known Allergies  Patient Measurements: Height: 6' (182.9 cm) Weight: 220 lb (99.791 kg) IBW/kg (Calculated) : 77.6 Adjusted Body Weight:   Vital Signs: Temp: 102.3 F (39.1 C) (12/01 1852) Temp Source: Rectal (12/01 1852) BP: 142/72 mmHg (12/01 1847) Pulse Rate: 115 (12/01 1847) Intake/Output from previous day:   Intake/Output from this shift:    Labs:  Recent Labs  09/04/14 1900  WBC 7.0  HGB 12.3*  PLT 211   Estimated Creatinine Clearance: 79.3 mL/min (by C-G formula based on Cr of 1). No results for input(s): VANCOTROUGH, VANCOPEAK, VANCORANDOM, GENTTROUGH, GENTPEAK, GENTRANDOM, TOBRATROUGH, TOBRAPEAK, TOBRARND, AMIKACINPEAK, AMIKACINTROU, AMIKACIN in the last 72 hours.   Microbiology: No results found for this or any previous visit (from the past 720 hour(s)).  Medical History: Past Medical History  Diagnosis Date  . Cancer   . Hypertension   . Diabetes mellitus   . Hyperlipidemia   . Prostate cancer      Assessment: 79yom s/p r foot surgery this week.  New fever and MS changes at home. WBC wnl, renal fx IP, treat as sepsis protocol.     Goal of Therapy:  Vancomycin trough level 15-20 mcg/ml  Plan:  Zosyn 3.375 GM IV q8h EI Vancomycin 1500 mg IV x1 await lab results for further dosing  Bonnita Nasuti Pharm.D. CPP, BCPS Clinical Pharmacist [phone] 09/04/2014 7:41 PM   PM Addendum Cr 1.29, CrCl 52ml/min  Vancomycin 1250mg  q12  Bonnita Nasuti Pharm.D. CPP, BCPS Clinical Pharmacist [phone] 09/04/2014 8:15 PM

## 2014-09-05 DIAGNOSIS — Z87891 Personal history of nicotine dependence: Secondary | ICD-10-CM | POA: Diagnosis not present

## 2014-09-05 DIAGNOSIS — E119 Type 2 diabetes mellitus without complications: Secondary | ICD-10-CM | POA: Diagnosis not present

## 2014-09-05 DIAGNOSIS — Z7952 Long term (current) use of systemic steroids: Secondary | ICD-10-CM | POA: Diagnosis not present

## 2014-09-05 DIAGNOSIS — R4182 Altered mental status, unspecified: Secondary | ICD-10-CM | POA: Diagnosis not present

## 2014-09-05 DIAGNOSIS — I10 Essential (primary) hypertension: Secondary | ICD-10-CM | POA: Diagnosis not present

## 2014-09-05 DIAGNOSIS — J441 Chronic obstructive pulmonary disease with (acute) exacerbation: Secondary | ICD-10-CM | POA: Diagnosis not present

## 2014-09-05 DIAGNOSIS — Z8546 Personal history of malignant neoplasm of prostate: Secondary | ICD-10-CM | POA: Diagnosis not present

## 2014-09-05 DIAGNOSIS — R509 Fever, unspecified: Secondary | ICD-10-CM | POA: Diagnosis present

## 2014-09-05 DIAGNOSIS — Z7951 Long term (current) use of inhaled steroids: Secondary | ICD-10-CM | POA: Diagnosis not present

## 2014-09-05 DIAGNOSIS — Z7982 Long term (current) use of aspirin: Secondary | ICD-10-CM | POA: Diagnosis not present

## 2014-09-05 DIAGNOSIS — Z9889 Other specified postprocedural states: Secondary | ICD-10-CM | POA: Diagnosis not present

## 2014-09-05 DIAGNOSIS — R609 Edema, unspecified: Secondary | ICD-10-CM | POA: Diagnosis not present

## 2014-09-05 DIAGNOSIS — Z79899 Other long term (current) drug therapy: Secondary | ICD-10-CM | POA: Diagnosis not present

## 2014-09-05 MED ORDER — HALOPERIDOL LACTATE 5 MG/ML IJ SOLN
2.0000 mg | Freq: Once | INTRAMUSCULAR | Status: AC
  Administered 2014-09-05: 2 mg via INTRAVENOUS
  Filled 2014-09-05: qty 1

## 2014-09-05 MED ORDER — LORAZEPAM 2 MG/ML IJ SOLN
0.5000 mg | Freq: Once | INTRAMUSCULAR | Status: AC
  Administered 2014-09-05: 0.5 mg via INTRAVENOUS
  Filled 2014-09-05: qty 1

## 2014-09-05 NOTE — ED Notes (Signed)
Contacted [facility] to tx patient to [facility]:  [place], room 59.   Dr. Flossie Buffy receiving.

## 2014-09-05 NOTE — ED Notes (Signed)
Pt smoking e-cigarette in room, pt informed that he is not to smoke in the hospital

## 2014-09-05 NOTE — ED Provider Notes (Signed)
CSN: [id number]     Arrival date & time 09/04/14  1830 History   First MD Initiated Contact with Patient 09/04/14 1946     Chief Complaint  Patient presents with  . Altered Mental Status  . Post-op Problem  . Fever     (Consider location/radiation/quality/duration/timing/severity/associated sxs/prior Treatment) Patient is a 73 y.o. male presenting with altered mental status and fever. The history is provided by the patient, the EMS personnel and the spouse.  Altered Mental Status Presenting symptoms: confusion   Associated symptoms: fever   Associated symptoms: no abdominal pain, no headaches, no nausea and no vomiting   Fever Associated symptoms: confusion   Associated symptoms: no congestion, no cough, no dysuria, no headaches, no myalgias, no nausea and no vomiting    patient status post orthopedic surgery at [facility] on Friday by Dr. Francee Gentile. Patient with some episodes of confusion that started yesterday. Orthopedic contact today. They thought maybe it was combination of the Percocet and the OxyContin. Recommend stopping the OxyContin. Surgery was for foot drop. EMS brought patient here patient has a history of COPD and he was wheezing when EMS picked him up. Does have home oxygen as well. Patient was given albuterol by EMS with some improvement. For EMS patient was alert and oriented 2. Patient's wife's feels that he still somewhat confused. Patient and family was advised to bring patient here for evaluation by the orthopedic surgeon at [facility]. Patient denies cough any significant pain to the right leg surgical site no evidence of any purulent discharge. Patient denies any dysuria.  Past Medical History  Diagnosis Date  . Cancer   . Hypertension   . Diabetes mellitus   . Hyperlipidemia   . Prostate cancer    Past Surgical History  Procedure Laterality Date  . Cataract extraction w/ intraocular lens implant  2007    bilateral  . Shoulder open rotator cuff repair  2010     left  . Foot surgery      left drop foot repair   History reviewed. No pertinent family history. History  Substance Use Topics  . Smoking status: Former Smoker    Quit date: 04/11/2005  . Smokeless tobacco: Never Used  . Alcohol Use: No    Review of Systems  Constitutional: Positive for fever.  HENT: Negative for congestion.   Eyes: Negative for redness.  Respiratory: Positive for shortness of breath and wheezing. Negative for cough.   Gastrointestinal: Negative for nausea, vomiting and abdominal pain.  Genitourinary: Negative for dysuria.  Musculoskeletal: Negative for myalgias.  Skin: Positive for wound.  Neurological: Negative for headaches.  Hematological: Does not bruise/bleed easily.  Psychiatric/Behavioral: Positive for confusion.      Allergies  Review of patient's allergies indicates no known allergies.  Home Medications   Prior to Admission medications   Medication Sig Start Date End Date Taking? Authorizing Provider  albuterol (PROVENTIL) (2.5 MG/3ML) 0.083% nebulizer solution Take 2.5 mg by nebulization every 6 (six) hours as needed for wheezing or shortness of breath.    Yes Historical Provider, MD  aspirin 325 MG tablet Take 325 mg by mouth daily.   Yes Historical Provider, MD  budesonide-formoterol (SYMBICORT) 160-4.5 MCG/ACT inhaler Inhale 2 puffs into the lungs 2 (two) times daily. 07/07/13  Yes Orson Eva, MD  cholecalciferol (VITAMIN D) 1000 UNITS tablet Take 1,000 Units by mouth daily.   Yes Historical Provider, MD  diazepam (VALIUM) 5 MG tablet 5 mg every 8 (eight) hours as needed for anxiety.  03/14/14  Yes Historical Provider, MD  finasteride (PROSCAR) 5 MG tablet Take 5 mg by mouth daily.   Yes Historical Provider, MD  FLUoxetine (PROZAC) 40 MG capsule Take 40 mg by mouth every morning.   Yes Historical Provider, MD  glipiZIDE (GLUCOTROL) 10 MG tablet Take 10 mg by mouth 2 (two) times daily before a meal.   Yes Historical Provider, MD   hydrochlorothiazide (HYDRODIURIL) 25 MG tablet Take 25 mg by mouth every morning.   Yes Historical Provider, MD  hydroxypropyl methylcellulose (ISOPTO TEARS) 2.5 % ophthalmic solution Place 1 drop into both eyes as needed (for dryness).    Yes Historical Provider, MD  lisinopril (PRINIVIL,ZESTRIL) 20 MG tablet Take 10 mg by mouth every morning.   Yes Historical Provider, MD  metFORMIN (GLUCOPHAGE) 1000 MG tablet Take 1,000 mg by mouth 2 (two) times daily with a meal.   Yes Historical Provider, MD  tiotropium (SPIRIVA) 18 MCG inhalation capsule Place 18 mcg into inhaler and inhale daily.   Yes Historical Provider, MD  predniSONE (DELTASONE) 10 MG tablet Take 5 tablets (50 mg total) by mouth daily with breakfast. Start 07/08/13.  Decrease by one tablet daily until gone Patient not taking: Reported on 09/04/2014 07/07/13   Orson Eva, MD   BP 136/54 mmHg  Pulse 91  Temp(Src) 99.5 F (37.5 C) (Core (Comment))  Resp 17  Ht 6' (1.829 m)  Wt 220 lb (99.791 kg)  BMI 29.83 kg/m2  SpO2 95% Physical Exam  Constitutional: He appears well-developed and well-nourished. No distress.  HENT:  Head: Normocephalic and atraumatic.  Mouth/Throat: Oropharynx is clear and moist.  Eyes: Conjunctivae are normal. Pupils are equal, round, and reactive to light.  Neck: Normal range of motion. Neck supple.  Pulmonary/Chest: Effort normal. No respiratory distress. He has wheezes.  Abdominal: Soft. Bowel sounds are normal. There is no tenderness.  Musculoskeletal: He exhibits edema.  Patient with external fixator to of left lower leg and foot. No purulent drainage or evidence of significant erythema there is some swelling. Cap refill to the toes is 1 second. No evidence of significant infection.  Neurological: He is alert. No cranial nerve deficit. Coordination normal.  Skin: Skin is warm. No erythema.  Nursing note and vitals reviewed.   ED Course  Procedures (including critical care time) Labs Review Labs  Reviewed  CBC - Abnormal; Notable for the following:    RBC 4.10 (*)    Hemoglobin 12.3 (*)    HCT 37.1 (*)    All other components within normal limits  COMPREHENSIVE METABOLIC PANEL - Abnormal; Notable for the following:    Sodium 136 (*)    Chloride 94 (*)    Glucose, Bld 170 (*)    Albumin 3.2 (*)    GFR calc non Af Amer 53 (*)    GFR calc Af Amer 61 (*)    All other components within normal limits  URINALYSIS, ROUTINE W REFLEX MICROSCOPIC - Abnormal; Notable for the following:    Ketones, ur 15 (*)    All other components within normal limits  CBG MONITORING, ED - Abnormal; Notable for the following:    Glucose-Capillary 112 (*)    All other components within normal limits  CULTURE, BLOOD (ROUTINE X 2)  CULTURE, BLOOD (ROUTINE X 2)  URINE CULTURE  PROTIME-INR  I-STAT CG4 LACTIC ACID, ED   Results for orders placed or performed during the hospital encounter of 09/04/14  CBC  Result Value Ref Range   WBC 7.0  4.0 - 10.5 K/uL   RBC 4.10 (L) 4.22 - 5.81 MIL/uL   Hemoglobin 12.3 (L) 13.0 - 17.0 g/dL   HCT 37.1 (L) 39.0 - 52.0 %   MCV 90.5 78.0 - 100.0 fL   MCH 30.0 26.0 - 34.0 pg   MCHC 33.2 30.0 - 36.0 g/dL   RDW 13.9 11.5 - 15.5 %   Platelets 211 150 - 400 K/uL  Comprehensive metabolic panel  Result Value Ref Range   Sodium 136 (L) 137 - 147 mEq/L   Potassium 4.0 3.7 - 5.3 mEq/L   Chloride 94 (L) 96 - 112 mEq/L   CO2 27 19 - 32 mEq/L   Glucose, Bld 170 (H) 70 - 99 mg/dL   BUN 16 6 - 23 mg/dL   Creatinine, Ser 1.29 0.50 - 1.35 mg/dL   Calcium 8.9 8.4 - 10.5 mg/dL   Total Protein 6.7 6.0 - 8.3 g/dL   Albumin 3.2 (L) 3.5 - 5.2 g/dL   AST 22 0 - 37 U/L   ALT 5 0 - 53 U/L   Alkaline Phosphatase 60 39 - 117 U/L   Total Bilirubin 0.4 0.3 - 1.2 mg/dL   GFR calc non Af Amer 53 (L) >90 mL/min   GFR calc Af Amer 61 (L) >90 mL/min   Anion gap 15 5 - 15  Urinalysis, Routine w reflex microscopic  Result Value Ref Range   Color, Urine YELLOW YELLOW   APPearance CLEAR  CLEAR   Specific Gravity, Urine 1.014 1.005 - 1.030   pH 6.0 5.0 - 8.0   Glucose, UA NEGATIVE NEGATIVE mg/dL   Hgb urine dipstick NEGATIVE NEGATIVE   Bilirubin Urine NEGATIVE NEGATIVE   Ketones, ur 15 (A) NEGATIVE mg/dL   Protein, ur NEGATIVE NEGATIVE mg/dL   Urobilinogen, UA 1.0 0.0 - 1.0 mg/dL   Nitrite NEGATIVE NEGATIVE   Leukocytes, UA NEGATIVE NEGATIVE  Protime-INR  Result Value Ref Range   Prothrombin Time 14.4 11.6 - 15.2 seconds   INR 1.10 0.00 - 1.49  I-Stat CG4 Lactic Acid, ED  Result Value Ref Range   Lactic Acid, Venous 1.35 0.5 - 2.2 mmol/L  CBG monitoring, ED  Result Value Ref Range   Glucose-Capillary 112 (H) 70 - 99 mg/dL    Imaging Review Dg Chest Port 1 View  09/04/2014   CLINICAL DATA:  Status post foot and ankle surgery 4 days ago. Fever and altered mental status beginning today.  EXAM: PORTABLE CHEST - 1 VIEW  COMPARISON:  PA and lateral chest 07/04/2013. PET CT scan 05/23/2014.  FINDINGS: The lungs are emphysematous but clear. Heart size is upper normal. There is no pneumothorax or pleural effusion. No focal bony abnormality is identified.  IMPRESSION: Emphysema without acute disease.   Electronically Signed   By: Inge Rise M.D.   On: 09/04/2014 20:40     EKG Interpretation   Date/Time:  Tuesday September 04 2014 19:09:38 EST Ventricular Rate:  105 PR Interval:  165 QRS Duration: 111 QT Interval:  335 QTC Calculation: 443 R Axis:   78 Text Interpretation:  Sinus tachycardia No significant change since last  tracing Confirmed by Tore Carreker  MD, Myrel Rappleye [phone]) on 09/04/2014 7:22:01 PM      CRITICAL CARE Performed by: Fredia Sorrow Total critical care time: 30 Critical care time was exclusive of separately billable procedures and treating other patients. Critical care was necessary to treat or prevent imminent or life-threatening deterioration. Critical care was time spent personally by me on the following activities:  development of treatment  plan with patient and/or surrogate as well as nursing, discussions with consultants, evaluation of patient's response to treatment, examination of patient, obtaining history from patient or surrogate, ordering and performing treatments and interventions, ordering and review of laboratory studies, ordering and review of radiographic studies, pulse oximetry and re-evaluation of patient's condition.   MDM   Final diagnoses:  COPD exacerbation  Other specified fever    The patient presented with concerns for sepsis. Patient was tachycardic at a little bit of confusion and febrile to 102. Patient's postoperative status post foot surgery at [facility] on Friday. Patient was cultured and started on IV fluids with bolus and started on broad-spectrum antibiotics Zosyn and vancomycin. Has workup proceeded as fever improved with Tylenol it appeared that it was not consistent with sepsis. Patient's lactic acid was less than 2 chest x-ray was negative for pneumonia urinalysis negative for urinary tract infection. No leukocytosis. Possible that some of the altered mental status could be related to the fever plus OxyContin. Which was recommended to be stopped by his orthopedic doctor.  Patient also was wheezing on arrival and does have a history of COPD. Patient treated with albuterol nebulizer by EMS and retreated with a second albuterol Atrovent nebulizer here. Wheezing has resolved and stayed away. It is possible patient may be developing a delayed pneumonia but no evidence of that on chest x-ray here.  Patient's been observed now for a while and has remained stable. Patient is normally on oxygen when needed and will be continued on oxygen. No significant mental status changes no sniffing neuro focal deficits. With workup being negative is possible that the orthopedic site with the external fixation could possibly the source of the fever.  Discussed with orthopedics at [facility] where he was operated on. They're  willing to consult on the patient. Contacted internal medicine service there they will admit and observe and have orthopedic follow them up. Patient will be transported by [facility]. Patient aware and family aware of the need for transfer.    Fredia Sorrow, MD 09/05/14 [phone]

## 2014-09-06 LAB — URINE CULTURE
Colony Count: NO GROWTH
Culture: NO GROWTH

## 2014-09-11 ENCOUNTER — Other Ambulatory Visit: Payer: Self-pay | Admitting: *Deleted

## 2014-09-11 ENCOUNTER — Non-Acute Institutional Stay (SKILLED_NURSING_FACILITY): Payer: Commercial Managed Care - HMO | Admitting: Internal Medicine

## 2014-09-11 DIAGNOSIS — T50905A Adverse effect of unspecified drugs, medicaments and biological substances, initial encounter: Secondary | ICD-10-CM

## 2014-09-11 DIAGNOSIS — F19921 Other psychoactive substance use, unspecified with intoxication with delirium: Secondary | ICD-10-CM

## 2014-09-11 DIAGNOSIS — J441 Chronic obstructive pulmonary disease with (acute) exacerbation: Secondary | ICD-10-CM

## 2014-09-11 DIAGNOSIS — E1140 Type 2 diabetes mellitus with diabetic neuropathy, unspecified: Secondary | ICD-10-CM

## 2014-09-11 LAB — CULTURE, BLOOD (ROUTINE X 2)
Culture: NO GROWTH
Culture: NO GROWTH

## 2014-09-11 MED ORDER — OXYCODONE-ACETAMINOPHEN 5-325 MG PO TABS: ORAL_TABLET | ORAL | Status: DC

## 2014-09-11 NOTE — Telephone Encounter (Signed)
[facility] 

## 2014-09-13 NOTE — Progress Notes (Addendum)
Patient ID: [name] [name], male   DOB: September 04, 1941, 73 y.o.   MRN: [id number]               HISTORY & PHYSICAL  DATE:  09/11/2014    FACILITY: [facility]    LEVEL OF CARE:   SNF   CHIEF COMPLAINT:  Transfer to SNF, post stay at [facility], 09/05/2014 through 09/10/2014.    HISTORY OF PRESENT ILLNESS:  This patient underwent surgery on 08/31/2014 at [facility] for repair of a left ankle contracture and dropfoot which apparently was secondary to "spinal pathology".  He was discharged home the next day.    I see that he was seen at [facility] on 09/04/2014 for a COPD exacerbation.  His lab work at that time showed a BUN of 16, creatinine of 1.29, white count of 7, hemoglobin of 12.3.  Urinalysis was negative.  Urine culture was negative.  Blood culture was negative.  Chest x-ray showed emphysema without acute disease.    He was admitted to hospital at this point secondary to altered mental status.  He had some form of nerve block for pain, after which he started becoming confused.  He was brought to [facility] ER.  He was febrile and tachycardic.  Chest x-ray showed chronic emphysematous changes.  CBC showed no leukocytosis.  He was given 3 L of normal saline, a dose of vancomycin and Zosyn.  He was transferred to [facility], blood and urine cultures were negative.  Chest x-ray was not compatible with pneumonia.  His left leg, with the recent surgery and an external fixator, did not look infected.  Venous blood gas was without evidence of hypoxia or hypercarbia.  The patient was found to be taking 75 mg of Benadryl and high-dose narcotics.  These were revised and his mental status spontaneously resolved.  He was seen by Orthopedics, who did not feel that anything looked infected in the external hardware.  He is non-weightbearing.  He follows up with a Dr. Nicki Reaper on 09/18/2014.  It was noted that he also was being noncompliant with his  non-weightbearing.    PAST MEDICAL HISTORY/PROBLEM LIST:   Includes:    COPD.  On episodic, but chronic, oxygen at home.  Also uses nebulizers.    History of coronary artery disease.    Hypertension.    Type 2 diabetes.    History of prostate cancer with a PET scan on 05/23/2014 showing no abnormal areas of increased uptake in the skeleton, but enlarged bilateral external iliac lymph nodes which are worrisome for metastatic adenopathy.    Right shoulder pain which was not traumatic, felt secondary to arthritis.    CURRENT MEDICATIONS:  Medication list is reviewed.      Tylenol 650 every 6 hours.     Protonix 40 q.d.    MiraLAX 17 g daily.    Peri-Colace 2 tablets nightly.    Percocet 1 every 4-6 hours p.r.n.     Albuterol inhaled 2 puffs into the lungs every 4 hours as needed.    ASA 325 q.d.    Colace 100 q.d.    Proscar 5 q.d.    Prozac 40 q.d.    Glucotrol 5 mg b.i.d.     Hydrochlorothiazide 25 q.d.    Lisinopril 10 mg daily.    Metformin (Glumetza) 1000 by mouth daily.    Spiriva 18 mcg inhaler daily.    Levitra 20 mg p.r.n.  SOCIAL HISTORY:     HOUSING:  The patient lives in [place] with his wife.   FUNCTIONAL STATUS:  He has a ramp.  He has a wheelchair and a walker.  Has been using the walker mostly for the last year.  Was on oxygen and nebulizers at home.  Tells me his original injury was to the ankle after a fall last fall, around Labor Day of 2014.   I do not really see this recorded.  I note that he has been seen in the wound care center for wounds on his foot.  He was followed by Podiatry.     TOBACCO USE:  He is not currently smoking.    REVIEW OF SYSTEMS:   CHEST/RESPIRATORY:  Quite short of breath on minimal exertion.  No clear cough or productive sputum.   CARDIAC:   No chest pain.   GI:  States his abdomen is chronically distended.  States he had a normal bowel movement yesterday. GU:  No voiding difficulties.    PHYSICAL  EXAMINATION:   VITAL SIGNS:   O2 SATURATIONS:  95% on 2 L.   RESPIRATIONS:  24.   PULSE:  71 and regular.   GENERAL APPEARANCE:  The patient is somewhat tachypneic.  However, he appears to be alert and orientated.   CHEST/RESPIRATORY:  Decreased air entry bilaterally.   Markedly prolonged expiratory phase with expiratory wheezing.   CARDIOVASCULAR:  CARDIAC:  Heart sounds are almost inaudible.  His JVP is not elevated.    GASTROINTESTINAL:  ABDOMEN:   Distended.  This is mostly upper abdominal distention.  There is no tenderness.  Bowel sounds are markedly reduced and quiet.   HERNIA:  He probably has a small ventral hernia.   LIVER/SPLEEN/KIDNEYS:   I cannot feel a liver or spleen.  His percussion is tympanic.  There is no obvious fluid wave at the bedside.   GENITOURINARY:  BLADDER:   Not obviously distended.  There is no suprapubic tenderness or costovertebral angle tenderness.   MUSCULOSKELETAL:    EXTREMITIES:   LEFT LOWER EXTREMITY:  He has an external fixator on the left leg to one-third up.  Multiple screws are in place, including his toes.  There does not appear to be any infection here.  There is some swelling and warmth in the left leg, extending up to the knee.  I am somewhat concerned about the possibility of a venous thromboembolism.   NEUROLOGICAL:    SENSATION/STRENGTH:  He has good strength in the right leg and both arms.   DEEP TENDON REFLEXES:  Reflexes are absent diffusely, suggesting the possibility of diabetic neuropathy.   PSYCHIATRIC:   MENTAL STATUS:   I see no obvious abnormalities here.  He does not seem very aware of his medications, referring to his wife.  However, he appears to have good attention span.  I do not sense any major cognitive difficulties.  No overt depression.    ASSESSMENT/PLAN:                          Acute delirium.  Admitted to hospital.  Felt to be secondary to a combination of narcotics and Benadryl.  I am not exactly certain why he was  taking the Benadryl.  Stopping these seems to have resolved this problem.  I do not think there were underlying cognitive issues here, at least superficially.    Concern for a left leg DVT.   He will need  a duplex ultrasound.    Severe COPD.  He is very bronchospastic today.  I am going to start him on nebulizers routinely.  He may needs steroids.  There is really no reason to believe he has an infection, although a chest x-ray from 09/05/2014 suggested diffuse interstitial prominence, felt to be pulmonary edema versus atypical infection.  He did not come out on diuretics, however.  There was a chest x-ray from the [facility] ER appearance on December 1st which was unremarkable other than COPD.    History of prostate cancer with at least a PET scan from August suggesting external iliac node enlargement.  I will try to check into this at some point if he remains here for a more prolonged stay  Type 2 diabetes.  On Glumetza.  I am not exactly certain about this brand of metformin.  In fact, I have never heard of it.  It is apparently obscenely expensive.  I will just change him to regular long-acting metformin at 1000 mg daily.  There is no hemoglobin A1c.    Abdominal distention.  It was noted in the hospital that he seemed to have some problems with constipation/impaction.  The patient states he had a good large bowel movement yesterday.  I would also be somewhat concerned about ileus.    Rule out DVT of the left leg.    Hypertension.  We will monitor.   Recent surgery on the left ankle, now with an external fixator, I am not certain who is following this at [facility].

## 2014-09-18 ENCOUNTER — Non-Acute Institutional Stay (SKILLED_NURSING_FACILITY): Payer: Commercial Managed Care - HMO | Admitting: Internal Medicine

## 2014-09-18 DIAGNOSIS — J441 Chronic obstructive pulmonary disease with (acute) exacerbation: Secondary | ICD-10-CM

## 2014-09-20 NOTE — Progress Notes (Addendum)
Patient ID: [name] [name], male   DOB: 1940-12-13, 73 y.o.   MRN: [id number]               PROGRESS NOTE  DATE:  09/18/2014    FACILITY: [facility]     LEVEL OF CARE:   SNF   Acute Visit   CHIEF COMPLAINT:  Follow up medical issues, including COPD.    HISTORY OF PRESENT ILLNESS:  This is a patient who came to [place] after surgery on his left ankle at [facility].  This was largely secondary to contracturing, dropfoot.  He came to [place] with an external fixator over the surgical site.      I was concerned about two things last week which included really marked bronchospasm and also edema of his left leg extending into his thigh.  His mental status, which had been the issue causing hospitalization, had actually cleared, felt to be delirium secondary to narcotics and Benadryl.    REVIEW OF SYSTEMS:   CHEST/RESPIRATORY:  He states his breathing is a lot better.   CARDIAC:   No chest pain.   MUSCULOSKELETAL:  Extremities:  His fixator is causing minimal discomfort.  His wife is concerned about drainage from one of the pins, although I do not see anything concerning here.    PHYSICAL EXAMINATION:   CIRCULATION:  EDEMA/VARICOSITIES:  Edema of the left leg is still present up to above his knee.   CHEST/RESPIRATORY:  Shallow air entry, but much clearer with no wheezing present.    ASSESSMENT/PLAN:                          COPD.  I am giving him regular nebulizers.  According to his wife, he is on Spiriva at home and nebulizers.  He is still on that.  I wonder why he is not on steroids/LABA.  However, I will leave this for now.     Swelling of left leg.  This all might be postoperative.  I did order a duplex ultrasound.  I will see if I can track down the results.

## 2014-10-12 DIAGNOSIS — F1021 Alcohol dependence, in remission: Secondary | ICD-10-CM | POA: Diagnosis not present

## 2014-10-12 DIAGNOSIS — M1990 Unspecified osteoarthritis, unspecified site: Secondary | ICD-10-CM | POA: Diagnosis not present

## 2014-10-12 DIAGNOSIS — I10 Essential (primary) hypertension: Secondary | ICD-10-CM | POA: Diagnosis not present

## 2014-10-12 DIAGNOSIS — J449 Chronic obstructive pulmonary disease, unspecified: Secondary | ICD-10-CM | POA: Diagnosis not present

## 2014-10-12 DIAGNOSIS — M25572 Pain in left ankle and joints of left foot: Secondary | ICD-10-CM | POA: Diagnosis not present

## 2014-10-12 DIAGNOSIS — E11621 Type 2 diabetes mellitus with foot ulcer: Secondary | ICD-10-CM | POA: Diagnosis not present

## 2014-10-12 DIAGNOSIS — E782 Mixed hyperlipidemia: Secondary | ICD-10-CM | POA: Diagnosis not present

## 2014-10-12 DIAGNOSIS — E119 Type 2 diabetes mellitus without complications: Secondary | ICD-10-CM | POA: Diagnosis not present

## 2014-10-12 DIAGNOSIS — Z008 Encounter for other general examination: Secondary | ICD-10-CM | POA: Diagnosis not present

## 2014-10-22 DIAGNOSIS — I10 Essential (primary) hypertension: Secondary | ICD-10-CM | POA: Diagnosis not present

## 2014-10-22 DIAGNOSIS — E119 Type 2 diabetes mellitus without complications: Secondary | ICD-10-CM | POA: Diagnosis not present

## 2014-10-22 DIAGNOSIS — M21372 Foot drop, left foot: Secondary | ICD-10-CM | POA: Diagnosis not present

## 2014-10-22 DIAGNOSIS — C61 Malignant neoplasm of prostate: Secondary | ICD-10-CM | POA: Diagnosis not present

## 2014-10-26 ENCOUNTER — Ambulatory Visit: Payer: Commercial Managed Care - HMO

## 2014-10-31 DIAGNOSIS — J449 Chronic obstructive pulmonary disease, unspecified: Secondary | ICD-10-CM | POA: Insufficient documentation

## 2014-10-31 DIAGNOSIS — I10 Essential (primary) hypertension: Secondary | ICD-10-CM | POA: Insufficient documentation

## 2014-10-31 DIAGNOSIS — E119 Type 2 diabetes mellitus without complications: Secondary | ICD-10-CM | POA: Insufficient documentation

## 2014-11-02 DIAGNOSIS — I10 Essential (primary) hypertension: Secondary | ICD-10-CM | POA: Diagnosis not present

## 2014-11-02 DIAGNOSIS — M216X2 Other acquired deformities of left foot: Secondary | ICD-10-CM | POA: Diagnosis not present

## 2014-11-02 DIAGNOSIS — M21962 Unspecified acquired deformity of left lower leg: Secondary | ICD-10-CM | POA: Diagnosis not present

## 2014-11-02 DIAGNOSIS — E119 Type 2 diabetes mellitus without complications: Secondary | ICD-10-CM | POA: Diagnosis not present

## 2014-11-02 DIAGNOSIS — N529 Male erectile dysfunction, unspecified: Secondary | ICD-10-CM | POA: Diagnosis not present

## 2014-11-02 DIAGNOSIS — F17210 Nicotine dependence, cigarettes, uncomplicated: Secondary | ICD-10-CM | POA: Diagnosis not present

## 2014-11-02 DIAGNOSIS — M4806 Spinal stenosis, lumbar region: Secondary | ICD-10-CM | POA: Diagnosis not present

## 2014-11-02 DIAGNOSIS — Z4689 Encounter for fitting and adjustment of other specified devices: Secondary | ICD-10-CM | POA: Diagnosis not present

## 2014-11-02 DIAGNOSIS — M24572 Contracture, left ankle: Secondary | ICD-10-CM | POA: Diagnosis not present

## 2014-11-02 DIAGNOSIS — J449 Chronic obstructive pulmonary disease, unspecified: Secondary | ICD-10-CM | POA: Diagnosis not present

## 2014-11-05 DIAGNOSIS — Z4789 Encounter for other orthopedic aftercare: Secondary | ICD-10-CM | POA: Diagnosis not present

## 2014-11-05 DIAGNOSIS — I10 Essential (primary) hypertension: Secondary | ICD-10-CM | POA: Diagnosis not present

## 2014-11-05 DIAGNOSIS — C61 Malignant neoplasm of prostate: Secondary | ICD-10-CM | POA: Diagnosis not present

## 2014-11-05 DIAGNOSIS — F339 Major depressive disorder, recurrent, unspecified: Secondary | ICD-10-CM | POA: Diagnosis not present

## 2014-11-05 DIAGNOSIS — E119 Type 2 diabetes mellitus without complications: Secondary | ICD-10-CM | POA: Diagnosis not present

## 2014-11-05 DIAGNOSIS — J449 Chronic obstructive pulmonary disease, unspecified: Secondary | ICD-10-CM | POA: Diagnosis not present

## 2014-11-05 DIAGNOSIS — R262 Difficulty in walking, not elsewhere classified: Secondary | ICD-10-CM | POA: Diagnosis not present

## 2014-11-05 DIAGNOSIS — I2510 Atherosclerotic heart disease of native coronary artery without angina pectoris: Secondary | ICD-10-CM | POA: Diagnosis not present

## 2014-11-05 DIAGNOSIS — M4806 Spinal stenosis, lumbar region: Secondary | ICD-10-CM | POA: Diagnosis not present

## 2014-11-16 DIAGNOSIS — E119 Type 2 diabetes mellitus without complications: Secondary | ICD-10-CM | POA: Diagnosis not present

## 2014-11-16 DIAGNOSIS — Z4889 Encounter for other specified surgical aftercare: Secondary | ICD-10-CM | POA: Diagnosis not present

## 2014-11-16 DIAGNOSIS — R262 Difficulty in walking, not elsewhere classified: Secondary | ICD-10-CM | POA: Diagnosis not present

## 2014-11-16 DIAGNOSIS — I2510 Atherosclerotic heart disease of native coronary artery without angina pectoris: Secondary | ICD-10-CM | POA: Diagnosis not present

## 2014-11-16 DIAGNOSIS — M4806 Spinal stenosis, lumbar region: Secondary | ICD-10-CM | POA: Diagnosis not present

## 2014-11-16 DIAGNOSIS — F339 Major depressive disorder, recurrent, unspecified: Secondary | ICD-10-CM | POA: Diagnosis not present

## 2014-11-16 DIAGNOSIS — Z4789 Encounter for other orthopedic aftercare: Secondary | ICD-10-CM | POA: Diagnosis not present

## 2014-11-16 DIAGNOSIS — I10 Essential (primary) hypertension: Secondary | ICD-10-CM | POA: Diagnosis not present

## 2014-11-16 DIAGNOSIS — C61 Malignant neoplasm of prostate: Secondary | ICD-10-CM | POA: Diagnosis not present

## 2014-11-16 DIAGNOSIS — Z9889 Other specified postprocedural states: Secondary | ICD-10-CM | POA: Insufficient documentation

## 2014-11-16 DIAGNOSIS — J449 Chronic obstructive pulmonary disease, unspecified: Secondary | ICD-10-CM | POA: Diagnosis not present

## 2014-11-22 DIAGNOSIS — E119 Type 2 diabetes mellitus without complications: Secondary | ICD-10-CM | POA: Diagnosis not present

## 2014-11-22 DIAGNOSIS — M21372 Foot drop, left foot: Secondary | ICD-10-CM | POA: Diagnosis not present

## 2014-11-22 DIAGNOSIS — C61 Malignant neoplasm of prostate: Secondary | ICD-10-CM | POA: Diagnosis not present

## 2014-11-22 DIAGNOSIS — M216X2 Other acquired deformities of left foot: Secondary | ICD-10-CM | POA: Diagnosis not present

## 2014-11-22 DIAGNOSIS — Z4889 Encounter for other specified surgical aftercare: Secondary | ICD-10-CM | POA: Diagnosis not present

## 2014-11-22 DIAGNOSIS — Z4789 Encounter for other orthopedic aftercare: Secondary | ICD-10-CM | POA: Diagnosis not present

## 2014-11-22 DIAGNOSIS — I10 Essential (primary) hypertension: Secondary | ICD-10-CM | POA: Diagnosis not present

## 2014-12-03 DIAGNOSIS — J449 Chronic obstructive pulmonary disease, unspecified: Secondary | ICD-10-CM | POA: Diagnosis not present

## 2014-12-10 DIAGNOSIS — R591 Generalized enlarged lymph nodes: Secondary | ICD-10-CM | POA: Diagnosis not present

## 2014-12-10 DIAGNOSIS — C61 Malignant neoplasm of prostate: Secondary | ICD-10-CM | POA: Diagnosis not present

## 2014-12-10 DIAGNOSIS — C7951 Secondary malignant neoplasm of bone: Secondary | ICD-10-CM | POA: Diagnosis not present

## 2014-12-17 DIAGNOSIS — M216X2 Other acquired deformities of left foot: Secondary | ICD-10-CM | POA: Diagnosis not present

## 2014-12-17 DIAGNOSIS — M24572 Contracture, left ankle: Secondary | ICD-10-CM | POA: Diagnosis not present

## 2014-12-17 DIAGNOSIS — Z4889 Encounter for other specified surgical aftercare: Secondary | ICD-10-CM | POA: Diagnosis not present

## 2014-12-21 DIAGNOSIS — I10 Essential (primary) hypertension: Secondary | ICD-10-CM | POA: Diagnosis not present

## 2014-12-21 DIAGNOSIS — C61 Malignant neoplasm of prostate: Secondary | ICD-10-CM | POA: Diagnosis not present

## 2014-12-21 DIAGNOSIS — M21372 Foot drop, left foot: Secondary | ICD-10-CM | POA: Diagnosis not present

## 2014-12-21 DIAGNOSIS — E119 Type 2 diabetes mellitus without complications: Secondary | ICD-10-CM | POA: Diagnosis not present

## 2015-01-01 DIAGNOSIS — L89622 Pressure ulcer of left heel, stage 2: Secondary | ICD-10-CM | POA: Diagnosis not present

## 2015-01-01 DIAGNOSIS — M791 Myalgia: Secondary | ICD-10-CM | POA: Diagnosis not present

## 2015-01-01 DIAGNOSIS — J449 Chronic obstructive pulmonary disease, unspecified: Secondary | ICD-10-CM | POA: Diagnosis not present

## 2015-01-01 DIAGNOSIS — I10 Essential (primary) hypertension: Secondary | ICD-10-CM | POA: Diagnosis not present

## 2015-01-01 DIAGNOSIS — T8132XA Disruption of internal operation (surgical) wound, not elsewhere classified, initial encounter: Secondary | ICD-10-CM | POA: Diagnosis not present

## 2015-01-01 DIAGNOSIS — M1990 Unspecified osteoarthritis, unspecified site: Secondary | ICD-10-CM | POA: Diagnosis not present

## 2015-01-01 DIAGNOSIS — Z87891 Personal history of nicotine dependence: Secondary | ICD-10-CM | POA: Diagnosis not present

## 2015-01-08 DIAGNOSIS — L97909 Non-pressure chronic ulcer of unspecified part of unspecified lower leg with unspecified severity: Secondary | ICD-10-CM | POA: Diagnosis not present

## 2015-01-08 DIAGNOSIS — L89622 Pressure ulcer of left heel, stage 2: Secondary | ICD-10-CM | POA: Diagnosis not present

## 2015-01-08 DIAGNOSIS — T8132XA Disruption of internal operation (surgical) wound, not elsewhere classified, initial encounter: Secondary | ICD-10-CM | POA: Diagnosis not present

## 2015-01-08 DIAGNOSIS — T8132XD Disruption of internal operation (surgical) wound, not elsewhere classified, subsequent encounter: Secondary | ICD-10-CM | POA: Diagnosis not present

## 2015-01-11 DIAGNOSIS — C7951 Secondary malignant neoplasm of bone: Secondary | ICD-10-CM | POA: Diagnosis not present

## 2015-01-11 DIAGNOSIS — C61 Malignant neoplasm of prostate: Secondary | ICD-10-CM | POA: Diagnosis not present

## 2015-01-15 DIAGNOSIS — T8132XA Disruption of internal operation (surgical) wound, not elsewhere classified, initial encounter: Secondary | ICD-10-CM | POA: Diagnosis not present

## 2015-01-15 DIAGNOSIS — T8132XD Disruption of internal operation (surgical) wound, not elsewhere classified, subsequent encounter: Secondary | ICD-10-CM | POA: Diagnosis not present

## 2015-01-15 DIAGNOSIS — L89622 Pressure ulcer of left heel, stage 2: Secondary | ICD-10-CM | POA: Diagnosis not present

## 2015-01-16 DIAGNOSIS — L97909 Non-pressure chronic ulcer of unspecified part of unspecified lower leg with unspecified severity: Secondary | ICD-10-CM | POA: Diagnosis not present

## 2015-01-21 DIAGNOSIS — M21372 Foot drop, left foot: Secondary | ICD-10-CM | POA: Diagnosis not present

## 2015-01-21 DIAGNOSIS — I10 Essential (primary) hypertension: Secondary | ICD-10-CM | POA: Diagnosis not present

## 2015-01-21 DIAGNOSIS — E119 Type 2 diabetes mellitus without complications: Secondary | ICD-10-CM | POA: Diagnosis not present

## 2015-01-21 DIAGNOSIS — C61 Malignant neoplasm of prostate: Secondary | ICD-10-CM | POA: Diagnosis not present

## 2015-01-22 DIAGNOSIS — T8132XD Disruption of internal operation (surgical) wound, not elsewhere classified, subsequent encounter: Secondary | ICD-10-CM | POA: Diagnosis not present

## 2015-01-22 DIAGNOSIS — L89622 Pressure ulcer of left heel, stage 2: Secondary | ICD-10-CM | POA: Diagnosis not present

## 2015-01-24 DIAGNOSIS — I10 Essential (primary) hypertension: Secondary | ICD-10-CM | POA: Diagnosis not present

## 2015-01-24 DIAGNOSIS — E11621 Type 2 diabetes mellitus with foot ulcer: Secondary | ICD-10-CM | POA: Diagnosis not present

## 2015-01-24 DIAGNOSIS — Z6830 Body mass index (BMI) 30.0-30.9, adult: Secondary | ICD-10-CM | POA: Diagnosis not present

## 2015-01-24 DIAGNOSIS — C61 Malignant neoplasm of prostate: Secondary | ICD-10-CM | POA: Diagnosis not present

## 2015-01-24 DIAGNOSIS — F39 Unspecified mood [affective] disorder: Secondary | ICD-10-CM | POA: Diagnosis not present

## 2015-01-24 DIAGNOSIS — F1021 Alcohol dependence, in remission: Secondary | ICD-10-CM | POA: Diagnosis not present

## 2015-01-24 DIAGNOSIS — J449 Chronic obstructive pulmonary disease, unspecified: Secondary | ICD-10-CM | POA: Diagnosis not present

## 2015-01-29 DIAGNOSIS — Z9889 Other specified postprocedural states: Secondary | ICD-10-CM | POA: Diagnosis not present

## 2015-01-29 DIAGNOSIS — T8189XA Other complications of procedures, not elsewhere classified, initial encounter: Secondary | ICD-10-CM | POA: Diagnosis not present

## 2015-01-31 DIAGNOSIS — T8132XD Disruption of internal operation (surgical) wound, not elsewhere classified, subsequent encounter: Secondary | ICD-10-CM | POA: Diagnosis not present

## 2015-01-31 DIAGNOSIS — L89622 Pressure ulcer of left heel, stage 2: Secondary | ICD-10-CM | POA: Diagnosis not present

## 2015-01-31 DIAGNOSIS — T8132XA Disruption of internal operation (surgical) wound, not elsewhere classified, initial encounter: Secondary | ICD-10-CM | POA: Diagnosis not present

## 2015-01-31 DIAGNOSIS — L97909 Non-pressure chronic ulcer of unspecified part of unspecified lower leg with unspecified severity: Secondary | ICD-10-CM | POA: Diagnosis not present

## 2015-02-01 DIAGNOSIS — J449 Chronic obstructive pulmonary disease, unspecified: Secondary | ICD-10-CM | POA: Diagnosis not present

## 2015-02-07 DIAGNOSIS — T8132XA Disruption of internal operation (surgical) wound, not elsewhere classified, initial encounter: Secondary | ICD-10-CM | POA: Diagnosis not present

## 2015-02-07 DIAGNOSIS — T8132XD Disruption of internal operation (surgical) wound, not elsewhere classified, subsequent encounter: Secondary | ICD-10-CM | POA: Diagnosis not present

## 2015-02-07 DIAGNOSIS — L89622 Pressure ulcer of left heel, stage 2: Secondary | ICD-10-CM | POA: Diagnosis not present

## 2015-02-14 DIAGNOSIS — T8131XA Disruption of external operation (surgical) wound, not elsewhere classified, initial encounter: Secondary | ICD-10-CM | POA: Diagnosis not present

## 2015-02-14 DIAGNOSIS — T8132XD Disruption of internal operation (surgical) wound, not elsewhere classified, subsequent encounter: Secondary | ICD-10-CM | POA: Diagnosis not present

## 2015-02-14 DIAGNOSIS — L89622 Pressure ulcer of left heel, stage 2: Secondary | ICD-10-CM | POA: Diagnosis not present

## 2015-02-14 DIAGNOSIS — T8132XA Disruption of internal operation (surgical) wound, not elsewhere classified, initial encounter: Secondary | ICD-10-CM | POA: Diagnosis not present

## 2015-02-15 DIAGNOSIS — Z9889 Other specified postprocedural states: Secondary | ICD-10-CM | POA: Diagnosis not present

## 2015-02-15 DIAGNOSIS — S91302A Unspecified open wound, left foot, initial encounter: Secondary | ICD-10-CM | POA: Diagnosis not present

## 2015-02-15 DIAGNOSIS — M216X2 Other acquired deformities of left foot: Secondary | ICD-10-CM | POA: Diagnosis not present

## 2015-02-15 DIAGNOSIS — S91309A Unspecified open wound, unspecified foot, initial encounter: Secondary | ICD-10-CM | POA: Insufficient documentation

## 2015-02-18 DIAGNOSIS — L97909 Non-pressure chronic ulcer of unspecified part of unspecified lower leg with unspecified severity: Secondary | ICD-10-CM | POA: Diagnosis not present

## 2015-02-20 DIAGNOSIS — M21372 Foot drop, left foot: Secondary | ICD-10-CM | POA: Diagnosis not present

## 2015-02-20 DIAGNOSIS — I10 Essential (primary) hypertension: Secondary | ICD-10-CM | POA: Diagnosis not present

## 2015-02-20 DIAGNOSIS — E119 Type 2 diabetes mellitus without complications: Secondary | ICD-10-CM | POA: Diagnosis not present

## 2015-02-20 DIAGNOSIS — C61 Malignant neoplasm of prostate: Secondary | ICD-10-CM | POA: Diagnosis not present

## 2015-02-22 DIAGNOSIS — S91302D Unspecified open wound, left foot, subsequent encounter: Secondary | ICD-10-CM | POA: Diagnosis not present

## 2015-03-03 DIAGNOSIS — J449 Chronic obstructive pulmonary disease, unspecified: Secondary | ICD-10-CM | POA: Diagnosis not present

## 2015-03-06 DIAGNOSIS — S91302A Unspecified open wound, left foot, initial encounter: Secondary | ICD-10-CM | POA: Diagnosis not present

## 2015-03-07 DIAGNOSIS — M4806 Spinal stenosis, lumbar region: Secondary | ICD-10-CM | POA: Diagnosis not present

## 2015-03-07 DIAGNOSIS — T8189XD Other complications of procedures, not elsewhere classified, subsequent encounter: Secondary | ICD-10-CM | POA: Diagnosis not present

## 2015-03-07 DIAGNOSIS — I10 Essential (primary) hypertension: Secondary | ICD-10-CM | POA: Diagnosis not present

## 2015-03-07 DIAGNOSIS — Z87891 Personal history of nicotine dependence: Secondary | ICD-10-CM | POA: Diagnosis not present

## 2015-03-07 DIAGNOSIS — J449 Chronic obstructive pulmonary disease, unspecified: Secondary | ICD-10-CM | POA: Diagnosis not present

## 2015-03-07 DIAGNOSIS — Z9181 History of falling: Secondary | ICD-10-CM | POA: Diagnosis not present

## 2015-03-07 DIAGNOSIS — E119 Type 2 diabetes mellitus without complications: Secondary | ICD-10-CM | POA: Diagnosis not present

## 2015-03-07 DIAGNOSIS — Z9981 Dependence on supplemental oxygen: Secondary | ICD-10-CM | POA: Diagnosis not present

## 2015-03-09 DIAGNOSIS — Z87891 Personal history of nicotine dependence: Secondary | ICD-10-CM | POA: Diagnosis not present

## 2015-03-09 DIAGNOSIS — Z9981 Dependence on supplemental oxygen: Secondary | ICD-10-CM | POA: Diagnosis not present

## 2015-03-09 DIAGNOSIS — E119 Type 2 diabetes mellitus without complications: Secondary | ICD-10-CM | POA: Diagnosis not present

## 2015-03-09 DIAGNOSIS — J449 Chronic obstructive pulmonary disease, unspecified: Secondary | ICD-10-CM | POA: Diagnosis not present

## 2015-03-09 DIAGNOSIS — Z9181 History of falling: Secondary | ICD-10-CM | POA: Diagnosis not present

## 2015-03-09 DIAGNOSIS — M4806 Spinal stenosis, lumbar region: Secondary | ICD-10-CM | POA: Diagnosis not present

## 2015-03-09 DIAGNOSIS — I10 Essential (primary) hypertension: Secondary | ICD-10-CM | POA: Diagnosis not present

## 2015-03-09 DIAGNOSIS — T8189XD Other complications of procedures, not elsewhere classified, subsequent encounter: Secondary | ICD-10-CM | POA: Diagnosis not present

## 2015-03-11 DIAGNOSIS — Z9981 Dependence on supplemental oxygen: Secondary | ICD-10-CM | POA: Diagnosis not present

## 2015-03-11 DIAGNOSIS — E119 Type 2 diabetes mellitus without complications: Secondary | ICD-10-CM | POA: Diagnosis not present

## 2015-03-11 DIAGNOSIS — M4806 Spinal stenosis, lumbar region: Secondary | ICD-10-CM | POA: Diagnosis not present

## 2015-03-11 DIAGNOSIS — Z9181 History of falling: Secondary | ICD-10-CM | POA: Diagnosis not present

## 2015-03-11 DIAGNOSIS — T8189XD Other complications of procedures, not elsewhere classified, subsequent encounter: Secondary | ICD-10-CM | POA: Diagnosis not present

## 2015-03-11 DIAGNOSIS — J449 Chronic obstructive pulmonary disease, unspecified: Secondary | ICD-10-CM | POA: Diagnosis not present

## 2015-03-11 DIAGNOSIS — Z87891 Personal history of nicotine dependence: Secondary | ICD-10-CM | POA: Diagnosis not present

## 2015-03-11 DIAGNOSIS — I10 Essential (primary) hypertension: Secondary | ICD-10-CM | POA: Diagnosis not present

## 2015-03-12 DIAGNOSIS — S91302D Unspecified open wound, left foot, subsequent encounter: Secondary | ICD-10-CM | POA: Diagnosis not present

## 2015-03-13 DIAGNOSIS — I10 Essential (primary) hypertension: Secondary | ICD-10-CM | POA: Diagnosis not present

## 2015-03-13 DIAGNOSIS — E119 Type 2 diabetes mellitus without complications: Secondary | ICD-10-CM | POA: Diagnosis not present

## 2015-03-13 DIAGNOSIS — Z9181 History of falling: Secondary | ICD-10-CM | POA: Diagnosis not present

## 2015-03-13 DIAGNOSIS — Z9981 Dependence on supplemental oxygen: Secondary | ICD-10-CM | POA: Diagnosis not present

## 2015-03-13 DIAGNOSIS — M4806 Spinal stenosis, lumbar region: Secondary | ICD-10-CM | POA: Diagnosis not present

## 2015-03-13 DIAGNOSIS — J449 Chronic obstructive pulmonary disease, unspecified: Secondary | ICD-10-CM | POA: Diagnosis not present

## 2015-03-13 DIAGNOSIS — Z87891 Personal history of nicotine dependence: Secondary | ICD-10-CM | POA: Diagnosis not present

## 2015-03-13 DIAGNOSIS — T8189XD Other complications of procedures, not elsewhere classified, subsequent encounter: Secondary | ICD-10-CM | POA: Diagnosis not present

## 2015-03-15 DIAGNOSIS — J449 Chronic obstructive pulmonary disease, unspecified: Secondary | ICD-10-CM | POA: Diagnosis not present

## 2015-03-15 DIAGNOSIS — I10 Essential (primary) hypertension: Secondary | ICD-10-CM | POA: Diagnosis not present

## 2015-03-15 DIAGNOSIS — E119 Type 2 diabetes mellitus without complications: Secondary | ICD-10-CM | POA: Diagnosis not present

## 2015-03-15 DIAGNOSIS — Z9981 Dependence on supplemental oxygen: Secondary | ICD-10-CM | POA: Diagnosis not present

## 2015-03-15 DIAGNOSIS — M4806 Spinal stenosis, lumbar region: Secondary | ICD-10-CM | POA: Diagnosis not present

## 2015-03-15 DIAGNOSIS — Z9181 History of falling: Secondary | ICD-10-CM | POA: Diagnosis not present

## 2015-03-15 DIAGNOSIS — T8189XD Other complications of procedures, not elsewhere classified, subsequent encounter: Secondary | ICD-10-CM | POA: Diagnosis not present

## 2015-03-15 DIAGNOSIS — Z87891 Personal history of nicotine dependence: Secondary | ICD-10-CM | POA: Diagnosis not present

## 2015-03-18 DIAGNOSIS — I10 Essential (primary) hypertension: Secondary | ICD-10-CM | POA: Diagnosis not present

## 2015-03-18 DIAGNOSIS — T8189XD Other complications of procedures, not elsewhere classified, subsequent encounter: Secondary | ICD-10-CM | POA: Diagnosis not present

## 2015-03-18 DIAGNOSIS — Z9981 Dependence on supplemental oxygen: Secondary | ICD-10-CM | POA: Diagnosis not present

## 2015-03-18 DIAGNOSIS — Z87891 Personal history of nicotine dependence: Secondary | ICD-10-CM | POA: Diagnosis not present

## 2015-03-18 DIAGNOSIS — Z9181 History of falling: Secondary | ICD-10-CM | POA: Diagnosis not present

## 2015-03-18 DIAGNOSIS — E119 Type 2 diabetes mellitus without complications: Secondary | ICD-10-CM | POA: Diagnosis not present

## 2015-03-18 DIAGNOSIS — M4806 Spinal stenosis, lumbar region: Secondary | ICD-10-CM | POA: Diagnosis not present

## 2015-03-18 DIAGNOSIS — J449 Chronic obstructive pulmonary disease, unspecified: Secondary | ICD-10-CM | POA: Diagnosis not present

## 2015-03-19 DIAGNOSIS — S91302A Unspecified open wound, left foot, initial encounter: Secondary | ICD-10-CM | POA: Diagnosis not present

## 2015-03-20 DIAGNOSIS — Z9181 History of falling: Secondary | ICD-10-CM | POA: Diagnosis not present

## 2015-03-20 DIAGNOSIS — T8189XD Other complications of procedures, not elsewhere classified, subsequent encounter: Secondary | ICD-10-CM | POA: Diagnosis not present

## 2015-03-20 DIAGNOSIS — Z9981 Dependence on supplemental oxygen: Secondary | ICD-10-CM | POA: Diagnosis not present

## 2015-03-20 DIAGNOSIS — J449 Chronic obstructive pulmonary disease, unspecified: Secondary | ICD-10-CM | POA: Diagnosis not present

## 2015-03-20 DIAGNOSIS — Z87891 Personal history of nicotine dependence: Secondary | ICD-10-CM | POA: Diagnosis not present

## 2015-03-20 DIAGNOSIS — M4806 Spinal stenosis, lumbar region: Secondary | ICD-10-CM | POA: Diagnosis not present

## 2015-03-20 DIAGNOSIS — E119 Type 2 diabetes mellitus without complications: Secondary | ICD-10-CM | POA: Diagnosis not present

## 2015-03-20 DIAGNOSIS — I10 Essential (primary) hypertension: Secondary | ICD-10-CM | POA: Diagnosis not present

## 2015-03-22 DIAGNOSIS — M4806 Spinal stenosis, lumbar region: Secondary | ICD-10-CM | POA: Diagnosis not present

## 2015-03-22 DIAGNOSIS — J449 Chronic obstructive pulmonary disease, unspecified: Secondary | ICD-10-CM | POA: Diagnosis not present

## 2015-03-22 DIAGNOSIS — Z9181 History of falling: Secondary | ICD-10-CM | POA: Diagnosis not present

## 2015-03-22 DIAGNOSIS — Z87891 Personal history of nicotine dependence: Secondary | ICD-10-CM | POA: Diagnosis not present

## 2015-03-22 DIAGNOSIS — Z9981 Dependence on supplemental oxygen: Secondary | ICD-10-CM | POA: Diagnosis not present

## 2015-03-22 DIAGNOSIS — E119 Type 2 diabetes mellitus without complications: Secondary | ICD-10-CM | POA: Diagnosis not present

## 2015-03-22 DIAGNOSIS — I10 Essential (primary) hypertension: Secondary | ICD-10-CM | POA: Diagnosis not present

## 2015-03-22 DIAGNOSIS — T8189XD Other complications of procedures, not elsewhere classified, subsequent encounter: Secondary | ICD-10-CM | POA: Diagnosis not present

## 2015-03-23 DIAGNOSIS — I10 Essential (primary) hypertension: Secondary | ICD-10-CM | POA: Diagnosis not present

## 2015-03-23 DIAGNOSIS — C61 Malignant neoplasm of prostate: Secondary | ICD-10-CM | POA: Diagnosis not present

## 2015-03-23 DIAGNOSIS — E119 Type 2 diabetes mellitus without complications: Secondary | ICD-10-CM | POA: Diagnosis not present

## 2015-03-23 DIAGNOSIS — M21372 Foot drop, left foot: Secondary | ICD-10-CM | POA: Diagnosis not present

## 2015-03-26 DIAGNOSIS — S91302D Unspecified open wound, left foot, subsequent encounter: Secondary | ICD-10-CM | POA: Diagnosis not present

## 2015-03-27 DIAGNOSIS — Z9181 History of falling: Secondary | ICD-10-CM | POA: Diagnosis not present

## 2015-03-27 DIAGNOSIS — E119 Type 2 diabetes mellitus without complications: Secondary | ICD-10-CM | POA: Diagnosis not present

## 2015-03-27 DIAGNOSIS — M4806 Spinal stenosis, lumbar region: Secondary | ICD-10-CM | POA: Diagnosis not present

## 2015-03-27 DIAGNOSIS — J449 Chronic obstructive pulmonary disease, unspecified: Secondary | ICD-10-CM | POA: Diagnosis not present

## 2015-03-27 DIAGNOSIS — Z87891 Personal history of nicotine dependence: Secondary | ICD-10-CM | POA: Diagnosis not present

## 2015-03-27 DIAGNOSIS — I10 Essential (primary) hypertension: Secondary | ICD-10-CM | POA: Diagnosis not present

## 2015-03-27 DIAGNOSIS — T8189XD Other complications of procedures, not elsewhere classified, subsequent encounter: Secondary | ICD-10-CM | POA: Diagnosis not present

## 2015-03-27 DIAGNOSIS — Z9981 Dependence on supplemental oxygen: Secondary | ICD-10-CM | POA: Diagnosis not present

## 2015-03-29 DIAGNOSIS — Z9981 Dependence on supplemental oxygen: Secondary | ICD-10-CM | POA: Diagnosis not present

## 2015-03-29 DIAGNOSIS — Z87891 Personal history of nicotine dependence: Secondary | ICD-10-CM | POA: Diagnosis not present

## 2015-03-29 DIAGNOSIS — J449 Chronic obstructive pulmonary disease, unspecified: Secondary | ICD-10-CM | POA: Diagnosis not present

## 2015-03-29 DIAGNOSIS — S91302A Unspecified open wound, left foot, initial encounter: Secondary | ICD-10-CM | POA: Diagnosis not present

## 2015-03-29 DIAGNOSIS — Z9181 History of falling: Secondary | ICD-10-CM | POA: Diagnosis not present

## 2015-03-29 DIAGNOSIS — T8189XD Other complications of procedures, not elsewhere classified, subsequent encounter: Secondary | ICD-10-CM | POA: Diagnosis not present

## 2015-03-29 DIAGNOSIS — I10 Essential (primary) hypertension: Secondary | ICD-10-CM | POA: Diagnosis not present

## 2015-03-29 DIAGNOSIS — M4806 Spinal stenosis, lumbar region: Secondary | ICD-10-CM | POA: Diagnosis not present

## 2015-03-29 DIAGNOSIS — E119 Type 2 diabetes mellitus without complications: Secondary | ICD-10-CM | POA: Diagnosis not present

## 2015-04-01 DIAGNOSIS — J449 Chronic obstructive pulmonary disease, unspecified: Secondary | ICD-10-CM | POA: Diagnosis not present

## 2015-04-01 DIAGNOSIS — M4806 Spinal stenosis, lumbar region: Secondary | ICD-10-CM | POA: Diagnosis not present

## 2015-04-01 DIAGNOSIS — I10 Essential (primary) hypertension: Secondary | ICD-10-CM | POA: Diagnosis not present

## 2015-04-01 DIAGNOSIS — T8189XD Other complications of procedures, not elsewhere classified, subsequent encounter: Secondary | ICD-10-CM | POA: Diagnosis not present

## 2015-04-01 DIAGNOSIS — Z9981 Dependence on supplemental oxygen: Secondary | ICD-10-CM | POA: Diagnosis not present

## 2015-04-01 DIAGNOSIS — E119 Type 2 diabetes mellitus without complications: Secondary | ICD-10-CM | POA: Diagnosis not present

## 2015-04-01 DIAGNOSIS — Z87891 Personal history of nicotine dependence: Secondary | ICD-10-CM | POA: Diagnosis not present

## 2015-04-01 DIAGNOSIS — Z9181 History of falling: Secondary | ICD-10-CM | POA: Diagnosis not present

## 2015-04-02 DIAGNOSIS — E119 Type 2 diabetes mellitus without complications: Secondary | ICD-10-CM | POA: Diagnosis not present

## 2015-04-02 DIAGNOSIS — M216X2 Other acquired deformities of left foot: Secondary | ICD-10-CM | POA: Diagnosis not present

## 2015-04-02 DIAGNOSIS — Z9889 Other specified postprocedural states: Secondary | ICD-10-CM | POA: Diagnosis not present

## 2015-04-02 DIAGNOSIS — Z885 Allergy status to narcotic agent status: Secondary | ICD-10-CM | POA: Diagnosis not present

## 2015-04-02 DIAGNOSIS — Z7982 Long term (current) use of aspirin: Secondary | ICD-10-CM | POA: Diagnosis not present

## 2015-04-02 DIAGNOSIS — Z79899 Other long term (current) drug therapy: Secondary | ICD-10-CM | POA: Diagnosis not present

## 2015-04-02 DIAGNOSIS — Z87891 Personal history of nicotine dependence: Secondary | ICD-10-CM | POA: Diagnosis not present

## 2015-04-02 DIAGNOSIS — I10 Essential (primary) hypertension: Secondary | ICD-10-CM | POA: Diagnosis not present

## 2015-04-03 DIAGNOSIS — Z9181 History of falling: Secondary | ICD-10-CM | POA: Diagnosis not present

## 2015-04-03 DIAGNOSIS — J449 Chronic obstructive pulmonary disease, unspecified: Secondary | ICD-10-CM | POA: Diagnosis not present

## 2015-04-03 DIAGNOSIS — T8189XD Other complications of procedures, not elsewhere classified, subsequent encounter: Secondary | ICD-10-CM | POA: Diagnosis not present

## 2015-04-03 DIAGNOSIS — Z9981 Dependence on supplemental oxygen: Secondary | ICD-10-CM | POA: Diagnosis not present

## 2015-04-03 DIAGNOSIS — Z87891 Personal history of nicotine dependence: Secondary | ICD-10-CM | POA: Diagnosis not present

## 2015-04-03 DIAGNOSIS — I10 Essential (primary) hypertension: Secondary | ICD-10-CM | POA: Diagnosis not present

## 2015-04-03 DIAGNOSIS — E119 Type 2 diabetes mellitus without complications: Secondary | ICD-10-CM | POA: Diagnosis not present

## 2015-04-03 DIAGNOSIS — M4806 Spinal stenosis, lumbar region: Secondary | ICD-10-CM | POA: Diagnosis not present

## 2015-04-05 DIAGNOSIS — J449 Chronic obstructive pulmonary disease, unspecified: Secondary | ICD-10-CM | POA: Diagnosis not present

## 2015-04-05 DIAGNOSIS — T8189XD Other complications of procedures, not elsewhere classified, subsequent encounter: Secondary | ICD-10-CM | POA: Diagnosis not present

## 2015-04-05 DIAGNOSIS — M4806 Spinal stenosis, lumbar region: Secondary | ICD-10-CM | POA: Diagnosis not present

## 2015-04-05 DIAGNOSIS — S91302A Unspecified open wound, left foot, initial encounter: Secondary | ICD-10-CM | POA: Diagnosis not present

## 2015-04-05 DIAGNOSIS — E119 Type 2 diabetes mellitus without complications: Secondary | ICD-10-CM | POA: Diagnosis not present

## 2015-04-05 DIAGNOSIS — Z9181 History of falling: Secondary | ICD-10-CM | POA: Diagnosis not present

## 2015-04-05 DIAGNOSIS — Z87891 Personal history of nicotine dependence: Secondary | ICD-10-CM | POA: Diagnosis not present

## 2015-04-05 DIAGNOSIS — I10 Essential (primary) hypertension: Secondary | ICD-10-CM | POA: Diagnosis not present

## 2015-04-05 DIAGNOSIS — Z9981 Dependence on supplemental oxygen: Secondary | ICD-10-CM | POA: Diagnosis not present

## 2015-04-08 DIAGNOSIS — Z9981 Dependence on supplemental oxygen: Secondary | ICD-10-CM | POA: Diagnosis not present

## 2015-04-08 DIAGNOSIS — M4806 Spinal stenosis, lumbar region: Secondary | ICD-10-CM | POA: Diagnosis not present

## 2015-04-08 DIAGNOSIS — T8189XD Other complications of procedures, not elsewhere classified, subsequent encounter: Secondary | ICD-10-CM | POA: Diagnosis not present

## 2015-04-08 DIAGNOSIS — E119 Type 2 diabetes mellitus without complications: Secondary | ICD-10-CM | POA: Diagnosis not present

## 2015-04-08 DIAGNOSIS — Z9181 History of falling: Secondary | ICD-10-CM | POA: Diagnosis not present

## 2015-04-08 DIAGNOSIS — Z87891 Personal history of nicotine dependence: Secondary | ICD-10-CM | POA: Diagnosis not present

## 2015-04-08 DIAGNOSIS — I10 Essential (primary) hypertension: Secondary | ICD-10-CM | POA: Diagnosis not present

## 2015-04-08 DIAGNOSIS — J449 Chronic obstructive pulmonary disease, unspecified: Secondary | ICD-10-CM | POA: Diagnosis not present

## 2015-04-10 DIAGNOSIS — Z87891 Personal history of nicotine dependence: Secondary | ICD-10-CM | POA: Diagnosis not present

## 2015-04-10 DIAGNOSIS — J449 Chronic obstructive pulmonary disease, unspecified: Secondary | ICD-10-CM | POA: Diagnosis not present

## 2015-04-10 DIAGNOSIS — Z9981 Dependence on supplemental oxygen: Secondary | ICD-10-CM | POA: Diagnosis not present

## 2015-04-10 DIAGNOSIS — M4806 Spinal stenosis, lumbar region: Secondary | ICD-10-CM | POA: Diagnosis not present

## 2015-04-10 DIAGNOSIS — E119 Type 2 diabetes mellitus without complications: Secondary | ICD-10-CM | POA: Diagnosis not present

## 2015-04-10 DIAGNOSIS — I10 Essential (primary) hypertension: Secondary | ICD-10-CM | POA: Diagnosis not present

## 2015-04-10 DIAGNOSIS — Z9181 History of falling: Secondary | ICD-10-CM | POA: Diagnosis not present

## 2015-04-10 DIAGNOSIS — T8189XD Other complications of procedures, not elsewhere classified, subsequent encounter: Secondary | ICD-10-CM | POA: Diagnosis not present

## 2015-04-12 DIAGNOSIS — Z9981 Dependence on supplemental oxygen: Secondary | ICD-10-CM | POA: Diagnosis not present

## 2015-04-12 DIAGNOSIS — M4806 Spinal stenosis, lumbar region: Secondary | ICD-10-CM | POA: Diagnosis not present

## 2015-04-12 DIAGNOSIS — J449 Chronic obstructive pulmonary disease, unspecified: Secondary | ICD-10-CM | POA: Diagnosis not present

## 2015-04-12 DIAGNOSIS — Z87891 Personal history of nicotine dependence: Secondary | ICD-10-CM | POA: Diagnosis not present

## 2015-04-12 DIAGNOSIS — Z9181 History of falling: Secondary | ICD-10-CM | POA: Diagnosis not present

## 2015-04-12 DIAGNOSIS — I10 Essential (primary) hypertension: Secondary | ICD-10-CM | POA: Diagnosis not present

## 2015-04-12 DIAGNOSIS — T8189XD Other complications of procedures, not elsewhere classified, subsequent encounter: Secondary | ICD-10-CM | POA: Diagnosis not present

## 2015-04-12 DIAGNOSIS — E119 Type 2 diabetes mellitus without complications: Secondary | ICD-10-CM | POA: Diagnosis not present

## 2015-04-15 DIAGNOSIS — Z87891 Personal history of nicotine dependence: Secondary | ICD-10-CM | POA: Diagnosis not present

## 2015-04-15 DIAGNOSIS — Z9181 History of falling: Secondary | ICD-10-CM | POA: Diagnosis not present

## 2015-04-15 DIAGNOSIS — J449 Chronic obstructive pulmonary disease, unspecified: Secondary | ICD-10-CM | POA: Diagnosis not present

## 2015-04-15 DIAGNOSIS — M4806 Spinal stenosis, lumbar region: Secondary | ICD-10-CM | POA: Diagnosis not present

## 2015-04-15 DIAGNOSIS — S91302A Unspecified open wound, left foot, initial encounter: Secondary | ICD-10-CM | POA: Diagnosis not present

## 2015-04-15 DIAGNOSIS — E119 Type 2 diabetes mellitus without complications: Secondary | ICD-10-CM | POA: Diagnosis not present

## 2015-04-15 DIAGNOSIS — I10 Essential (primary) hypertension: Secondary | ICD-10-CM | POA: Diagnosis not present

## 2015-04-15 DIAGNOSIS — T8189XD Other complications of procedures, not elsewhere classified, subsequent encounter: Secondary | ICD-10-CM | POA: Diagnosis not present

## 2015-04-15 DIAGNOSIS — Z9981 Dependence on supplemental oxygen: Secondary | ICD-10-CM | POA: Diagnosis not present

## 2015-04-17 DIAGNOSIS — Z9181 History of falling: Secondary | ICD-10-CM | POA: Diagnosis not present

## 2015-04-17 DIAGNOSIS — E119 Type 2 diabetes mellitus without complications: Secondary | ICD-10-CM | POA: Diagnosis not present

## 2015-04-17 DIAGNOSIS — J449 Chronic obstructive pulmonary disease, unspecified: Secondary | ICD-10-CM | POA: Diagnosis not present

## 2015-04-17 DIAGNOSIS — Z9981 Dependence on supplemental oxygen: Secondary | ICD-10-CM | POA: Diagnosis not present

## 2015-04-17 DIAGNOSIS — I10 Essential (primary) hypertension: Secondary | ICD-10-CM | POA: Diagnosis not present

## 2015-04-17 DIAGNOSIS — M4806 Spinal stenosis, lumbar region: Secondary | ICD-10-CM | POA: Diagnosis not present

## 2015-04-17 DIAGNOSIS — T8189XD Other complications of procedures, not elsewhere classified, subsequent encounter: Secondary | ICD-10-CM | POA: Diagnosis not present

## 2015-04-17 DIAGNOSIS — Z87891 Personal history of nicotine dependence: Secondary | ICD-10-CM | POA: Diagnosis not present

## 2015-04-19 DIAGNOSIS — Z87891 Personal history of nicotine dependence: Secondary | ICD-10-CM | POA: Diagnosis not present

## 2015-04-19 DIAGNOSIS — M4806 Spinal stenosis, lumbar region: Secondary | ICD-10-CM | POA: Diagnosis not present

## 2015-04-19 DIAGNOSIS — I10 Essential (primary) hypertension: Secondary | ICD-10-CM | POA: Diagnosis not present

## 2015-04-19 DIAGNOSIS — J449 Chronic obstructive pulmonary disease, unspecified: Secondary | ICD-10-CM | POA: Diagnosis not present

## 2015-04-19 DIAGNOSIS — E119 Type 2 diabetes mellitus without complications: Secondary | ICD-10-CM | POA: Diagnosis not present

## 2015-04-19 DIAGNOSIS — Z9981 Dependence on supplemental oxygen: Secondary | ICD-10-CM | POA: Diagnosis not present

## 2015-04-19 DIAGNOSIS — T8189XD Other complications of procedures, not elsewhere classified, subsequent encounter: Secondary | ICD-10-CM | POA: Diagnosis not present

## 2015-04-19 DIAGNOSIS — Z9181 History of falling: Secondary | ICD-10-CM | POA: Diagnosis not present

## 2015-04-22 DIAGNOSIS — Z87891 Personal history of nicotine dependence: Secondary | ICD-10-CM | POA: Diagnosis not present

## 2015-04-22 DIAGNOSIS — C61 Malignant neoplasm of prostate: Secondary | ICD-10-CM | POA: Diagnosis not present

## 2015-04-22 DIAGNOSIS — J449 Chronic obstructive pulmonary disease, unspecified: Secondary | ICD-10-CM | POA: Diagnosis not present

## 2015-04-22 DIAGNOSIS — M21372 Foot drop, left foot: Secondary | ICD-10-CM | POA: Diagnosis not present

## 2015-04-22 DIAGNOSIS — E119 Type 2 diabetes mellitus without complications: Secondary | ICD-10-CM | POA: Diagnosis not present

## 2015-04-22 DIAGNOSIS — M4806 Spinal stenosis, lumbar region: Secondary | ICD-10-CM | POA: Diagnosis not present

## 2015-04-22 DIAGNOSIS — T8189XD Other complications of procedures, not elsewhere classified, subsequent encounter: Secondary | ICD-10-CM | POA: Diagnosis not present

## 2015-04-22 DIAGNOSIS — I10 Essential (primary) hypertension: Secondary | ICD-10-CM | POA: Diagnosis not present

## 2015-04-22 DIAGNOSIS — Z9181 History of falling: Secondary | ICD-10-CM | POA: Diagnosis not present

## 2015-04-22 DIAGNOSIS — Z9981 Dependence on supplemental oxygen: Secondary | ICD-10-CM | POA: Diagnosis not present

## 2015-04-22 DIAGNOSIS — S91302A Unspecified open wound, left foot, initial encounter: Secondary | ICD-10-CM | POA: Diagnosis not present

## 2015-04-24 DIAGNOSIS — I10 Essential (primary) hypertension: Secondary | ICD-10-CM | POA: Diagnosis not present

## 2015-04-24 DIAGNOSIS — E119 Type 2 diabetes mellitus without complications: Secondary | ICD-10-CM | POA: Diagnosis not present

## 2015-04-24 DIAGNOSIS — Z9181 History of falling: Secondary | ICD-10-CM | POA: Diagnosis not present

## 2015-04-24 DIAGNOSIS — J449 Chronic obstructive pulmonary disease, unspecified: Secondary | ICD-10-CM | POA: Diagnosis not present

## 2015-04-24 DIAGNOSIS — Z9981 Dependence on supplemental oxygen: Secondary | ICD-10-CM | POA: Diagnosis not present

## 2015-04-24 DIAGNOSIS — M4806 Spinal stenosis, lumbar region: Secondary | ICD-10-CM | POA: Diagnosis not present

## 2015-04-24 DIAGNOSIS — T8189XD Other complications of procedures, not elsewhere classified, subsequent encounter: Secondary | ICD-10-CM | POA: Diagnosis not present

## 2015-04-24 DIAGNOSIS — Z87891 Personal history of nicotine dependence: Secondary | ICD-10-CM | POA: Diagnosis not present

## 2015-04-26 DIAGNOSIS — Z87891 Personal history of nicotine dependence: Secondary | ICD-10-CM | POA: Diagnosis not present

## 2015-04-26 DIAGNOSIS — J449 Chronic obstructive pulmonary disease, unspecified: Secondary | ICD-10-CM | POA: Diagnosis not present

## 2015-04-26 DIAGNOSIS — M4806 Spinal stenosis, lumbar region: Secondary | ICD-10-CM | POA: Diagnosis not present

## 2015-04-26 DIAGNOSIS — Z9181 History of falling: Secondary | ICD-10-CM | POA: Diagnosis not present

## 2015-04-26 DIAGNOSIS — E119 Type 2 diabetes mellitus without complications: Secondary | ICD-10-CM | POA: Diagnosis not present

## 2015-04-26 DIAGNOSIS — I10 Essential (primary) hypertension: Secondary | ICD-10-CM | POA: Diagnosis not present

## 2015-04-26 DIAGNOSIS — Z9981 Dependence on supplemental oxygen: Secondary | ICD-10-CM | POA: Diagnosis not present

## 2015-04-26 DIAGNOSIS — T8189XD Other complications of procedures, not elsewhere classified, subsequent encounter: Secondary | ICD-10-CM | POA: Diagnosis not present

## 2015-04-29 DIAGNOSIS — Z87891 Personal history of nicotine dependence: Secondary | ICD-10-CM | POA: Diagnosis not present

## 2015-04-29 DIAGNOSIS — J449 Chronic obstructive pulmonary disease, unspecified: Secondary | ICD-10-CM | POA: Diagnosis not present

## 2015-04-29 DIAGNOSIS — I10 Essential (primary) hypertension: Secondary | ICD-10-CM | POA: Diagnosis not present

## 2015-04-29 DIAGNOSIS — T8189XD Other complications of procedures, not elsewhere classified, subsequent encounter: Secondary | ICD-10-CM | POA: Diagnosis not present

## 2015-04-29 DIAGNOSIS — Z9181 History of falling: Secondary | ICD-10-CM | POA: Diagnosis not present

## 2015-04-29 DIAGNOSIS — M4806 Spinal stenosis, lumbar region: Secondary | ICD-10-CM | POA: Diagnosis not present

## 2015-04-29 DIAGNOSIS — E119 Type 2 diabetes mellitus without complications: Secondary | ICD-10-CM | POA: Diagnosis not present

## 2015-04-29 DIAGNOSIS — Z9981 Dependence on supplemental oxygen: Secondary | ICD-10-CM | POA: Diagnosis not present

## 2015-05-01 DIAGNOSIS — T8189XD Other complications of procedures, not elsewhere classified, subsequent encounter: Secondary | ICD-10-CM | POA: Diagnosis not present

## 2015-05-01 DIAGNOSIS — Z87891 Personal history of nicotine dependence: Secondary | ICD-10-CM | POA: Diagnosis not present

## 2015-05-01 DIAGNOSIS — J449 Chronic obstructive pulmonary disease, unspecified: Secondary | ICD-10-CM | POA: Diagnosis not present

## 2015-05-01 DIAGNOSIS — E119 Type 2 diabetes mellitus without complications: Secondary | ICD-10-CM | POA: Diagnosis not present

## 2015-05-01 DIAGNOSIS — I10 Essential (primary) hypertension: Secondary | ICD-10-CM | POA: Diagnosis not present

## 2015-05-01 DIAGNOSIS — Z9181 History of falling: Secondary | ICD-10-CM | POA: Diagnosis not present

## 2015-05-01 DIAGNOSIS — M4806 Spinal stenosis, lumbar region: Secondary | ICD-10-CM | POA: Diagnosis not present

## 2015-05-01 DIAGNOSIS — Z9981 Dependence on supplemental oxygen: Secondary | ICD-10-CM | POA: Diagnosis not present

## 2015-05-03 DIAGNOSIS — S91302D Unspecified open wound, left foot, subsequent encounter: Secondary | ICD-10-CM | POA: Diagnosis not present

## 2015-05-03 DIAGNOSIS — J449 Chronic obstructive pulmonary disease, unspecified: Secondary | ICD-10-CM | POA: Diagnosis not present

## 2015-05-06 DIAGNOSIS — E119 Type 2 diabetes mellitus without complications: Secondary | ICD-10-CM | POA: Diagnosis not present

## 2015-05-06 DIAGNOSIS — Z87891 Personal history of nicotine dependence: Secondary | ICD-10-CM | POA: Diagnosis not present

## 2015-05-06 DIAGNOSIS — I2510 Atherosclerotic heart disease of native coronary artery without angina pectoris: Secondary | ICD-10-CM | POA: Diagnosis not present

## 2015-05-06 DIAGNOSIS — Z9181 History of falling: Secondary | ICD-10-CM | POA: Diagnosis not present

## 2015-05-06 DIAGNOSIS — J449 Chronic obstructive pulmonary disease, unspecified: Secondary | ICD-10-CM | POA: Diagnosis not present

## 2015-05-06 DIAGNOSIS — M4806 Spinal stenosis, lumbar region: Secondary | ICD-10-CM | POA: Diagnosis not present

## 2015-05-06 DIAGNOSIS — Z9981 Dependence on supplemental oxygen: Secondary | ICD-10-CM | POA: Diagnosis not present

## 2015-05-06 DIAGNOSIS — T8189XD Other complications of procedures, not elsewhere classified, subsequent encounter: Secondary | ICD-10-CM | POA: Diagnosis not present

## 2015-05-06 DIAGNOSIS — I10 Essential (primary) hypertension: Secondary | ICD-10-CM | POA: Diagnosis not present

## 2015-05-06 DIAGNOSIS — S91302A Unspecified open wound, left foot, initial encounter: Secondary | ICD-10-CM | POA: Diagnosis not present

## 2015-05-08 DIAGNOSIS — J449 Chronic obstructive pulmonary disease, unspecified: Secondary | ICD-10-CM | POA: Diagnosis not present

## 2015-05-08 DIAGNOSIS — Z9981 Dependence on supplemental oxygen: Secondary | ICD-10-CM | POA: Diagnosis not present

## 2015-05-08 DIAGNOSIS — Z9181 History of falling: Secondary | ICD-10-CM | POA: Diagnosis not present

## 2015-05-08 DIAGNOSIS — I10 Essential (primary) hypertension: Secondary | ICD-10-CM | POA: Diagnosis not present

## 2015-05-08 DIAGNOSIS — M4806 Spinal stenosis, lumbar region: Secondary | ICD-10-CM | POA: Diagnosis not present

## 2015-05-08 DIAGNOSIS — E119 Type 2 diabetes mellitus without complications: Secondary | ICD-10-CM | POA: Diagnosis not present

## 2015-05-08 DIAGNOSIS — Z87891 Personal history of nicotine dependence: Secondary | ICD-10-CM | POA: Diagnosis not present

## 2015-05-08 DIAGNOSIS — T8189XD Other complications of procedures, not elsewhere classified, subsequent encounter: Secondary | ICD-10-CM | POA: Diagnosis not present

## 2015-05-08 DIAGNOSIS — I2510 Atherosclerotic heart disease of native coronary artery without angina pectoris: Secondary | ICD-10-CM | POA: Diagnosis not present

## 2015-05-10 DIAGNOSIS — I10 Essential (primary) hypertension: Secondary | ICD-10-CM | POA: Diagnosis not present

## 2015-05-10 DIAGNOSIS — I2510 Atherosclerotic heart disease of native coronary artery without angina pectoris: Secondary | ICD-10-CM | POA: Diagnosis not present

## 2015-05-10 DIAGNOSIS — M4806 Spinal stenosis, lumbar region: Secondary | ICD-10-CM | POA: Diagnosis not present

## 2015-05-10 DIAGNOSIS — Z9981 Dependence on supplemental oxygen: Secondary | ICD-10-CM | POA: Diagnosis not present

## 2015-05-10 DIAGNOSIS — C61 Malignant neoplasm of prostate: Secondary | ICD-10-CM | POA: Diagnosis not present

## 2015-05-10 DIAGNOSIS — T8189XD Other complications of procedures, not elsewhere classified, subsequent encounter: Secondary | ICD-10-CM | POA: Diagnosis not present

## 2015-05-10 DIAGNOSIS — J449 Chronic obstructive pulmonary disease, unspecified: Secondary | ICD-10-CM | POA: Diagnosis not present

## 2015-05-10 DIAGNOSIS — Z87891 Personal history of nicotine dependence: Secondary | ICD-10-CM | POA: Diagnosis not present

## 2015-05-10 DIAGNOSIS — E119 Type 2 diabetes mellitus without complications: Secondary | ICD-10-CM | POA: Diagnosis not present

## 2015-05-10 DIAGNOSIS — Z9181 History of falling: Secondary | ICD-10-CM | POA: Diagnosis not present

## 2015-05-13 DIAGNOSIS — J449 Chronic obstructive pulmonary disease, unspecified: Secondary | ICD-10-CM | POA: Diagnosis not present

## 2015-05-13 DIAGNOSIS — T8189XD Other complications of procedures, not elsewhere classified, subsequent encounter: Secondary | ICD-10-CM | POA: Diagnosis not present

## 2015-05-13 DIAGNOSIS — Z9981 Dependence on supplemental oxygen: Secondary | ICD-10-CM | POA: Diagnosis not present

## 2015-05-13 DIAGNOSIS — I2510 Atherosclerotic heart disease of native coronary artery without angina pectoris: Secondary | ICD-10-CM | POA: Diagnosis not present

## 2015-05-13 DIAGNOSIS — I10 Essential (primary) hypertension: Secondary | ICD-10-CM | POA: Diagnosis not present

## 2015-05-13 DIAGNOSIS — Z87891 Personal history of nicotine dependence: Secondary | ICD-10-CM | POA: Diagnosis not present

## 2015-05-13 DIAGNOSIS — Z9181 History of falling: Secondary | ICD-10-CM | POA: Diagnosis not present

## 2015-05-13 DIAGNOSIS — E119 Type 2 diabetes mellitus without complications: Secondary | ICD-10-CM | POA: Diagnosis not present

## 2015-05-13 DIAGNOSIS — M4806 Spinal stenosis, lumbar region: Secondary | ICD-10-CM | POA: Diagnosis not present

## 2015-05-15 DIAGNOSIS — M4806 Spinal stenosis, lumbar region: Secondary | ICD-10-CM | POA: Diagnosis not present

## 2015-05-15 DIAGNOSIS — Z9981 Dependence on supplemental oxygen: Secondary | ICD-10-CM | POA: Diagnosis not present

## 2015-05-15 DIAGNOSIS — T8189XD Other complications of procedures, not elsewhere classified, subsequent encounter: Secondary | ICD-10-CM | POA: Diagnosis not present

## 2015-05-15 DIAGNOSIS — I10 Essential (primary) hypertension: Secondary | ICD-10-CM | POA: Diagnosis not present

## 2015-05-15 DIAGNOSIS — Z9181 History of falling: Secondary | ICD-10-CM | POA: Diagnosis not present

## 2015-05-15 DIAGNOSIS — Z87891 Personal history of nicotine dependence: Secondary | ICD-10-CM | POA: Diagnosis not present

## 2015-05-15 DIAGNOSIS — J449 Chronic obstructive pulmonary disease, unspecified: Secondary | ICD-10-CM | POA: Diagnosis not present

## 2015-05-15 DIAGNOSIS — E119 Type 2 diabetes mellitus without complications: Secondary | ICD-10-CM | POA: Diagnosis not present

## 2015-05-15 DIAGNOSIS — I2510 Atherosclerotic heart disease of native coronary artery without angina pectoris: Secondary | ICD-10-CM | POA: Diagnosis not present

## 2015-05-16 DIAGNOSIS — S91302A Unspecified open wound, left foot, initial encounter: Secondary | ICD-10-CM | POA: Diagnosis not present

## 2015-05-17 DIAGNOSIS — S91302D Unspecified open wound, left foot, subsequent encounter: Secondary | ICD-10-CM | POA: Diagnosis not present

## 2015-05-18 DIAGNOSIS — E119 Type 2 diabetes mellitus without complications: Secondary | ICD-10-CM | POA: Diagnosis not present

## 2015-05-18 DIAGNOSIS — T8189XD Other complications of procedures, not elsewhere classified, subsequent encounter: Secondary | ICD-10-CM | POA: Diagnosis not present

## 2015-05-18 DIAGNOSIS — I10 Essential (primary) hypertension: Secondary | ICD-10-CM | POA: Diagnosis not present

## 2015-05-18 DIAGNOSIS — I2510 Atherosclerotic heart disease of native coronary artery without angina pectoris: Secondary | ICD-10-CM | POA: Diagnosis not present

## 2015-05-18 DIAGNOSIS — M4806 Spinal stenosis, lumbar region: Secondary | ICD-10-CM | POA: Diagnosis not present

## 2015-05-18 DIAGNOSIS — Z9181 History of falling: Secondary | ICD-10-CM | POA: Diagnosis not present

## 2015-05-18 DIAGNOSIS — J449 Chronic obstructive pulmonary disease, unspecified: Secondary | ICD-10-CM | POA: Diagnosis not present

## 2015-05-18 DIAGNOSIS — Z87891 Personal history of nicotine dependence: Secondary | ICD-10-CM | POA: Diagnosis not present

## 2015-05-18 DIAGNOSIS — Z9981 Dependence on supplemental oxygen: Secondary | ICD-10-CM | POA: Diagnosis not present

## 2015-05-20 DIAGNOSIS — T8189XD Other complications of procedures, not elsewhere classified, subsequent encounter: Secondary | ICD-10-CM | POA: Diagnosis not present

## 2015-05-20 DIAGNOSIS — M4806 Spinal stenosis, lumbar region: Secondary | ICD-10-CM | POA: Diagnosis not present

## 2015-05-20 DIAGNOSIS — E119 Type 2 diabetes mellitus without complications: Secondary | ICD-10-CM | POA: Diagnosis not present

## 2015-05-20 DIAGNOSIS — Z9981 Dependence on supplemental oxygen: Secondary | ICD-10-CM | POA: Diagnosis not present

## 2015-05-20 DIAGNOSIS — J449 Chronic obstructive pulmonary disease, unspecified: Secondary | ICD-10-CM | POA: Diagnosis not present

## 2015-05-20 DIAGNOSIS — I10 Essential (primary) hypertension: Secondary | ICD-10-CM | POA: Diagnosis not present

## 2015-05-20 DIAGNOSIS — Z9181 History of falling: Secondary | ICD-10-CM | POA: Diagnosis not present

## 2015-05-20 DIAGNOSIS — I2510 Atherosclerotic heart disease of native coronary artery without angina pectoris: Secondary | ICD-10-CM | POA: Diagnosis not present

## 2015-05-20 DIAGNOSIS — Z87891 Personal history of nicotine dependence: Secondary | ICD-10-CM | POA: Diagnosis not present

## 2015-05-22 DIAGNOSIS — T8189XD Other complications of procedures, not elsewhere classified, subsequent encounter: Secondary | ICD-10-CM | POA: Diagnosis not present

## 2015-05-22 DIAGNOSIS — M4806 Spinal stenosis, lumbar region: Secondary | ICD-10-CM | POA: Diagnosis not present

## 2015-05-22 DIAGNOSIS — Z9181 History of falling: Secondary | ICD-10-CM | POA: Diagnosis not present

## 2015-05-22 DIAGNOSIS — Z87891 Personal history of nicotine dependence: Secondary | ICD-10-CM | POA: Diagnosis not present

## 2015-05-22 DIAGNOSIS — I2510 Atherosclerotic heart disease of native coronary artery without angina pectoris: Secondary | ICD-10-CM | POA: Diagnosis not present

## 2015-05-22 DIAGNOSIS — Z9981 Dependence on supplemental oxygen: Secondary | ICD-10-CM | POA: Diagnosis not present

## 2015-05-22 DIAGNOSIS — I10 Essential (primary) hypertension: Secondary | ICD-10-CM | POA: Diagnosis not present

## 2015-05-22 DIAGNOSIS — J449 Chronic obstructive pulmonary disease, unspecified: Secondary | ICD-10-CM | POA: Diagnosis not present

## 2015-05-22 DIAGNOSIS — E119 Type 2 diabetes mellitus without complications: Secondary | ICD-10-CM | POA: Diagnosis not present

## 2015-05-23 DIAGNOSIS — M21372 Foot drop, left foot: Secondary | ICD-10-CM | POA: Diagnosis not present

## 2015-05-23 DIAGNOSIS — C61 Malignant neoplasm of prostate: Secondary | ICD-10-CM | POA: Diagnosis not present

## 2015-05-23 DIAGNOSIS — I10 Essential (primary) hypertension: Secondary | ICD-10-CM | POA: Diagnosis not present

## 2015-05-23 DIAGNOSIS — E119 Type 2 diabetes mellitus without complications: Secondary | ICD-10-CM | POA: Diagnosis not present

## 2015-05-24 DIAGNOSIS — Z9181 History of falling: Secondary | ICD-10-CM | POA: Diagnosis not present

## 2015-05-24 DIAGNOSIS — I10 Essential (primary) hypertension: Secondary | ICD-10-CM | POA: Diagnosis not present

## 2015-05-24 DIAGNOSIS — Z9981 Dependence on supplemental oxygen: Secondary | ICD-10-CM | POA: Diagnosis not present

## 2015-05-24 DIAGNOSIS — I2510 Atherosclerotic heart disease of native coronary artery without angina pectoris: Secondary | ICD-10-CM | POA: Diagnosis not present

## 2015-05-24 DIAGNOSIS — E119 Type 2 diabetes mellitus without complications: Secondary | ICD-10-CM | POA: Diagnosis not present

## 2015-05-24 DIAGNOSIS — J449 Chronic obstructive pulmonary disease, unspecified: Secondary | ICD-10-CM | POA: Diagnosis not present

## 2015-05-24 DIAGNOSIS — M4806 Spinal stenosis, lumbar region: Secondary | ICD-10-CM | POA: Diagnosis not present

## 2015-05-24 DIAGNOSIS — T8189XD Other complications of procedures, not elsewhere classified, subsequent encounter: Secondary | ICD-10-CM | POA: Diagnosis not present

## 2015-05-24 DIAGNOSIS — Z87891 Personal history of nicotine dependence: Secondary | ICD-10-CM | POA: Diagnosis not present

## 2015-05-27 DIAGNOSIS — Z9981 Dependence on supplemental oxygen: Secondary | ICD-10-CM | POA: Diagnosis not present

## 2015-05-27 DIAGNOSIS — Z9181 History of falling: Secondary | ICD-10-CM | POA: Diagnosis not present

## 2015-05-27 DIAGNOSIS — M4806 Spinal stenosis, lumbar region: Secondary | ICD-10-CM | POA: Diagnosis not present

## 2015-05-27 DIAGNOSIS — T8189XD Other complications of procedures, not elsewhere classified, subsequent encounter: Secondary | ICD-10-CM | POA: Diagnosis not present

## 2015-05-27 DIAGNOSIS — I2510 Atherosclerotic heart disease of native coronary artery without angina pectoris: Secondary | ICD-10-CM | POA: Diagnosis not present

## 2015-05-27 DIAGNOSIS — J449 Chronic obstructive pulmonary disease, unspecified: Secondary | ICD-10-CM | POA: Diagnosis not present

## 2015-05-27 DIAGNOSIS — I10 Essential (primary) hypertension: Secondary | ICD-10-CM | POA: Diagnosis not present

## 2015-05-27 DIAGNOSIS — E119 Type 2 diabetes mellitus without complications: Secondary | ICD-10-CM | POA: Diagnosis not present

## 2015-05-27 DIAGNOSIS — Z87891 Personal history of nicotine dependence: Secondary | ICD-10-CM | POA: Diagnosis not present

## 2015-05-29 DIAGNOSIS — Z87891 Personal history of nicotine dependence: Secondary | ICD-10-CM | POA: Diagnosis not present

## 2015-05-29 DIAGNOSIS — T8189XD Other complications of procedures, not elsewhere classified, subsequent encounter: Secondary | ICD-10-CM | POA: Diagnosis not present

## 2015-05-29 DIAGNOSIS — M4806 Spinal stenosis, lumbar region: Secondary | ICD-10-CM | POA: Diagnosis not present

## 2015-05-29 DIAGNOSIS — Z9181 History of falling: Secondary | ICD-10-CM | POA: Diagnosis not present

## 2015-05-29 DIAGNOSIS — I10 Essential (primary) hypertension: Secondary | ICD-10-CM | POA: Diagnosis not present

## 2015-05-29 DIAGNOSIS — J449 Chronic obstructive pulmonary disease, unspecified: Secondary | ICD-10-CM | POA: Diagnosis not present

## 2015-05-29 DIAGNOSIS — I2510 Atherosclerotic heart disease of native coronary artery without angina pectoris: Secondary | ICD-10-CM | POA: Diagnosis not present

## 2015-05-29 DIAGNOSIS — Z9981 Dependence on supplemental oxygen: Secondary | ICD-10-CM | POA: Diagnosis not present

## 2015-05-29 DIAGNOSIS — E119 Type 2 diabetes mellitus without complications: Secondary | ICD-10-CM | POA: Diagnosis not present

## 2015-05-31 DIAGNOSIS — Z9981 Dependence on supplemental oxygen: Secondary | ICD-10-CM | POA: Diagnosis not present

## 2015-05-31 DIAGNOSIS — I10 Essential (primary) hypertension: Secondary | ICD-10-CM | POA: Diagnosis not present

## 2015-05-31 DIAGNOSIS — T8189XD Other complications of procedures, not elsewhere classified, subsequent encounter: Secondary | ICD-10-CM | POA: Diagnosis not present

## 2015-05-31 DIAGNOSIS — J449 Chronic obstructive pulmonary disease, unspecified: Secondary | ICD-10-CM | POA: Diagnosis not present

## 2015-05-31 DIAGNOSIS — Z9181 History of falling: Secondary | ICD-10-CM | POA: Diagnosis not present

## 2015-05-31 DIAGNOSIS — Z87891 Personal history of nicotine dependence: Secondary | ICD-10-CM | POA: Diagnosis not present

## 2015-05-31 DIAGNOSIS — E119 Type 2 diabetes mellitus without complications: Secondary | ICD-10-CM | POA: Diagnosis not present

## 2015-05-31 DIAGNOSIS — I2510 Atherosclerotic heart disease of native coronary artery without angina pectoris: Secondary | ICD-10-CM | POA: Diagnosis not present

## 2015-05-31 DIAGNOSIS — M4806 Spinal stenosis, lumbar region: Secondary | ICD-10-CM | POA: Diagnosis not present

## 2015-06-03 DIAGNOSIS — I2510 Atherosclerotic heart disease of native coronary artery without angina pectoris: Secondary | ICD-10-CM | POA: Diagnosis not present

## 2015-06-03 DIAGNOSIS — Z9981 Dependence on supplemental oxygen: Secondary | ICD-10-CM | POA: Diagnosis not present

## 2015-06-03 DIAGNOSIS — I10 Essential (primary) hypertension: Secondary | ICD-10-CM | POA: Diagnosis not present

## 2015-06-03 DIAGNOSIS — E119 Type 2 diabetes mellitus without complications: Secondary | ICD-10-CM | POA: Diagnosis not present

## 2015-06-03 DIAGNOSIS — R269 Unspecified abnormalities of gait and mobility: Secondary | ICD-10-CM | POA: Diagnosis not present

## 2015-06-03 DIAGNOSIS — F39 Unspecified mood [affective] disorder: Secondary | ICD-10-CM | POA: Diagnosis not present

## 2015-06-03 DIAGNOSIS — T8189XD Other complications of procedures, not elsewhere classified, subsequent encounter: Secondary | ICD-10-CM | POA: Diagnosis not present

## 2015-06-03 DIAGNOSIS — Z9181 History of falling: Secondary | ICD-10-CM | POA: Diagnosis not present

## 2015-06-03 DIAGNOSIS — C61 Malignant neoplasm of prostate: Secondary | ICD-10-CM | POA: Diagnosis not present

## 2015-06-03 DIAGNOSIS — E11621 Type 2 diabetes mellitus with foot ulcer: Secondary | ICD-10-CM | POA: Diagnosis not present

## 2015-06-03 DIAGNOSIS — Z23 Encounter for immunization: Secondary | ICD-10-CM | POA: Diagnosis not present

## 2015-06-03 DIAGNOSIS — Z87891 Personal history of nicotine dependence: Secondary | ICD-10-CM | POA: Diagnosis not present

## 2015-06-03 DIAGNOSIS — J449 Chronic obstructive pulmonary disease, unspecified: Secondary | ICD-10-CM | POA: Diagnosis not present

## 2015-06-03 DIAGNOSIS — Z6830 Body mass index (BMI) 30.0-30.9, adult: Secondary | ICD-10-CM | POA: Diagnosis not present

## 2015-06-03 DIAGNOSIS — M4806 Spinal stenosis, lumbar region: Secondary | ICD-10-CM | POA: Diagnosis not present

## 2015-06-05 DIAGNOSIS — I10 Essential (primary) hypertension: Secondary | ICD-10-CM | POA: Diagnosis not present

## 2015-06-05 DIAGNOSIS — Z9981 Dependence on supplemental oxygen: Secondary | ICD-10-CM | POA: Diagnosis not present

## 2015-06-05 DIAGNOSIS — Z87891 Personal history of nicotine dependence: Secondary | ICD-10-CM | POA: Diagnosis not present

## 2015-06-05 DIAGNOSIS — T8189XD Other complications of procedures, not elsewhere classified, subsequent encounter: Secondary | ICD-10-CM | POA: Diagnosis not present

## 2015-06-05 DIAGNOSIS — I2510 Atherosclerotic heart disease of native coronary artery without angina pectoris: Secondary | ICD-10-CM | POA: Diagnosis not present

## 2015-06-05 DIAGNOSIS — S91302A Unspecified open wound, left foot, initial encounter: Secondary | ICD-10-CM | POA: Diagnosis not present

## 2015-06-05 DIAGNOSIS — Z9181 History of falling: Secondary | ICD-10-CM | POA: Diagnosis not present

## 2015-06-05 DIAGNOSIS — E119 Type 2 diabetes mellitus without complications: Secondary | ICD-10-CM | POA: Diagnosis not present

## 2015-06-05 DIAGNOSIS — M4806 Spinal stenosis, lumbar region: Secondary | ICD-10-CM | POA: Diagnosis not present

## 2015-06-05 DIAGNOSIS — J449 Chronic obstructive pulmonary disease, unspecified: Secondary | ICD-10-CM | POA: Diagnosis not present

## 2015-06-06 DIAGNOSIS — S91302A Unspecified open wound, left foot, initial encounter: Secondary | ICD-10-CM | POA: Diagnosis not present

## 2015-06-07 DIAGNOSIS — I2510 Atherosclerotic heart disease of native coronary artery without angina pectoris: Secondary | ICD-10-CM | POA: Diagnosis not present

## 2015-06-07 DIAGNOSIS — Z9181 History of falling: Secondary | ICD-10-CM | POA: Diagnosis not present

## 2015-06-07 DIAGNOSIS — S91302D Unspecified open wound, left foot, subsequent encounter: Secondary | ICD-10-CM | POA: Diagnosis not present

## 2015-06-07 DIAGNOSIS — J449 Chronic obstructive pulmonary disease, unspecified: Secondary | ICD-10-CM | POA: Diagnosis not present

## 2015-06-07 DIAGNOSIS — E119 Type 2 diabetes mellitus without complications: Secondary | ICD-10-CM | POA: Diagnosis not present

## 2015-06-07 DIAGNOSIS — M4806 Spinal stenosis, lumbar region: Secondary | ICD-10-CM | POA: Diagnosis not present

## 2015-06-07 DIAGNOSIS — I10 Essential (primary) hypertension: Secondary | ICD-10-CM | POA: Diagnosis not present

## 2015-06-07 DIAGNOSIS — Z9981 Dependence on supplemental oxygen: Secondary | ICD-10-CM | POA: Diagnosis not present

## 2015-06-07 DIAGNOSIS — T8189XD Other complications of procedures, not elsewhere classified, subsequent encounter: Secondary | ICD-10-CM | POA: Diagnosis not present

## 2015-06-07 DIAGNOSIS — Z87891 Personal history of nicotine dependence: Secondary | ICD-10-CM | POA: Diagnosis not present

## 2015-06-10 DIAGNOSIS — Z87891 Personal history of nicotine dependence: Secondary | ICD-10-CM | POA: Diagnosis not present

## 2015-06-10 DIAGNOSIS — Z9181 History of falling: Secondary | ICD-10-CM | POA: Diagnosis not present

## 2015-06-10 DIAGNOSIS — E119 Type 2 diabetes mellitus without complications: Secondary | ICD-10-CM | POA: Diagnosis not present

## 2015-06-10 DIAGNOSIS — M4806 Spinal stenosis, lumbar region: Secondary | ICD-10-CM | POA: Diagnosis not present

## 2015-06-10 DIAGNOSIS — I10 Essential (primary) hypertension: Secondary | ICD-10-CM | POA: Diagnosis not present

## 2015-06-10 DIAGNOSIS — I2510 Atherosclerotic heart disease of native coronary artery without angina pectoris: Secondary | ICD-10-CM | POA: Diagnosis not present

## 2015-06-10 DIAGNOSIS — T8189XD Other complications of procedures, not elsewhere classified, subsequent encounter: Secondary | ICD-10-CM | POA: Diagnosis not present

## 2015-06-10 DIAGNOSIS — Z9981 Dependence on supplemental oxygen: Secondary | ICD-10-CM | POA: Diagnosis not present

## 2015-06-10 DIAGNOSIS — J449 Chronic obstructive pulmonary disease, unspecified: Secondary | ICD-10-CM | POA: Diagnosis not present

## 2015-06-12 DIAGNOSIS — Z9981 Dependence on supplemental oxygen: Secondary | ICD-10-CM | POA: Diagnosis not present

## 2015-06-12 DIAGNOSIS — T8189XD Other complications of procedures, not elsewhere classified, subsequent encounter: Secondary | ICD-10-CM | POA: Diagnosis not present

## 2015-06-12 DIAGNOSIS — J449 Chronic obstructive pulmonary disease, unspecified: Secondary | ICD-10-CM | POA: Diagnosis not present

## 2015-06-12 DIAGNOSIS — I2510 Atherosclerotic heart disease of native coronary artery without angina pectoris: Secondary | ICD-10-CM | POA: Diagnosis not present

## 2015-06-12 DIAGNOSIS — Z9181 History of falling: Secondary | ICD-10-CM | POA: Diagnosis not present

## 2015-06-12 DIAGNOSIS — I10 Essential (primary) hypertension: Secondary | ICD-10-CM | POA: Diagnosis not present

## 2015-06-12 DIAGNOSIS — Z87891 Personal history of nicotine dependence: Secondary | ICD-10-CM | POA: Diagnosis not present

## 2015-06-12 DIAGNOSIS — M4806 Spinal stenosis, lumbar region: Secondary | ICD-10-CM | POA: Diagnosis not present

## 2015-06-12 DIAGNOSIS — E119 Type 2 diabetes mellitus without complications: Secondary | ICD-10-CM | POA: Diagnosis not present

## 2015-06-14 DIAGNOSIS — I10 Essential (primary) hypertension: Secondary | ICD-10-CM | POA: Diagnosis not present

## 2015-06-14 DIAGNOSIS — M4806 Spinal stenosis, lumbar region: Secondary | ICD-10-CM | POA: Diagnosis not present

## 2015-06-14 DIAGNOSIS — Z9181 History of falling: Secondary | ICD-10-CM | POA: Diagnosis not present

## 2015-06-14 DIAGNOSIS — Z9981 Dependence on supplemental oxygen: Secondary | ICD-10-CM | POA: Diagnosis not present

## 2015-06-14 DIAGNOSIS — Z87891 Personal history of nicotine dependence: Secondary | ICD-10-CM | POA: Diagnosis not present

## 2015-06-14 DIAGNOSIS — T8189XD Other complications of procedures, not elsewhere classified, subsequent encounter: Secondary | ICD-10-CM | POA: Diagnosis not present

## 2015-06-14 DIAGNOSIS — J449 Chronic obstructive pulmonary disease, unspecified: Secondary | ICD-10-CM | POA: Diagnosis not present

## 2015-06-14 DIAGNOSIS — I2510 Atherosclerotic heart disease of native coronary artery without angina pectoris: Secondary | ICD-10-CM | POA: Diagnosis not present

## 2015-06-14 DIAGNOSIS — E119 Type 2 diabetes mellitus without complications: Secondary | ICD-10-CM | POA: Diagnosis not present

## 2015-06-17 DIAGNOSIS — E119 Type 2 diabetes mellitus without complications: Secondary | ICD-10-CM | POA: Diagnosis not present

## 2015-06-17 DIAGNOSIS — M4806 Spinal stenosis, lumbar region: Secondary | ICD-10-CM | POA: Diagnosis not present

## 2015-06-17 DIAGNOSIS — I2510 Atherosclerotic heart disease of native coronary artery without angina pectoris: Secondary | ICD-10-CM | POA: Diagnosis not present

## 2015-06-17 DIAGNOSIS — Z9981 Dependence on supplemental oxygen: Secondary | ICD-10-CM | POA: Diagnosis not present

## 2015-06-17 DIAGNOSIS — Z9181 History of falling: Secondary | ICD-10-CM | POA: Diagnosis not present

## 2015-06-17 DIAGNOSIS — I10 Essential (primary) hypertension: Secondary | ICD-10-CM | POA: Diagnosis not present

## 2015-06-17 DIAGNOSIS — J449 Chronic obstructive pulmonary disease, unspecified: Secondary | ICD-10-CM | POA: Diagnosis not present

## 2015-06-17 DIAGNOSIS — T8189XD Other complications of procedures, not elsewhere classified, subsequent encounter: Secondary | ICD-10-CM | POA: Diagnosis not present

## 2015-06-17 DIAGNOSIS — S91302A Unspecified open wound, left foot, initial encounter: Secondary | ICD-10-CM | POA: Diagnosis not present

## 2015-06-17 DIAGNOSIS — Z87891 Personal history of nicotine dependence: Secondary | ICD-10-CM | POA: Diagnosis not present

## 2015-06-19 DIAGNOSIS — M4806 Spinal stenosis, lumbar region: Secondary | ICD-10-CM | POA: Diagnosis not present

## 2015-06-19 DIAGNOSIS — Z9181 History of falling: Secondary | ICD-10-CM | POA: Diagnosis not present

## 2015-06-19 DIAGNOSIS — J449 Chronic obstructive pulmonary disease, unspecified: Secondary | ICD-10-CM | POA: Diagnosis not present

## 2015-06-19 DIAGNOSIS — T8189XD Other complications of procedures, not elsewhere classified, subsequent encounter: Secondary | ICD-10-CM | POA: Diagnosis not present

## 2015-06-19 DIAGNOSIS — I2510 Atherosclerotic heart disease of native coronary artery without angina pectoris: Secondary | ICD-10-CM | POA: Diagnosis not present

## 2015-06-19 DIAGNOSIS — E119 Type 2 diabetes mellitus without complications: Secondary | ICD-10-CM | POA: Diagnosis not present

## 2015-06-19 DIAGNOSIS — I10 Essential (primary) hypertension: Secondary | ICD-10-CM | POA: Diagnosis not present

## 2015-06-19 DIAGNOSIS — Z9981 Dependence on supplemental oxygen: Secondary | ICD-10-CM | POA: Diagnosis not present

## 2015-06-19 DIAGNOSIS — Z87891 Personal history of nicotine dependence: Secondary | ICD-10-CM | POA: Diagnosis not present

## 2015-06-21 DIAGNOSIS — I2510 Atherosclerotic heart disease of native coronary artery without angina pectoris: Secondary | ICD-10-CM | POA: Diagnosis not present

## 2015-06-21 DIAGNOSIS — Z9181 History of falling: Secondary | ICD-10-CM | POA: Diagnosis not present

## 2015-06-21 DIAGNOSIS — M4806 Spinal stenosis, lumbar region: Secondary | ICD-10-CM | POA: Diagnosis not present

## 2015-06-21 DIAGNOSIS — T8189XD Other complications of procedures, not elsewhere classified, subsequent encounter: Secondary | ICD-10-CM | POA: Diagnosis not present

## 2015-06-21 DIAGNOSIS — I10 Essential (primary) hypertension: Secondary | ICD-10-CM | POA: Diagnosis not present

## 2015-06-21 DIAGNOSIS — Z9981 Dependence on supplemental oxygen: Secondary | ICD-10-CM | POA: Diagnosis not present

## 2015-06-21 DIAGNOSIS — Z87891 Personal history of nicotine dependence: Secondary | ICD-10-CM | POA: Diagnosis not present

## 2015-06-21 DIAGNOSIS — J449 Chronic obstructive pulmonary disease, unspecified: Secondary | ICD-10-CM | POA: Diagnosis not present

## 2015-06-21 DIAGNOSIS — E119 Type 2 diabetes mellitus without complications: Secondary | ICD-10-CM | POA: Diagnosis not present

## 2015-06-23 DIAGNOSIS — M21372 Foot drop, left foot: Secondary | ICD-10-CM | POA: Diagnosis not present

## 2015-06-23 DIAGNOSIS — C61 Malignant neoplasm of prostate: Secondary | ICD-10-CM | POA: Diagnosis not present

## 2015-06-23 DIAGNOSIS — I10 Essential (primary) hypertension: Secondary | ICD-10-CM | POA: Diagnosis not present

## 2015-06-23 DIAGNOSIS — E119 Type 2 diabetes mellitus without complications: Secondary | ICD-10-CM | POA: Diagnosis not present

## 2015-06-24 DIAGNOSIS — C61 Malignant neoplasm of prostate: Secondary | ICD-10-CM | POA: Diagnosis not present

## 2015-06-24 DIAGNOSIS — T8189XD Other complications of procedures, not elsewhere classified, subsequent encounter: Secondary | ICD-10-CM | POA: Diagnosis not present

## 2015-06-24 DIAGNOSIS — Z9181 History of falling: Secondary | ICD-10-CM | POA: Diagnosis not present

## 2015-06-24 DIAGNOSIS — R591 Generalized enlarged lymph nodes: Secondary | ICD-10-CM | POA: Diagnosis not present

## 2015-06-24 DIAGNOSIS — Z9981 Dependence on supplemental oxygen: Secondary | ICD-10-CM | POA: Diagnosis not present

## 2015-06-24 DIAGNOSIS — M4806 Spinal stenosis, lumbar region: Secondary | ICD-10-CM | POA: Diagnosis not present

## 2015-06-24 DIAGNOSIS — Z87891 Personal history of nicotine dependence: Secondary | ICD-10-CM | POA: Diagnosis not present

## 2015-06-24 DIAGNOSIS — J449 Chronic obstructive pulmonary disease, unspecified: Secondary | ICD-10-CM | POA: Diagnosis not present

## 2015-06-24 DIAGNOSIS — I2510 Atherosclerotic heart disease of native coronary artery without angina pectoris: Secondary | ICD-10-CM | POA: Diagnosis not present

## 2015-06-24 DIAGNOSIS — N5201 Erectile dysfunction due to arterial insufficiency: Secondary | ICD-10-CM | POA: Diagnosis not present

## 2015-06-24 DIAGNOSIS — I10 Essential (primary) hypertension: Secondary | ICD-10-CM | POA: Diagnosis not present

## 2015-06-24 DIAGNOSIS — E119 Type 2 diabetes mellitus without complications: Secondary | ICD-10-CM | POA: Diagnosis not present

## 2015-06-26 DIAGNOSIS — M4806 Spinal stenosis, lumbar region: Secondary | ICD-10-CM | POA: Diagnosis not present

## 2015-06-26 DIAGNOSIS — Z87891 Personal history of nicotine dependence: Secondary | ICD-10-CM | POA: Diagnosis not present

## 2015-06-26 DIAGNOSIS — Z9181 History of falling: Secondary | ICD-10-CM | POA: Diagnosis not present

## 2015-06-26 DIAGNOSIS — Z9981 Dependence on supplemental oxygen: Secondary | ICD-10-CM | POA: Diagnosis not present

## 2015-06-26 DIAGNOSIS — I2510 Atherosclerotic heart disease of native coronary artery without angina pectoris: Secondary | ICD-10-CM | POA: Diagnosis not present

## 2015-06-26 DIAGNOSIS — I10 Essential (primary) hypertension: Secondary | ICD-10-CM | POA: Diagnosis not present

## 2015-06-26 DIAGNOSIS — T8189XD Other complications of procedures, not elsewhere classified, subsequent encounter: Secondary | ICD-10-CM | POA: Diagnosis not present

## 2015-06-26 DIAGNOSIS — E119 Type 2 diabetes mellitus without complications: Secondary | ICD-10-CM | POA: Diagnosis not present

## 2015-06-26 DIAGNOSIS — J449 Chronic obstructive pulmonary disease, unspecified: Secondary | ICD-10-CM | POA: Diagnosis not present

## 2015-06-28 DIAGNOSIS — J449 Chronic obstructive pulmonary disease, unspecified: Secondary | ICD-10-CM | POA: Diagnosis not present

## 2015-06-28 DIAGNOSIS — T8189XD Other complications of procedures, not elsewhere classified, subsequent encounter: Secondary | ICD-10-CM | POA: Diagnosis not present

## 2015-06-28 DIAGNOSIS — Z9181 History of falling: Secondary | ICD-10-CM | POA: Diagnosis not present

## 2015-06-28 DIAGNOSIS — E119 Type 2 diabetes mellitus without complications: Secondary | ICD-10-CM | POA: Diagnosis not present

## 2015-06-28 DIAGNOSIS — Z9981 Dependence on supplemental oxygen: Secondary | ICD-10-CM | POA: Diagnosis not present

## 2015-06-28 DIAGNOSIS — I2510 Atherosclerotic heart disease of native coronary artery without angina pectoris: Secondary | ICD-10-CM | POA: Diagnosis not present

## 2015-06-28 DIAGNOSIS — Z87891 Personal history of nicotine dependence: Secondary | ICD-10-CM | POA: Diagnosis not present

## 2015-06-28 DIAGNOSIS — M4806 Spinal stenosis, lumbar region: Secondary | ICD-10-CM | POA: Diagnosis not present

## 2015-06-28 DIAGNOSIS — I10 Essential (primary) hypertension: Secondary | ICD-10-CM | POA: Diagnosis not present

## 2015-07-01 DIAGNOSIS — Z9181 History of falling: Secondary | ICD-10-CM | POA: Diagnosis not present

## 2015-07-01 DIAGNOSIS — J449 Chronic obstructive pulmonary disease, unspecified: Secondary | ICD-10-CM | POA: Diagnosis not present

## 2015-07-01 DIAGNOSIS — I10 Essential (primary) hypertension: Secondary | ICD-10-CM | POA: Diagnosis not present

## 2015-07-01 DIAGNOSIS — Z87891 Personal history of nicotine dependence: Secondary | ICD-10-CM | POA: Diagnosis not present

## 2015-07-01 DIAGNOSIS — E119 Type 2 diabetes mellitus without complications: Secondary | ICD-10-CM | POA: Diagnosis not present

## 2015-07-01 DIAGNOSIS — M4806 Spinal stenosis, lumbar region: Secondary | ICD-10-CM | POA: Diagnosis not present

## 2015-07-01 DIAGNOSIS — T8189XD Other complications of procedures, not elsewhere classified, subsequent encounter: Secondary | ICD-10-CM | POA: Diagnosis not present

## 2015-07-01 DIAGNOSIS — I2510 Atherosclerotic heart disease of native coronary artery without angina pectoris: Secondary | ICD-10-CM | POA: Diagnosis not present

## 2015-07-01 DIAGNOSIS — Z9981 Dependence on supplemental oxygen: Secondary | ICD-10-CM | POA: Diagnosis not present

## 2015-07-03 DIAGNOSIS — M4806 Spinal stenosis, lumbar region: Secondary | ICD-10-CM | POA: Diagnosis not present

## 2015-07-03 DIAGNOSIS — I2510 Atherosclerotic heart disease of native coronary artery without angina pectoris: Secondary | ICD-10-CM | POA: Diagnosis not present

## 2015-07-03 DIAGNOSIS — E119 Type 2 diabetes mellitus without complications: Secondary | ICD-10-CM | POA: Diagnosis not present

## 2015-07-03 DIAGNOSIS — J449 Chronic obstructive pulmonary disease, unspecified: Secondary | ICD-10-CM | POA: Diagnosis not present

## 2015-07-03 DIAGNOSIS — T8189XD Other complications of procedures, not elsewhere classified, subsequent encounter: Secondary | ICD-10-CM | POA: Diagnosis not present

## 2015-07-03 DIAGNOSIS — Z9981 Dependence on supplemental oxygen: Secondary | ICD-10-CM | POA: Diagnosis not present

## 2015-07-03 DIAGNOSIS — I10 Essential (primary) hypertension: Secondary | ICD-10-CM | POA: Diagnosis not present

## 2015-07-03 DIAGNOSIS — Z87891 Personal history of nicotine dependence: Secondary | ICD-10-CM | POA: Diagnosis not present

## 2015-07-03 DIAGNOSIS — Z9181 History of falling: Secondary | ICD-10-CM | POA: Diagnosis not present

## 2015-07-04 DIAGNOSIS — J449 Chronic obstructive pulmonary disease, unspecified: Secondary | ICD-10-CM | POA: Diagnosis not present

## 2015-07-05 DIAGNOSIS — M958 Other specified acquired deformities of musculoskeletal system: Secondary | ICD-10-CM | POA: Diagnosis not present

## 2015-07-05 DIAGNOSIS — E119 Type 2 diabetes mellitus without complications: Secondary | ICD-10-CM | POA: Diagnosis not present

## 2015-07-05 DIAGNOSIS — M216X2 Other acquired deformities of left foot: Secondary | ICD-10-CM | POA: Diagnosis not present

## 2015-07-05 DIAGNOSIS — Z9889 Other specified postprocedural states: Secondary | ICD-10-CM | POA: Diagnosis not present

## 2015-07-05 DIAGNOSIS — Z7982 Long term (current) use of aspirin: Secondary | ICD-10-CM | POA: Diagnosis not present

## 2015-07-05 DIAGNOSIS — S91302D Unspecified open wound, left foot, subsequent encounter: Secondary | ICD-10-CM | POA: Diagnosis not present

## 2015-07-05 DIAGNOSIS — Z87891 Personal history of nicotine dependence: Secondary | ICD-10-CM | POA: Diagnosis not present

## 2015-07-05 DIAGNOSIS — I10 Essential (primary) hypertension: Secondary | ICD-10-CM | POA: Diagnosis not present

## 2015-07-05 DIAGNOSIS — Z79899 Other long term (current) drug therapy: Secondary | ICD-10-CM | POA: Diagnosis not present

## 2015-07-08 DIAGNOSIS — M4806 Spinal stenosis, lumbar region: Secondary | ICD-10-CM | POA: Diagnosis not present

## 2015-07-08 DIAGNOSIS — I10 Essential (primary) hypertension: Secondary | ICD-10-CM | POA: Diagnosis not present

## 2015-07-08 DIAGNOSIS — J449 Chronic obstructive pulmonary disease, unspecified: Secondary | ICD-10-CM | POA: Diagnosis not present

## 2015-07-08 DIAGNOSIS — Z9981 Dependence on supplemental oxygen: Secondary | ICD-10-CM | POA: Diagnosis not present

## 2015-07-08 DIAGNOSIS — E119 Type 2 diabetes mellitus without complications: Secondary | ICD-10-CM | POA: Diagnosis not present

## 2015-07-08 DIAGNOSIS — Z87891 Personal history of nicotine dependence: Secondary | ICD-10-CM | POA: Diagnosis not present

## 2015-07-08 DIAGNOSIS — T8189XD Other complications of procedures, not elsewhere classified, subsequent encounter: Secondary | ICD-10-CM | POA: Diagnosis not present

## 2015-07-08 DIAGNOSIS — I2510 Atherosclerotic heart disease of native coronary artery without angina pectoris: Secondary | ICD-10-CM | POA: Diagnosis not present

## 2015-07-08 DIAGNOSIS — Z9181 History of falling: Secondary | ICD-10-CM | POA: Diagnosis not present

## 2015-07-10 DIAGNOSIS — Z87891 Personal history of nicotine dependence: Secondary | ICD-10-CM | POA: Diagnosis not present

## 2015-07-10 DIAGNOSIS — E119 Type 2 diabetes mellitus without complications: Secondary | ICD-10-CM | POA: Diagnosis not present

## 2015-07-10 DIAGNOSIS — I10 Essential (primary) hypertension: Secondary | ICD-10-CM | POA: Diagnosis not present

## 2015-07-10 DIAGNOSIS — I2510 Atherosclerotic heart disease of native coronary artery without angina pectoris: Secondary | ICD-10-CM | POA: Diagnosis not present

## 2015-07-10 DIAGNOSIS — J449 Chronic obstructive pulmonary disease, unspecified: Secondary | ICD-10-CM | POA: Diagnosis not present

## 2015-07-10 DIAGNOSIS — M4806 Spinal stenosis, lumbar region: Secondary | ICD-10-CM | POA: Diagnosis not present

## 2015-07-10 DIAGNOSIS — Z9181 History of falling: Secondary | ICD-10-CM | POA: Diagnosis not present

## 2015-07-10 DIAGNOSIS — T8189XD Other complications of procedures, not elsewhere classified, subsequent encounter: Secondary | ICD-10-CM | POA: Diagnosis not present

## 2015-07-10 DIAGNOSIS — Z9981 Dependence on supplemental oxygen: Secondary | ICD-10-CM | POA: Diagnosis not present

## 2015-07-11 DIAGNOSIS — T8189XD Other complications of procedures, not elsewhere classified, subsequent encounter: Secondary | ICD-10-CM | POA: Diagnosis not present

## 2015-07-11 DIAGNOSIS — J449 Chronic obstructive pulmonary disease, unspecified: Secondary | ICD-10-CM | POA: Diagnosis not present

## 2015-07-11 DIAGNOSIS — E119 Type 2 diabetes mellitus without complications: Secondary | ICD-10-CM | POA: Diagnosis not present

## 2015-07-11 DIAGNOSIS — I10 Essential (primary) hypertension: Secondary | ICD-10-CM | POA: Diagnosis not present

## 2015-07-11 DIAGNOSIS — I2510 Atherosclerotic heart disease of native coronary artery without angina pectoris: Secondary | ICD-10-CM | POA: Diagnosis not present

## 2015-07-11 DIAGNOSIS — Z87891 Personal history of nicotine dependence: Secondary | ICD-10-CM | POA: Diagnosis not present

## 2015-07-11 DIAGNOSIS — M4806 Spinal stenosis, lumbar region: Secondary | ICD-10-CM | POA: Diagnosis not present

## 2015-07-11 DIAGNOSIS — Z9981 Dependence on supplemental oxygen: Secondary | ICD-10-CM | POA: Diagnosis not present

## 2015-07-11 DIAGNOSIS — Z9181 History of falling: Secondary | ICD-10-CM | POA: Diagnosis not present

## 2015-07-15 DIAGNOSIS — I10 Essential (primary) hypertension: Secondary | ICD-10-CM | POA: Diagnosis not present

## 2015-07-15 DIAGNOSIS — E119 Type 2 diabetes mellitus without complications: Secondary | ICD-10-CM | POA: Diagnosis not present

## 2015-07-15 DIAGNOSIS — Z9181 History of falling: Secondary | ICD-10-CM | POA: Diagnosis not present

## 2015-07-15 DIAGNOSIS — J449 Chronic obstructive pulmonary disease, unspecified: Secondary | ICD-10-CM | POA: Diagnosis not present

## 2015-07-15 DIAGNOSIS — T8189XD Other complications of procedures, not elsewhere classified, subsequent encounter: Secondary | ICD-10-CM | POA: Diagnosis not present

## 2015-07-15 DIAGNOSIS — M4806 Spinal stenosis, lumbar region: Secondary | ICD-10-CM | POA: Diagnosis not present

## 2015-07-15 DIAGNOSIS — I2510 Atherosclerotic heart disease of native coronary artery without angina pectoris: Secondary | ICD-10-CM | POA: Diagnosis not present

## 2015-07-15 DIAGNOSIS — Z87891 Personal history of nicotine dependence: Secondary | ICD-10-CM | POA: Diagnosis not present

## 2015-07-15 DIAGNOSIS — Z9981 Dependence on supplemental oxygen: Secondary | ICD-10-CM | POA: Diagnosis not present

## 2015-07-16 DIAGNOSIS — Z9181 History of falling: Secondary | ICD-10-CM | POA: Diagnosis not present

## 2015-07-16 DIAGNOSIS — I2510 Atherosclerotic heart disease of native coronary artery without angina pectoris: Secondary | ICD-10-CM | POA: Diagnosis not present

## 2015-07-16 DIAGNOSIS — T8189XD Other complications of procedures, not elsewhere classified, subsequent encounter: Secondary | ICD-10-CM | POA: Diagnosis not present

## 2015-07-16 DIAGNOSIS — E119 Type 2 diabetes mellitus without complications: Secondary | ICD-10-CM | POA: Diagnosis not present

## 2015-07-16 DIAGNOSIS — Z9981 Dependence on supplemental oxygen: Secondary | ICD-10-CM | POA: Diagnosis not present

## 2015-07-16 DIAGNOSIS — I10 Essential (primary) hypertension: Secondary | ICD-10-CM | POA: Diagnosis not present

## 2015-07-16 DIAGNOSIS — M4806 Spinal stenosis, lumbar region: Secondary | ICD-10-CM | POA: Diagnosis not present

## 2015-07-16 DIAGNOSIS — J449 Chronic obstructive pulmonary disease, unspecified: Secondary | ICD-10-CM | POA: Diagnosis not present

## 2015-07-16 DIAGNOSIS — Z87891 Personal history of nicotine dependence: Secondary | ICD-10-CM | POA: Diagnosis not present

## 2015-07-17 DIAGNOSIS — Z87891 Personal history of nicotine dependence: Secondary | ICD-10-CM | POA: Diagnosis not present

## 2015-07-17 DIAGNOSIS — E119 Type 2 diabetes mellitus without complications: Secondary | ICD-10-CM | POA: Diagnosis not present

## 2015-07-17 DIAGNOSIS — J449 Chronic obstructive pulmonary disease, unspecified: Secondary | ICD-10-CM | POA: Diagnosis not present

## 2015-07-17 DIAGNOSIS — I10 Essential (primary) hypertension: Secondary | ICD-10-CM | POA: Diagnosis not present

## 2015-07-17 DIAGNOSIS — M4806 Spinal stenosis, lumbar region: Secondary | ICD-10-CM | POA: Diagnosis not present

## 2015-07-17 DIAGNOSIS — I2510 Atherosclerotic heart disease of native coronary artery without angina pectoris: Secondary | ICD-10-CM | POA: Diagnosis not present

## 2015-07-17 DIAGNOSIS — T8189XD Other complications of procedures, not elsewhere classified, subsequent encounter: Secondary | ICD-10-CM | POA: Diagnosis not present

## 2015-07-17 DIAGNOSIS — Z9981 Dependence on supplemental oxygen: Secondary | ICD-10-CM | POA: Diagnosis not present

## 2015-07-17 DIAGNOSIS — Z9181 History of falling: Secondary | ICD-10-CM | POA: Diagnosis not present

## 2015-07-18 DIAGNOSIS — E119 Type 2 diabetes mellitus without complications: Secondary | ICD-10-CM | POA: Diagnosis not present

## 2015-07-18 DIAGNOSIS — J449 Chronic obstructive pulmonary disease, unspecified: Secondary | ICD-10-CM | POA: Diagnosis not present

## 2015-07-18 DIAGNOSIS — I2510 Atherosclerotic heart disease of native coronary artery without angina pectoris: Secondary | ICD-10-CM | POA: Diagnosis not present

## 2015-07-18 DIAGNOSIS — I10 Essential (primary) hypertension: Secondary | ICD-10-CM | POA: Diagnosis not present

## 2015-07-18 DIAGNOSIS — Z9181 History of falling: Secondary | ICD-10-CM | POA: Diagnosis not present

## 2015-07-18 DIAGNOSIS — Z9981 Dependence on supplemental oxygen: Secondary | ICD-10-CM | POA: Diagnosis not present

## 2015-07-18 DIAGNOSIS — M4806 Spinal stenosis, lumbar region: Secondary | ICD-10-CM | POA: Diagnosis not present

## 2015-07-18 DIAGNOSIS — T8189XD Other complications of procedures, not elsewhere classified, subsequent encounter: Secondary | ICD-10-CM | POA: Diagnosis not present

## 2015-07-18 DIAGNOSIS — Z87891 Personal history of nicotine dependence: Secondary | ICD-10-CM | POA: Diagnosis not present

## 2015-07-19 DIAGNOSIS — M4806 Spinal stenosis, lumbar region: Secondary | ICD-10-CM | POA: Diagnosis not present

## 2015-07-19 DIAGNOSIS — J449 Chronic obstructive pulmonary disease, unspecified: Secondary | ICD-10-CM | POA: Diagnosis not present

## 2015-07-19 DIAGNOSIS — I2510 Atherosclerotic heart disease of native coronary artery without angina pectoris: Secondary | ICD-10-CM | POA: Diagnosis not present

## 2015-07-19 DIAGNOSIS — I10 Essential (primary) hypertension: Secondary | ICD-10-CM | POA: Diagnosis not present

## 2015-07-19 DIAGNOSIS — Z9981 Dependence on supplemental oxygen: Secondary | ICD-10-CM | POA: Diagnosis not present

## 2015-07-19 DIAGNOSIS — Z87891 Personal history of nicotine dependence: Secondary | ICD-10-CM | POA: Diagnosis not present

## 2015-07-19 DIAGNOSIS — Z9181 History of falling: Secondary | ICD-10-CM | POA: Diagnosis not present

## 2015-07-19 DIAGNOSIS — E119 Type 2 diabetes mellitus without complications: Secondary | ICD-10-CM | POA: Diagnosis not present

## 2015-07-19 DIAGNOSIS — T8189XD Other complications of procedures, not elsewhere classified, subsequent encounter: Secondary | ICD-10-CM | POA: Diagnosis not present

## 2015-07-22 DIAGNOSIS — I10 Essential (primary) hypertension: Secondary | ICD-10-CM | POA: Diagnosis not present

## 2015-07-22 DIAGNOSIS — Z9981 Dependence on supplemental oxygen: Secondary | ICD-10-CM | POA: Diagnosis not present

## 2015-07-22 DIAGNOSIS — E119 Type 2 diabetes mellitus without complications: Secondary | ICD-10-CM | POA: Diagnosis not present

## 2015-07-22 DIAGNOSIS — T8189XD Other complications of procedures, not elsewhere classified, subsequent encounter: Secondary | ICD-10-CM | POA: Diagnosis not present

## 2015-07-22 DIAGNOSIS — I2510 Atherosclerotic heart disease of native coronary artery without angina pectoris: Secondary | ICD-10-CM | POA: Diagnosis not present

## 2015-07-22 DIAGNOSIS — Z87891 Personal history of nicotine dependence: Secondary | ICD-10-CM | POA: Diagnosis not present

## 2015-07-22 DIAGNOSIS — J449 Chronic obstructive pulmonary disease, unspecified: Secondary | ICD-10-CM | POA: Diagnosis not present

## 2015-07-22 DIAGNOSIS — Z9181 History of falling: Secondary | ICD-10-CM | POA: Diagnosis not present

## 2015-07-22 DIAGNOSIS — M4806 Spinal stenosis, lumbar region: Secondary | ICD-10-CM | POA: Diagnosis not present

## 2015-07-23 DIAGNOSIS — E119 Type 2 diabetes mellitus without complications: Secondary | ICD-10-CM | POA: Diagnosis not present

## 2015-07-23 DIAGNOSIS — I10 Essential (primary) hypertension: Secondary | ICD-10-CM | POA: Diagnosis not present

## 2015-07-23 DIAGNOSIS — C61 Malignant neoplasm of prostate: Secondary | ICD-10-CM | POA: Diagnosis not present

## 2015-07-23 DIAGNOSIS — M21372 Foot drop, left foot: Secondary | ICD-10-CM | POA: Diagnosis not present

## 2015-07-24 DIAGNOSIS — M4806 Spinal stenosis, lumbar region: Secondary | ICD-10-CM | POA: Diagnosis not present

## 2015-07-24 DIAGNOSIS — E119 Type 2 diabetes mellitus without complications: Secondary | ICD-10-CM | POA: Diagnosis not present

## 2015-07-24 DIAGNOSIS — I2510 Atherosclerotic heart disease of native coronary artery without angina pectoris: Secondary | ICD-10-CM | POA: Diagnosis not present

## 2015-07-24 DIAGNOSIS — T8189XD Other complications of procedures, not elsewhere classified, subsequent encounter: Secondary | ICD-10-CM | POA: Diagnosis not present

## 2015-07-24 DIAGNOSIS — J449 Chronic obstructive pulmonary disease, unspecified: Secondary | ICD-10-CM | POA: Diagnosis not present

## 2015-07-24 DIAGNOSIS — Z87891 Personal history of nicotine dependence: Secondary | ICD-10-CM | POA: Diagnosis not present

## 2015-07-24 DIAGNOSIS — Z9981 Dependence on supplemental oxygen: Secondary | ICD-10-CM | POA: Diagnosis not present

## 2015-07-24 DIAGNOSIS — Z9181 History of falling: Secondary | ICD-10-CM | POA: Diagnosis not present

## 2015-07-24 DIAGNOSIS — I10 Essential (primary) hypertension: Secondary | ICD-10-CM | POA: Diagnosis not present

## 2015-07-26 DIAGNOSIS — T8189XD Other complications of procedures, not elsewhere classified, subsequent encounter: Secondary | ICD-10-CM | POA: Diagnosis not present

## 2015-07-26 DIAGNOSIS — I2510 Atherosclerotic heart disease of native coronary artery without angina pectoris: Secondary | ICD-10-CM | POA: Diagnosis not present

## 2015-07-26 DIAGNOSIS — Z9181 History of falling: Secondary | ICD-10-CM | POA: Diagnosis not present

## 2015-07-26 DIAGNOSIS — Z87891 Personal history of nicotine dependence: Secondary | ICD-10-CM | POA: Diagnosis not present

## 2015-07-26 DIAGNOSIS — E119 Type 2 diabetes mellitus without complications: Secondary | ICD-10-CM | POA: Diagnosis not present

## 2015-07-26 DIAGNOSIS — M4806 Spinal stenosis, lumbar region: Secondary | ICD-10-CM | POA: Diagnosis not present

## 2015-07-26 DIAGNOSIS — Z9981 Dependence on supplemental oxygen: Secondary | ICD-10-CM | POA: Diagnosis not present

## 2015-07-26 DIAGNOSIS — I10 Essential (primary) hypertension: Secondary | ICD-10-CM | POA: Diagnosis not present

## 2015-07-26 DIAGNOSIS — J449 Chronic obstructive pulmonary disease, unspecified: Secondary | ICD-10-CM | POA: Diagnosis not present

## 2015-07-30 DIAGNOSIS — I10 Essential (primary) hypertension: Secondary | ICD-10-CM | POA: Diagnosis not present

## 2015-07-30 DIAGNOSIS — Z9981 Dependence on supplemental oxygen: Secondary | ICD-10-CM | POA: Diagnosis not present

## 2015-07-30 DIAGNOSIS — J449 Chronic obstructive pulmonary disease, unspecified: Secondary | ICD-10-CM | POA: Diagnosis not present

## 2015-07-30 DIAGNOSIS — T8189XD Other complications of procedures, not elsewhere classified, subsequent encounter: Secondary | ICD-10-CM | POA: Diagnosis not present

## 2015-07-30 DIAGNOSIS — Z87891 Personal history of nicotine dependence: Secondary | ICD-10-CM | POA: Diagnosis not present

## 2015-07-30 DIAGNOSIS — Z9181 History of falling: Secondary | ICD-10-CM | POA: Diagnosis not present

## 2015-07-30 DIAGNOSIS — M4806 Spinal stenosis, lumbar region: Secondary | ICD-10-CM | POA: Diagnosis not present

## 2015-07-30 DIAGNOSIS — E119 Type 2 diabetes mellitus without complications: Secondary | ICD-10-CM | POA: Diagnosis not present

## 2015-07-30 DIAGNOSIS — I2510 Atherosclerotic heart disease of native coronary artery without angina pectoris: Secondary | ICD-10-CM | POA: Diagnosis not present

## 2015-07-31 DIAGNOSIS — T8189XD Other complications of procedures, not elsewhere classified, subsequent encounter: Secondary | ICD-10-CM | POA: Diagnosis not present

## 2015-07-31 DIAGNOSIS — Z87891 Personal history of nicotine dependence: Secondary | ICD-10-CM | POA: Diagnosis not present

## 2015-07-31 DIAGNOSIS — M4806 Spinal stenosis, lumbar region: Secondary | ICD-10-CM | POA: Diagnosis not present

## 2015-07-31 DIAGNOSIS — E119 Type 2 diabetes mellitus without complications: Secondary | ICD-10-CM | POA: Diagnosis not present

## 2015-07-31 DIAGNOSIS — I10 Essential (primary) hypertension: Secondary | ICD-10-CM | POA: Diagnosis not present

## 2015-07-31 DIAGNOSIS — I2510 Atherosclerotic heart disease of native coronary artery without angina pectoris: Secondary | ICD-10-CM | POA: Diagnosis not present

## 2015-07-31 DIAGNOSIS — Z9981 Dependence on supplemental oxygen: Secondary | ICD-10-CM | POA: Diagnosis not present

## 2015-07-31 DIAGNOSIS — Z9181 History of falling: Secondary | ICD-10-CM | POA: Diagnosis not present

## 2015-07-31 DIAGNOSIS — J449 Chronic obstructive pulmonary disease, unspecified: Secondary | ICD-10-CM | POA: Diagnosis not present

## 2015-08-02 DIAGNOSIS — Z9981 Dependence on supplemental oxygen: Secondary | ICD-10-CM | POA: Diagnosis not present

## 2015-08-02 DIAGNOSIS — Z9181 History of falling: Secondary | ICD-10-CM | POA: Diagnosis not present

## 2015-08-02 DIAGNOSIS — Z87891 Personal history of nicotine dependence: Secondary | ICD-10-CM | POA: Diagnosis not present

## 2015-08-02 DIAGNOSIS — T8189XD Other complications of procedures, not elsewhere classified, subsequent encounter: Secondary | ICD-10-CM | POA: Diagnosis not present

## 2015-08-02 DIAGNOSIS — M4806 Spinal stenosis, lumbar region: Secondary | ICD-10-CM | POA: Diagnosis not present

## 2015-08-02 DIAGNOSIS — I10 Essential (primary) hypertension: Secondary | ICD-10-CM | POA: Diagnosis not present

## 2015-08-02 DIAGNOSIS — J449 Chronic obstructive pulmonary disease, unspecified: Secondary | ICD-10-CM | POA: Diagnosis not present

## 2015-08-02 DIAGNOSIS — E119 Type 2 diabetes mellitus without complications: Secondary | ICD-10-CM | POA: Diagnosis not present

## 2015-08-02 DIAGNOSIS — I2510 Atherosclerotic heart disease of native coronary artery without angina pectoris: Secondary | ICD-10-CM | POA: Diagnosis not present

## 2015-08-03 DIAGNOSIS — J449 Chronic obstructive pulmonary disease, unspecified: Secondary | ICD-10-CM | POA: Diagnosis not present

## 2015-08-06 DIAGNOSIS — T8189XD Other complications of procedures, not elsewhere classified, subsequent encounter: Secondary | ICD-10-CM | POA: Diagnosis not present

## 2015-08-06 DIAGNOSIS — I10 Essential (primary) hypertension: Secondary | ICD-10-CM | POA: Diagnosis not present

## 2015-08-06 DIAGNOSIS — E119 Type 2 diabetes mellitus without complications: Secondary | ICD-10-CM | POA: Diagnosis not present

## 2015-08-06 DIAGNOSIS — J449 Chronic obstructive pulmonary disease, unspecified: Secondary | ICD-10-CM | POA: Diagnosis not present

## 2015-08-06 DIAGNOSIS — Z87891 Personal history of nicotine dependence: Secondary | ICD-10-CM | POA: Diagnosis not present

## 2015-08-06 DIAGNOSIS — Z9981 Dependence on supplemental oxygen: Secondary | ICD-10-CM | POA: Diagnosis not present

## 2015-08-06 DIAGNOSIS — M4806 Spinal stenosis, lumbar region: Secondary | ICD-10-CM | POA: Diagnosis not present

## 2015-08-06 DIAGNOSIS — I2510 Atherosclerotic heart disease of native coronary artery without angina pectoris: Secondary | ICD-10-CM | POA: Diagnosis not present

## 2015-08-06 DIAGNOSIS — Z9181 History of falling: Secondary | ICD-10-CM | POA: Diagnosis not present

## 2015-08-21 DIAGNOSIS — E119 Type 2 diabetes mellitus without complications: Secondary | ICD-10-CM | POA: Diagnosis not present

## 2015-08-21 DIAGNOSIS — Z87891 Personal history of nicotine dependence: Secondary | ICD-10-CM | POA: Diagnosis not present

## 2015-08-21 DIAGNOSIS — M4806 Spinal stenosis, lumbar region: Secondary | ICD-10-CM | POA: Diagnosis not present

## 2015-08-21 DIAGNOSIS — T8189XD Other complications of procedures, not elsewhere classified, subsequent encounter: Secondary | ICD-10-CM | POA: Diagnosis not present

## 2015-08-21 DIAGNOSIS — I10 Essential (primary) hypertension: Secondary | ICD-10-CM | POA: Diagnosis not present

## 2015-08-21 DIAGNOSIS — Z9181 History of falling: Secondary | ICD-10-CM | POA: Diagnosis not present

## 2015-08-21 DIAGNOSIS — J449 Chronic obstructive pulmonary disease, unspecified: Secondary | ICD-10-CM | POA: Diagnosis not present

## 2015-08-21 DIAGNOSIS — I2510 Atherosclerotic heart disease of native coronary artery without angina pectoris: Secondary | ICD-10-CM | POA: Diagnosis not present

## 2015-08-21 DIAGNOSIS — Z9981 Dependence on supplemental oxygen: Secondary | ICD-10-CM | POA: Diagnosis not present

## 2015-08-23 DIAGNOSIS — Z9981 Dependence on supplemental oxygen: Secondary | ICD-10-CM | POA: Diagnosis not present

## 2015-08-23 DIAGNOSIS — J449 Chronic obstructive pulmonary disease, unspecified: Secondary | ICD-10-CM | POA: Diagnosis not present

## 2015-08-23 DIAGNOSIS — I10 Essential (primary) hypertension: Secondary | ICD-10-CM | POA: Diagnosis not present

## 2015-08-23 DIAGNOSIS — M4806 Spinal stenosis, lumbar region: Secondary | ICD-10-CM | POA: Diagnosis not present

## 2015-08-23 DIAGNOSIS — I2510 Atherosclerotic heart disease of native coronary artery without angina pectoris: Secondary | ICD-10-CM | POA: Diagnosis not present

## 2015-08-23 DIAGNOSIS — Z9181 History of falling: Secondary | ICD-10-CM | POA: Diagnosis not present

## 2015-08-23 DIAGNOSIS — C61 Malignant neoplasm of prostate: Secondary | ICD-10-CM | POA: Diagnosis not present

## 2015-08-23 DIAGNOSIS — M21372 Foot drop, left foot: Secondary | ICD-10-CM | POA: Diagnosis not present

## 2015-08-23 DIAGNOSIS — E119 Type 2 diabetes mellitus without complications: Secondary | ICD-10-CM | POA: Diagnosis not present

## 2015-08-23 DIAGNOSIS — T8189XD Other complications of procedures, not elsewhere classified, subsequent encounter: Secondary | ICD-10-CM | POA: Diagnosis not present

## 2015-08-23 DIAGNOSIS — Z87891 Personal history of nicotine dependence: Secondary | ICD-10-CM | POA: Diagnosis not present

## 2015-09-03 DIAGNOSIS — J449 Chronic obstructive pulmonary disease, unspecified: Secondary | ICD-10-CM | POA: Diagnosis not present

## 2015-09-06 DIAGNOSIS — S91302D Unspecified open wound, left foot, subsequent encounter: Secondary | ICD-10-CM | POA: Diagnosis not present

## 2015-09-22 DIAGNOSIS — I10 Essential (primary) hypertension: Secondary | ICD-10-CM | POA: Diagnosis not present

## 2015-09-22 DIAGNOSIS — C61 Malignant neoplasm of prostate: Secondary | ICD-10-CM | POA: Diagnosis not present

## 2015-09-22 DIAGNOSIS — E119 Type 2 diabetes mellitus without complications: Secondary | ICD-10-CM | POA: Diagnosis not present

## 2015-09-22 DIAGNOSIS — M21372 Foot drop, left foot: Secondary | ICD-10-CM | POA: Diagnosis not present

## 2015-10-03 DIAGNOSIS — J449 Chronic obstructive pulmonary disease, unspecified: Secondary | ICD-10-CM | POA: Diagnosis not present

## 2015-10-08 DIAGNOSIS — Z885 Allergy status to narcotic agent status: Secondary | ICD-10-CM | POA: Diagnosis not present

## 2015-10-08 DIAGNOSIS — Z87891 Personal history of nicotine dependence: Secondary | ICD-10-CM | POA: Diagnosis not present

## 2015-10-08 DIAGNOSIS — Z9889 Other specified postprocedural states: Secondary | ICD-10-CM | POA: Diagnosis not present

## 2015-10-08 DIAGNOSIS — Z7982 Long term (current) use of aspirin: Secondary | ICD-10-CM | POA: Diagnosis not present

## 2015-10-08 DIAGNOSIS — I10 Essential (primary) hypertension: Secondary | ICD-10-CM | POA: Diagnosis not present

## 2015-10-08 DIAGNOSIS — Z09 Encounter for follow-up examination after completed treatment for conditions other than malignant neoplasm: Secondary | ICD-10-CM | POA: Diagnosis not present

## 2015-10-08 DIAGNOSIS — M216X2 Other acquired deformities of left foot: Secondary | ICD-10-CM | POA: Diagnosis not present

## 2015-10-08 DIAGNOSIS — E119 Type 2 diabetes mellitus without complications: Secondary | ICD-10-CM | POA: Diagnosis not present

## 2015-10-08 DIAGNOSIS — Z7984 Long term (current) use of oral hypoglycemic drugs: Secondary | ICD-10-CM | POA: Diagnosis not present

## 2015-10-09 DIAGNOSIS — C61 Malignant neoplasm of prostate: Secondary | ICD-10-CM | POA: Diagnosis not present

## 2015-10-16 DIAGNOSIS — C61 Malignant neoplasm of prostate: Secondary | ICD-10-CM | POA: Diagnosis not present

## 2015-10-25 DIAGNOSIS — C61 Malignant neoplasm of prostate: Secondary | ICD-10-CM | POA: Diagnosis not present

## 2015-10-25 DIAGNOSIS — E782 Mixed hyperlipidemia: Secondary | ICD-10-CM | POA: Diagnosis not present

## 2015-10-25 DIAGNOSIS — E11621 Type 2 diabetes mellitus with foot ulcer: Secondary | ICD-10-CM | POA: Diagnosis not present

## 2015-10-25 DIAGNOSIS — M810 Age-related osteoporosis without current pathological fracture: Secondary | ICD-10-CM | POA: Diagnosis not present

## 2015-10-25 DIAGNOSIS — Z125 Encounter for screening for malignant neoplasm of prostate: Secondary | ICD-10-CM | POA: Diagnosis not present

## 2015-10-25 DIAGNOSIS — C7951 Secondary malignant neoplasm of bone: Secondary | ICD-10-CM | POA: Diagnosis not present

## 2015-11-01 DIAGNOSIS — E782 Mixed hyperlipidemia: Secondary | ICD-10-CM | POA: Diagnosis not present

## 2015-11-01 DIAGNOSIS — M810 Age-related osteoporosis without current pathological fracture: Secondary | ICD-10-CM | POA: Diagnosis not present

## 2015-11-01 DIAGNOSIS — Z0000 Encounter for general adult medical examination without abnormal findings: Secondary | ICD-10-CM | POA: Diagnosis not present

## 2015-11-01 DIAGNOSIS — F1021 Alcohol dependence, in remission: Secondary | ICD-10-CM | POA: Diagnosis not present

## 2015-11-01 DIAGNOSIS — F39 Unspecified mood [affective] disorder: Secondary | ICD-10-CM | POA: Diagnosis not present

## 2015-11-01 DIAGNOSIS — R0902 Hypoxemia: Secondary | ICD-10-CM | POA: Diagnosis not present

## 2015-11-01 DIAGNOSIS — N183 Chronic kidney disease, stage 3 (moderate): Secondary | ICD-10-CM | POA: Diagnosis not present

## 2015-11-01 DIAGNOSIS — E11621 Type 2 diabetes mellitus with foot ulcer: Secondary | ICD-10-CM | POA: Diagnosis not present

## 2015-11-01 DIAGNOSIS — E1122 Type 2 diabetes mellitus with diabetic chronic kidney disease: Secondary | ICD-10-CM | POA: Diagnosis not present

## 2015-11-03 DIAGNOSIS — J449 Chronic obstructive pulmonary disease, unspecified: Secondary | ICD-10-CM | POA: Diagnosis not present

## 2015-11-15 ENCOUNTER — Telehealth: Payer: Self-pay | Admitting: Sports Medicine

## 2015-11-15 NOTE — Telephone Encounter (Signed)
Left voicemail for pt to call to schedule appt with Dr. Cannon Kettle

## 2015-11-21 ENCOUNTER — Encounter: Payer: Self-pay | Admitting: Sports Medicine

## 2015-11-21 ENCOUNTER — Ambulatory Visit (INDEPENDENT_AMBULATORY_CARE_PROVIDER_SITE_OTHER): Payer: Commercial Managed Care - HMO | Admitting: Sports Medicine

## 2015-11-21 DIAGNOSIS — B351 Tinea unguium: Secondary | ICD-10-CM | POA: Diagnosis not present

## 2015-11-21 DIAGNOSIS — E1142 Type 2 diabetes mellitus with diabetic polyneuropathy: Secondary | ICD-10-CM | POA: Diagnosis not present

## 2015-11-21 DIAGNOSIS — R29898 Other symptoms and signs involving the musculoskeletal system: Secondary | ICD-10-CM | POA: Insufficient documentation

## 2015-11-21 DIAGNOSIS — M48061 Spinal stenosis, lumbar region without neurogenic claudication: Secondary | ICD-10-CM | POA: Insufficient documentation

## 2015-11-21 DIAGNOSIS — M79676 Pain in unspecified toe(s): Secondary | ICD-10-CM

## 2015-11-21 NOTE — Progress Notes (Signed)
Patient ID: [name] [name], male   DOB: 1941-05-22, 75 y.o.   MRN: [id number] Subjective: [name] [name] is a 75 y.o. male patient with history of type 2 diabetes who presents to office today complaining of long, painful nails  while ambulating in shoes; unable to trim. Patient states that the glucose reading this morning was not recorded. States that he is "borderline". Patient denies any new changes in medication or new problems. Patient denies any new cramping, numbness, burning or tingling in the legs.  Patient Active Problem List   Diagnosis Date Noted  . Leg weakness 11/21/2015  . Lumbar canal stenosis 11/21/2015  . Open wound of heel 02/15/2015  . History of surgical procedure 11/16/2014  . Chronic obstructive pulmonary disease ([place]) 10/31/2014  . Type 2 diabetes mellitus ([place]) 10/31/2014  . BP (high blood pressure) 10/31/2014  . Acquired cavovarus deformity of foot 04/19/2014  . Acquired equinus deformity of foot 04/19/2014  . Ankle contracture 03/28/2014  . Rhabdomyolysis 07/05/2013  . COPD exacerbation ([place]) 07/05/2013  . H/O prostate cancer 07/04/2013  . Diabetes mellitus ([place]) 07/04/2013  . HTN (hypertension) 07/04/2013  . History of tobacco abuse 07/04/2013  . Fall 07/04/2013  . Thigh pain, musculoskeletal 07/04/2013  . Hypoxia 07/04/2013  . HLD (hyperlipidemia) 07/04/2013  . Depression 07/04/2013  . Chronic kidney disease 07/04/2013   Current Outpatient Prescriptions on File Prior to Visit  Medication Sig Dispense Refill  . albuterol (PROVENTIL) (2.5 MG/3ML) 0.083% nebulizer solution Take 2.5 mg by nebulization every 6 (six) hours as needed for wheezing or shortness of breath.     Marland Kitchen aspirin 325 MG tablet Take 325 mg by mouth daily.    . budesonide-formoterol (SYMBICORT) 160-4.5 MCG/ACT inhaler Inhale 2 puffs into the lungs 2 (two) times daily. 1 Inhaler 2  . cholecalciferol (VITAMIN D) 1000 UNITS tablet Take 1,000 Units by mouth daily.    . diazepam (VALIUM) 5 MG tablet  5 mg every 8 (eight) hours as needed for anxiety.     . finasteride (PROSCAR) 5 MG tablet Take 5 mg by mouth daily.    Marland Kitchen FLUoxetine (PROZAC) 40 MG capsule Take 40 mg by mouth every morning.    Marland Kitchen glipiZIDE (GLUCOTROL) 10 MG tablet Take 10 mg by mouth 2 (two) times daily before a meal.    . hydrochlorothiazide (HYDRODIURIL) 25 MG tablet Take 25 mg by mouth every morning.    . hydroxypropyl methylcellulose (ISOPTO TEARS) 2.5 % ophthalmic solution Place 1 drop into both eyes as needed (for dryness).     Marland Kitchen lisinopril (PRINIVIL,ZESTRIL) 20 MG tablet Take 10 mg by mouth every morning.    . metFORMIN (GLUCOPHAGE) 1000 MG tablet Take 1,000 mg by mouth 2 (two) times daily with a meal.    . oxyCODONE-acetaminophen (PERCOCET/ROXICET) 5-325 MG per tablet Take one tablet by mouth every 4 hours as needed for severe pain. Caution for altered mental status 180 tablet 0  . predniSONE (DELTASONE) 10 MG tablet Take 5 tablets (50 mg total) by mouth daily with breakfast. Start 07/08/13.  Decrease by one tablet daily until gone (Patient not taking: Reported on 09/04/2014) 15 tablet 0  . tiotropium (SPIRIVA) 18 MCG inhalation capsule Place 18 mcg into inhaler and inhale daily.     No current facility-administered medications on file prior to visit.   Allergies  Allergen Reactions  . Oxycodone Other (See Comments)    Objective: General: Patient is awake, alert, and oriented x 3 and in no acute distress.  Integument:  Skin is warm, dry and supple bilateral. Nails are tender, long, thickened and  dystrophic with subungual debris, consistent with onychomycosis, 1-5 bilateral. No signs of infection. No open lesions or preulcerative lesions present bilateral. Old scar left foot from previous surgery. Remaining integument unremarkable.  Vasculature:  Dorsalis Pedis pulse 1/4 bilateral. Posterior Tibial pulse  1/4 bilateral.  Capillary fill time <3 sec 1-5 bilateral. Scant hair growth to the level of the digits. Temperature  gradient within normal limits. No varicosities present bilateral. No edema present bilateral.   Neurology: The patient has absent sensation measured with a 5.07/10g Semmes Weinstein Monofilament at all pedal sites bilateral . Vibratory sensation diminished to level of ankles bilateral with tuning fork. No Babinski sign present bilateral.   Musculoskeletal: Right hammertoe pedal deformities noted with spasticity . Muscular strength 5/5 in all lower extremity muscular groups bilateral without pain or limitation on range of motion . No tenderness with calf compression bilateral.  Assessment and Plan: Problem List Items Addressed This Visit    None    Visit Diagnoses    Pain due to onychomycosis of toenail    -  Primary    Diabetic polyneuropathy associated with type 2 diabetes mellitus ([place])           -Examined patient. -Discussed and educated patient on diabetic foot care, especially with  regards to the vascular, neurological and musculoskeletal systems.  -Stressed the importance of good glycemic control and the detriment of not  controlling glucose levels in relation to the foot. -Mechanically debrided all nails 1-5 bilateral using sterile nail nipper and filed with dremel without incident  -Answered all patient questions -Patient to return in 3 months for at risk foot care -Patient advised to call the office if any problems or questions arise in the meantime.  [name] [name], DPM

## 2015-11-21 NOTE — Progress Notes (Deleted)
   Subjective:    Patient ID: [name] [name], male    DOB: [birth year], 75 y.o.   MRN: [id number]  HPI    Review of Systems  All other systems reviewed and are negative.      Objective:   Physical Exam        Assessment & Plan:

## 2015-11-21 NOTE — Patient Instructions (Signed)
Diabetes and Foot Care Diabetes may cause you to have problems because of poor blood supply (circulation) to your feet and legs. This may cause the skin on your feet to become thinner, break easier, and heal more slowly. Your skin may become dry, and the skin may peel and crack. You may also have nerve damage in your legs and feet causing decreased feeling in them. You may not notice minor injuries to your feet that could lead to infections or more serious problems. Taking care of your feet is one of the most important things you can do for yourself.  HOME CARE INSTRUCTIONS  Wear shoes at all times, even in the house. Do not go barefoot. Bare feet are easily injured.  Check your feet daily for blisters, cuts, and redness. If you cannot see the bottom of your feet, use a mirror or ask someone for help.  Wash your feet with warm water (do not use hot water) and mild soap. Then pat your feet and the areas between your toes until they are completely dry. Do not soak your feet as this can dry your skin.  Apply a moisturizing lotion or petroleum jelly (that does not contain alcohol and is unscented) to the skin on your feet and to dry, brittle toenails. Do not apply lotion between your toes.  Trim your toenails straight across. Do not dig under them or around the cuticle. File the edges of your nails with an emery board or nail file.  Do not cut corns or calluses or try to remove them with medicine.  Wear clean socks or stockings every day. Make sure they are not too tight. Do not wear knee-high stockings since they may decrease blood flow to your legs.  Wear shoes that fit properly and have enough cushioning. To break in new shoes, wear them for just a few hours a day. This prevents you from injuring your feet. Always look in your shoes before you put them on to be sure there are no objects inside.  Do not cross your legs. This may decrease the blood flow to your feet.  If you find a minor scrape,  cut, or break in the skin on your feet, keep it and the skin around it clean and dry. These areas may be cleansed with mild soap and water. Do not cleanse the area with peroxide, alcohol, or iodine.  When you remove an adhesive bandage, be sure not to damage the skin around it.  If you have a wound, look at it several times a day to make sure it is healing.  Do not use heating pads or hot water bottles. They may burn your skin. If you have lost feeling in your feet or legs, you may not know it is happening until it is too late.  Make sure your health care provider performs a complete foot exam at least annually or more often if you have foot problems. Report any cuts, sores, or bruises to your health care provider immediately. SEEK MEDICAL CARE IF:   You have an injury that is not healing.  You have cuts or breaks in the skin.  You have an ingrown nail.  You notice redness on your legs or feet.  You feel burning or tingling in your legs or feet.  You have pain or cramps in your legs and feet.  Your legs or feet are numb.  Your feet always feel cold. SEEK IMMEDIATE MEDICAL CARE IF:   There is increasing redness,   swelling, or pain in or around a wound.  There is a red line that goes up your leg.  Pus is coming from a wound.  You develop a fever or as directed by your health care provider.  You notice a bad smell coming from an ulcer or wound.   This information is not intended to replace advice given to you by your health care provider. Make sure you discuss any questions you have with your health care provider.   Document Released: 09/18/2000 Document Revised: 05/24/2013 Document Reviewed: 02/28/2013 Elsevier Interactive Patient Education 2016 [employer]  

## 2015-12-03 DIAGNOSIS — J449 Chronic obstructive pulmonary disease, unspecified: Secondary | ICD-10-CM | POA: Diagnosis not present

## 2016-01-01 DIAGNOSIS — J449 Chronic obstructive pulmonary disease, unspecified: Secondary | ICD-10-CM | POA: Diagnosis not present

## 2016-01-06 DIAGNOSIS — L0390 Cellulitis, unspecified: Secondary | ICD-10-CM | POA: Diagnosis not present

## 2016-02-01 DIAGNOSIS — J449 Chronic obstructive pulmonary disease, unspecified: Secondary | ICD-10-CM | POA: Diagnosis not present

## 2016-02-03 DIAGNOSIS — C61 Malignant neoplasm of prostate: Secondary | ICD-10-CM | POA: Diagnosis not present

## 2016-02-03 DIAGNOSIS — R591 Generalized enlarged lymph nodes: Secondary | ICD-10-CM | POA: Diagnosis not present

## 2016-02-03 DIAGNOSIS — Z0000 Encounter for general adult medical examination without abnormal findings: Secondary | ICD-10-CM | POA: Diagnosis not present

## 2016-02-10 DIAGNOSIS — N183 Chronic kidney disease, stage 3 (moderate): Secondary | ICD-10-CM | POA: Diagnosis not present

## 2016-02-10 DIAGNOSIS — Z6829 Body mass index (BMI) 29.0-29.9, adult: Secondary | ICD-10-CM | POA: Diagnosis not present

## 2016-02-10 DIAGNOSIS — J449 Chronic obstructive pulmonary disease, unspecified: Secondary | ICD-10-CM | POA: Diagnosis not present

## 2016-02-10 DIAGNOSIS — E1122 Type 2 diabetes mellitus with diabetic chronic kidney disease: Secondary | ICD-10-CM | POA: Diagnosis not present

## 2016-02-10 DIAGNOSIS — M810 Age-related osteoporosis without current pathological fracture: Secondary | ICD-10-CM | POA: Diagnosis not present

## 2016-02-10 DIAGNOSIS — I10 Essential (primary) hypertension: Secondary | ICD-10-CM | POA: Diagnosis not present

## 2016-02-10 DIAGNOSIS — F39 Unspecified mood [affective] disorder: Secondary | ICD-10-CM | POA: Diagnosis not present

## 2016-02-10 DIAGNOSIS — C61 Malignant neoplasm of prostate: Secondary | ICD-10-CM | POA: Diagnosis not present

## 2016-02-10 DIAGNOSIS — E782 Mixed hyperlipidemia: Secondary | ICD-10-CM | POA: Diagnosis not present

## 2016-02-20 ENCOUNTER — Encounter: Payer: Self-pay | Admitting: Sports Medicine

## 2016-02-20 ENCOUNTER — Ambulatory Visit (INDEPENDENT_AMBULATORY_CARE_PROVIDER_SITE_OTHER): Payer: Commercial Managed Care - HMO | Admitting: Sports Medicine

## 2016-02-20 ENCOUNTER — Ambulatory Visit: Payer: Commercial Managed Care - HMO | Admitting: Sports Medicine

## 2016-02-20 DIAGNOSIS — M2030 Hallux varus (acquired), unspecified foot: Secondary | ICD-10-CM

## 2016-02-20 DIAGNOSIS — M24572 Contracture, left ankle: Secondary | ICD-10-CM

## 2016-02-20 DIAGNOSIS — M2040 Other hammer toe(s) (acquired), unspecified foot: Secondary | ICD-10-CM

## 2016-02-20 DIAGNOSIS — E1142 Type 2 diabetes mellitus with diabetic polyneuropathy: Secondary | ICD-10-CM | POA: Diagnosis not present

## 2016-02-20 DIAGNOSIS — B351 Tinea unguium: Secondary | ICD-10-CM

## 2016-02-20 DIAGNOSIS — M79676 Pain in unspecified toe(s): Secondary | ICD-10-CM | POA: Diagnosis not present

## 2016-02-20 DIAGNOSIS — M24575 Contracture, left foot: Secondary | ICD-10-CM

## 2016-02-20 NOTE — Patient Instructions (Signed)
Vinegar Soaks 1 cup of white distill vinegar to 8 cups of warm water. Soak for 15 mins every other week (total twice per month)

## 2016-02-20 NOTE — Progress Notes (Signed)
Patient ID: SEAGER SANDBERG, male   DOB: 1941/08/09, 75 y.o.   MRN: [id number]   Subjective: [name] [name] is a 75 y.o. male patient with history of type 2 diabetes who presents to office today complaining of long, painful nails  while ambulating in shoes; unable to trim. Patient states that the glucose reading this morning was not recorded. States that his A1c <6. Patient denies any new changes in medication or new problems. Patient denies any new cramping, numbness, burning or tingling in the legs.  Patient Active Problem List   Diagnosis Date Noted  . Leg weakness 11/21/2015  . Lumbar canal stenosis 11/21/2015  . Open wound of heel 02/15/2015  . History of surgical procedure 11/16/2014  . Chronic obstructive pulmonary disease ([place]) 10/31/2014  . Type 2 diabetes mellitus ([place]) 10/31/2014  . BP (high blood pressure) 10/31/2014  . Acquired cavovarus deformity of foot 04/19/2014  . Acquired equinus deformity of foot 04/19/2014  . Ankle contracture 03/28/2014  . Rhabdomyolysis 07/05/2013  . COPD exacerbation ([place]) 07/05/2013  . H/O prostate cancer 07/04/2013  . Diabetes mellitus ([place]) 07/04/2013  . HTN (hypertension) 07/04/2013  . History of tobacco abuse 07/04/2013  . Fall 07/04/2013  . Thigh pain, musculoskeletal 07/04/2013  . Hypoxia 07/04/2013  . HLD (hyperlipidemia) 07/04/2013  . Depression 07/04/2013  . Chronic kidney disease 07/04/2013   Current Outpatient Prescriptions on File Prior to Visit  Medication Sig Dispense Refill  . albuterol (PROVENTIL) (2.5 MG/3ML) 0.083% nebulizer solution Take 2.5 mg by nebulization every 6 (six) hours as needed for wheezing or shortness of breath.     Marland Kitchen aspirin 325 MG tablet Take 325 mg by mouth daily.    . budesonide-formoterol (SYMBICORT) 160-4.5 MCG/ACT inhaler Inhale 2 puffs into the lungs 2 (two) times daily. 1 Inhaler 2  . cholecalciferol (VITAMIN D) 1000 UNITS tablet Take 1,000 Units by mouth daily.    . diazepam (VALIUM) 5 MG tablet 5 mg  every 8 (eight) hours as needed for anxiety.     . finasteride (PROSCAR) 5 MG tablet Take 5 mg by mouth daily.    Marland Kitchen FLUoxetine (PROZAC) 40 MG capsule Take 40 mg by mouth every morning.    Marland Kitchen glipiZIDE (GLUCOTROL) 10 MG tablet Take 10 mg by mouth 2 (two) times daily before a meal.    . hydrochlorothiazide (HYDRODIURIL) 25 MG tablet Take 25 mg by mouth every morning.    . hydroxypropyl methylcellulose (ISOPTO TEARS) 2.5 % ophthalmic solution Place 1 drop into both eyes as needed (for dryness).     Marland Kitchen lisinopril (PRINIVIL,ZESTRIL) 20 MG tablet Take 10 mg by mouth every morning.    . metFORMIN (GLUCOPHAGE) 1000 MG tablet Take 1,000 mg by mouth 2 (two) times daily with a meal.    . oxyCODONE-acetaminophen (PERCOCET/ROXICET) 5-325 MG per tablet Take one tablet by mouth every 4 hours as needed for severe pain. Caution for altered mental status 180 tablet 0  . predniSONE (DELTASONE) 10 MG tablet Take 5 tablets (50 mg total) by mouth daily with breakfast. Start 07/08/13.  Decrease by one tablet daily until gone (Patient not taking: Reported on 09/04/2014) 15 tablet 0  . tiotropium (SPIRIVA) 18 MCG inhalation capsule Place 18 mcg into inhaler and inhale daily.     No current facility-administered medications on file prior to visit.   Allergies  Allergen Reactions  . Oxycodone Other (See Comments)    Objective: General: Patient is awake, alert, and oriented x 3 and in no acute distress.  Integument: Skin is warm, dry and supple bilateral. Nails are tender, long, thickened and  dystrophic with subungual debris, consistent with onychomycosis, 1-5 bilateral. No signs of infection. No open lesions or preulcerative lesions present bilateral. Old scar left foot from previous surgery. Remaining integument unremarkable.  Vasculature:  Dorsalis Pedis pulse 1/4 bilateral. Posterior Tibial pulse  1/4 bilateral.  Capillary fill time <3 sec 1-5 bilateral. Scant hair growth to the level of the digits. Temperature  gradient within normal limits. No varicosities present bilateral. No edema present bilateral.   Neurology: The patient has absent sensation measured with a 5.07/10g Semmes Weinstein Monofilament at all pedal sites bilateral. Vibratory sensation diminished to level of ankles bilateral with tuning fork. No Babinski sign present bilateral.   Musculoskeletal: Right hammertoe pedal deformities noted with spasticity. Muscular strength 5/5 in all lower extremity muscular groups bilateral without pain or limitation on range of motion except at left ankle due to previous injury/frame. No tenderness with calf compression bilateral.  Assessment and Plan: Problem List Items Addressed This Visit    None    Visit Diagnoses    Pain due to onychomycosis of toenail    -  Primary    Diabetic polyneuropathy associated with type 2 diabetes mellitus (HCC)        Contracture of ankle and foot joint, left        Hallux hammertoe, unspecified laterality          -Examined patient. -Discussed and educated patient on diabetic foot care, especially with  regards to the vascular, neurological and musculoskeletal systems.  -Stressed the importance of good glycemic control and the detriment of not  controlling glucose levels in relation to the foot. -Mechanically debrided all nails 1-5 bilateral using sterile nail nipper and filed with dremel without incident  -Answered all patient questions -Patient to return in 3 months for at risk foot care. Will perform fungal culture of nails at next visit, patient is interested in possible treatment options for mycotic nails. -Patient advised to call the office if any problems or questions arise in the meantime.  [name], DPM

## 2016-03-02 DIAGNOSIS — J449 Chronic obstructive pulmonary disease, unspecified: Secondary | ICD-10-CM | POA: Diagnosis not present

## 2016-04-02 DIAGNOSIS — J449 Chronic obstructive pulmonary disease, unspecified: Secondary | ICD-10-CM | POA: Diagnosis not present

## 2016-04-22 DIAGNOSIS — C61 Malignant neoplasm of prostate: Secondary | ICD-10-CM | POA: Diagnosis not present

## 2016-04-29 DIAGNOSIS — C61 Malignant neoplasm of prostate: Secondary | ICD-10-CM | POA: Diagnosis not present

## 2016-04-29 DIAGNOSIS — R9721 Rising PSA following treatment for malignant neoplasm of prostate: Secondary | ICD-10-CM | POA: Diagnosis not present

## 2016-04-29 DIAGNOSIS — E291 Testicular hypofunction: Secondary | ICD-10-CM | POA: Diagnosis not present

## 2016-04-29 DIAGNOSIS — N5201 Erectile dysfunction due to arterial insufficiency: Secondary | ICD-10-CM | POA: Diagnosis not present

## 2016-05-02 DIAGNOSIS — J449 Chronic obstructive pulmonary disease, unspecified: Secondary | ICD-10-CM | POA: Diagnosis not present

## 2016-05-27 ENCOUNTER — Encounter (INDEPENDENT_AMBULATORY_CARE_PROVIDER_SITE_OTHER): Payer: Self-pay

## 2016-05-27 ENCOUNTER — Ambulatory Visit (INDEPENDENT_AMBULATORY_CARE_PROVIDER_SITE_OTHER): Payer: Commercial Managed Care - HMO | Admitting: Sports Medicine

## 2016-05-27 ENCOUNTER — Encounter: Payer: Self-pay | Admitting: Sports Medicine

## 2016-05-27 DIAGNOSIS — B351 Tinea unguium: Secondary | ICD-10-CM | POA: Diagnosis not present

## 2016-05-27 DIAGNOSIS — M79676 Pain in unspecified toe(s): Secondary | ICD-10-CM | POA: Diagnosis not present

## 2016-05-27 DIAGNOSIS — E1142 Type 2 diabetes mellitus with diabetic polyneuropathy: Secondary | ICD-10-CM

## 2016-05-27 DIAGNOSIS — M2040 Other hammer toe(s) (acquired), unspecified foot: Secondary | ICD-10-CM

## 2016-05-27 DIAGNOSIS — M2030 Hallux varus (acquired), unspecified foot: Secondary | ICD-10-CM

## 2016-05-27 DIAGNOSIS — M24575 Contracture, left foot: Secondary | ICD-10-CM

## 2016-05-27 DIAGNOSIS — M24572 Contracture, left ankle: Secondary | ICD-10-CM

## 2016-05-27 NOTE — Progress Notes (Signed)
Patient ID: MIKAIL SCHWANTZ, male   DOB: 1940-11-05, 75 y.o.   MRN: [id number]   Subjective: PINCHUS DISCHLER is a 75 y.o. male patient with history of type 2 diabetes who presents to office today complaining of long, painful nails and for fungal culture to be sent to lab. Admits Long nails cause pain while ambulating in shoes; unable to trim. Patient states that the glucose reading this morning was not recorded; but stays about the same. Patient denies any new changes in medication or new problems. Patient denies any new cramping, numbness, burning or tingling in the legs.  Patient Active Problem List   Diagnosis Date Noted  . Leg weakness 11/21/2015  . Lumbar canal stenosis 11/21/2015  . Open wound of heel 02/15/2015  . History of surgical procedure 11/16/2014  . Chronic obstructive pulmonary disease ([place]) 10/31/2014  . Type 2 diabetes mellitus ([place]) 10/31/2014  . BP (high blood pressure) 10/31/2014  . Acquired cavovarus deformity of foot 04/19/2014  . Acquired equinus deformity of foot 04/19/2014  . Ankle contracture 03/28/2014  . Rhabdomyolysis 07/05/2013  . COPD exacerbation ([place]) 07/05/2013  . H/O prostate cancer 07/04/2013  . Diabetes mellitus ([place]) 07/04/2013  . HTN (hypertension) 07/04/2013  . History of tobacco abuse 07/04/2013  . Fall 07/04/2013  . Thigh pain, musculoskeletal 07/04/2013  . Hypoxia 07/04/2013  . HLD (hyperlipidemia) 07/04/2013  . Depression 07/04/2013  . Chronic kidney disease 07/04/2013   Current Outpatient Prescriptions on File Prior to Visit  Medication Sig Dispense Refill  . albuterol (PROVENTIL) (2.5 MG/3ML) 0.083% nebulizer solution Take 2.5 mg by nebulization every 6 (six) hours as needed for wheezing or shortness of breath.     Marland Kitchen aspirin 325 MG tablet Take 325 mg by mouth daily.    . budesonide-formoterol (SYMBICORT) 160-4.5 MCG/ACT inhaler Inhale 2 puffs into the lungs 2 (two) times daily. 1 Inhaler 2  . cholecalciferol (VITAMIN D) 1000 UNITS tablet  Take 1,000 Units by mouth daily.    . diazepam (VALIUM) 5 MG tablet 5 mg every 8 (eight) hours as needed for anxiety.     . finasteride (PROSCAR) 5 MG tablet Take 5 mg by mouth daily.    Marland Kitchen FLUoxetine (PROZAC) 40 MG capsule Take 40 mg by mouth every morning.    Marland Kitchen glipiZIDE (GLUCOTROL) 10 MG tablet Take 10 mg by mouth 2 (two) times daily before a meal.    . hydrochlorothiazide (HYDRODIURIL) 25 MG tablet Take 25 mg by mouth every morning.    . hydroxypropyl methylcellulose (ISOPTO TEARS) 2.5 % ophthalmic solution Place 1 drop into both eyes as needed (for dryness).     Marland Kitchen lisinopril (PRINIVIL,ZESTRIL) 20 MG tablet Take 10 mg by mouth every morning.    . metFORMIN (GLUCOPHAGE) 1000 MG tablet Take 1,000 mg by mouth 2 (two) times daily with a meal.    . oxyCODONE-acetaminophen (PERCOCET/ROXICET) 5-325 MG per tablet Take one tablet by mouth every 4 hours as needed for severe pain. Caution for altered mental status 180 tablet 0  . predniSONE (DELTASONE) 10 MG tablet Take 5 tablets (50 mg total) by mouth daily with breakfast. Start 07/08/13.  Decrease by one tablet daily until gone (Patient not taking: Reported on 09/04/2014) 15 tablet 0  . tiotropium (SPIRIVA) 18 MCG inhalation capsule Place 18 mcg into inhaler and inhale daily.     No current facility-administered medications on file prior to visit.    Allergies  Allergen Reactions  . Oxycodone Other (See Comments)    Objective:  General: Patient is awake, alert, and oriented x 3 and in no acute distress.  Integument: Skin is warm, dry and supple bilateral. Nails are tender, long, thickened and dystrophic with subungual debris, consistent with onychomycosis, 1-5 bilateral. No signs of infection. No open lesions or preulcerative lesions present bilateral. Old scar left foot from previous surgery. Remaining integument unremarkable.  Vasculature:  Dorsalis Pedis pulse 1/4 bilateral. Posterior Tibial pulse  1/4 bilateral.  Capillary fill time <3 sec 1-5  bilateral. Scant hair growth to the level of the digits. Temperature gradient within normal limits. No varicosities present bilateral. No edema present bilateral.   Neurology: The patient has absent sensation measured with a 5.07/10g Semmes Weinstein Monofilament at all pedal sites bilateral. Vibratory sensation diminished to level of ankles bilateral with tuning fork. No Babinski sign present bilateral.   Musculoskeletal: Right hammertoe pedal deformities noted with spasticity. Muscular strength 4/5 right, 5/5 left in all lower extremity muscular groups bilateral without pain or limitation on range of motion except at left ankle due to previous injury. No tenderness with calf compression bilateral.  Assessment and Plan: Problem List Items Addressed This Visit    None    Visit Diagnoses    Pain due to onychomycosis of toenail    -  Primary   Diabetic polyneuropathy associated with type 2 diabetes mellitus (HCC)       Contracture of ankle and foot joint, left       Hallux hammertoe, unspecified laterality         -Examined patient. -Discussed and educated patient on diabetic foot care, especially with  regards to the vascular, neurological and musculoskeletal systems.  -Stressed the importance of good glycemic control and the detriment of not  controlling glucose levels in relation to the foot. -Mechanically debrided all nails 1-5 bilateral using sterile nail nipper and filed with dremel without incident and SENT TO [facility] FOR FUNGAL CULTURE TESTS -Answered all patient questions -Patient to return in 1 Month to discuss fungal culture results -Patient advised to call the office if any problems or questions arise in the meantime.  [name], DPM

## 2016-06-02 DIAGNOSIS — J449 Chronic obstructive pulmonary disease, unspecified: Secondary | ICD-10-CM | POA: Diagnosis not present

## 2016-06-26 ENCOUNTER — Telehealth: Payer: Self-pay | Admitting: Sports Medicine

## 2016-06-26 NOTE — Telephone Encounter (Signed)
Patient saw Dr. Cannon Kettle on August 23 and a pathology was done. Patient was under impression he would be called with results. Patient's wife called saying no one has called and she would like to know what is going on.

## 2016-06-26 NOTE — Telephone Encounter (Signed)
Unable to leave a message on wife's phone, phone would ring, then line would pick up and hang up without answering service, I attempted the call 3 times. I called pt on the mobile line and he gave the phone to his wife and I explained Dr. Cannon Kettle required an appt to discuss results, transferred to schedulers.

## 2016-07-03 DIAGNOSIS — J449 Chronic obstructive pulmonary disease, unspecified: Secondary | ICD-10-CM | POA: Diagnosis not present

## 2016-07-15 ENCOUNTER — Ambulatory Visit (INDEPENDENT_AMBULATORY_CARE_PROVIDER_SITE_OTHER): Payer: Commercial Managed Care - HMO | Admitting: Sports Medicine

## 2016-07-15 ENCOUNTER — Encounter: Payer: Self-pay | Admitting: Sports Medicine

## 2016-07-15 DIAGNOSIS — M2030 Hallux varus (acquired), unspecified foot: Secondary | ICD-10-CM | POA: Diagnosis not present

## 2016-07-15 DIAGNOSIS — L84 Corns and callosities: Secondary | ICD-10-CM | POA: Diagnosis not present

## 2016-07-15 DIAGNOSIS — M24572 Contracture, left ankle: Secondary | ICD-10-CM | POA: Diagnosis not present

## 2016-07-15 DIAGNOSIS — M24575 Contracture, left foot: Secondary | ICD-10-CM | POA: Diagnosis not present

## 2016-07-15 DIAGNOSIS — B351 Tinea unguium: Secondary | ICD-10-CM

## 2016-07-15 DIAGNOSIS — E1142 Type 2 diabetes mellitus with diabetic polyneuropathy: Secondary | ICD-10-CM

## 2016-07-15 NOTE — Progress Notes (Signed)
Patient ID: DARINEL NOH, male   DOB: 10-Nov-1940, 75 y.o.   MRN: [id number]   Subjective: [name] [name] is a 75 y.o. male patient with history of type 2 diabetes who returns to office for discussion of fungal culture results  FBS not recorded.  Patient Active Problem List   Diagnosis Date Noted  . Leg weakness 11/21/2015  . Lumbar canal stenosis 11/21/2015  . Open wound of heel 02/15/2015  . History of surgical procedure 11/16/2014  . Chronic obstructive pulmonary disease ([place]) 10/31/2014  . Type 2 diabetes mellitus ([place]) 10/31/2014  . BP (high blood pressure) 10/31/2014  . Acquired cavovarus deformity of foot 04/19/2014  . Acquired equinus deformity of foot 04/19/2014  . Ankle contracture 03/28/2014  . Rhabdomyolysis 07/05/2013  . COPD exacerbation ([place]) 07/05/2013  . H/O prostate cancer 07/04/2013  . Diabetes mellitus ([place]) 07/04/2013  . HTN (hypertension) 07/04/2013  . History of tobacco abuse 07/04/2013  . Fall 07/04/2013  . Thigh pain, musculoskeletal 07/04/2013  . Hypoxia 07/04/2013  . HLD (hyperlipidemia) 07/04/2013  . Depression 07/04/2013  . Chronic kidney disease 07/04/2013   Current Outpatient Prescriptions on File Prior to Visit  Medication Sig Dispense Refill  . albuterol (PROVENTIL) (2.5 MG/3ML) 0.083% nebulizer solution Take 2.5 mg by nebulization every 6 (six) hours as needed for wheezing or shortness of breath.     Marland Kitchen aspirin 325 MG tablet Take 325 mg by mouth daily.    . budesonide-formoterol (SYMBICORT) 160-4.5 MCG/ACT inhaler Inhale 2 puffs into the lungs 2 (two) times daily. 1 Inhaler 2  . cholecalciferol (VITAMIN D) 1000 UNITS tablet Take 1,000 Units by mouth daily.    . diazepam (VALIUM) 5 MG tablet 5 mg every 8 (eight) hours as needed for anxiety.     . finasteride (PROSCAR) 5 MG tablet Take 5 mg by mouth daily.    Marland Kitchen FLUoxetine (PROZAC) 40 MG capsule Take 40 mg by mouth every morning.    Marland Kitchen glipiZIDE (GLUCOTROL) 10 MG tablet Take 10 mg by mouth 2 (two)  times daily before a meal.    . hydrochlorothiazide (HYDRODIURIL) 25 MG tablet Take 25 mg by mouth every morning.    . hydroxypropyl methylcellulose (ISOPTO TEARS) 2.5 % ophthalmic solution Place 1 drop into both eyes as needed (for dryness).     Marland Kitchen lisinopril (PRINIVIL,ZESTRIL) 20 MG tablet Take 10 mg by mouth every morning.    . metFORMIN (GLUCOPHAGE) 1000 MG tablet Take 1,000 mg by mouth 2 (two) times daily with a meal.    . oxyCODONE-acetaminophen (PERCOCET/ROXICET) 5-325 MG per tablet Take one tablet by mouth every 4 hours as needed for severe pain. Caution for altered mental status 180 tablet 0  . predniSONE (DELTASONE) 10 MG tablet Take 5 tablets (50 mg total) by mouth daily with breakfast. Start 07/08/13.  Decrease by one tablet daily until gone (Patient not taking: Reported on 09/04/2014) 15 tablet 0  . tiotropium (SPIRIVA) 18 MCG inhalation capsule Place 18 mcg into inhaler and inhale daily.     No current facility-administered medications on file prior to visit.    Allergies  Allergen Reactions  . Oxycodone Other (See Comments)    Objective: General: Patient is awake, alert, and oriented x 3 and in no acute distress.  Integument: Skin is warm, dry and supple bilateral. Nails are tender, short, thickened and dystrophic with subungual debris, consistent with onychomycosis, 1-5 bilateral. No signs of infection. Right 2nd toe distal tuft preulcerative lesion; no opening.  Old scar left  foot from previous surgery. Remaining integument unremarkable.  Vasculature:  Dorsalis Pedis pulse 1/4 bilateral. Posterior Tibial pulse  1/4 bilateral.  Capillary fill time <3 sec 1-5 bilateral. Scant hair growth to the level of the digits. Temperature gradient within normal limits. No varicosities present bilateral. No edema present bilateral.   Neurology: The patient has absent sensation measured with a 5.07/10g Semmes Weinstein Monofilament at all pedal sites bilateral. Vibratory sensation diminished  to level of ankles bilateral with tuning fork. No Babinski sign present bilateral.   Musculoskeletal: Right hallux and lesser hammertoe pedal deformities noted with spasticity. Muscular strength 4/5 right, 5/5 left in all lower extremity muscular groups bilateral without pain or limitation on range of motion except at left ankle due to previous injury. No tenderness with calf compression bilateral.  Assessment and Plan: Problem List Items Addressed This Visit    None    Visit Diagnoses    Nail fungus    -  Primary   Relevant Orders   Hepatic Function Panel   Hallux hammertoe, unspecified laterality       Contracture of ankle and foot joint, left       Pre-ulcerative calluses       Diabetic polyneuropathy associated with type 2 diabetes mellitus ([place])         -Examined patient. -Discussed treatment options for nail fungus -Patient opt to consider PO lamisil; Order Hepatic panel will call with results; If normal will start PO Lamisil x 12 weeks -Recommend good hygiene habits -Recommend good supportive shoes for foot type -Patient advised to call the office if any problems or questions arise in the meantime. Return in 6 weeks in anticipation of starting lamisil for recheck of nails.   [name], DPM

## 2016-07-23 DIAGNOSIS — I10 Essential (primary) hypertension: Secondary | ICD-10-CM | POA: Diagnosis not present

## 2016-07-23 DIAGNOSIS — J449 Chronic obstructive pulmonary disease, unspecified: Secondary | ICD-10-CM | POA: Diagnosis not present

## 2016-07-23 DIAGNOSIS — M21372 Foot drop, left foot: Secondary | ICD-10-CM | POA: Diagnosis not present

## 2016-07-23 DIAGNOSIS — C61 Malignant neoplasm of prostate: Secondary | ICD-10-CM | POA: Diagnosis not present

## 2016-08-02 DIAGNOSIS — J449 Chronic obstructive pulmonary disease, unspecified: Secondary | ICD-10-CM | POA: Diagnosis not present

## 2016-08-23 DIAGNOSIS — C61 Malignant neoplasm of prostate: Secondary | ICD-10-CM | POA: Diagnosis not present

## 2016-08-23 DIAGNOSIS — M21372 Foot drop, left foot: Secondary | ICD-10-CM | POA: Diagnosis not present

## 2016-08-23 DIAGNOSIS — J449 Chronic obstructive pulmonary disease, unspecified: Secondary | ICD-10-CM | POA: Diagnosis not present

## 2016-08-23 DIAGNOSIS — I10 Essential (primary) hypertension: Secondary | ICD-10-CM | POA: Diagnosis not present

## 2016-08-25 DIAGNOSIS — C61 Malignant neoplasm of prostate: Secondary | ICD-10-CM | POA: Diagnosis not present

## 2016-08-29 DIAGNOSIS — T2029XA Burn of second degree of multiple sites of head, face, and neck, initial encounter: Secondary | ICD-10-CM | POA: Diagnosis not present

## 2016-08-31 ENCOUNTER — Emergency Department (HOSPITAL_COMMUNITY)
Admission: EM | Admit: 2016-08-31 | Discharge: 2016-08-31 | Disposition: A | Discharge status: Home / Self Care | Payer: Commercial Managed Care - HMO | Attending: Dermatology | Admitting: Dermatology

## 2016-08-31 ENCOUNTER — Encounter (HOSPITAL_COMMUNITY): Payer: Self-pay | Admitting: Emergency Medicine

## 2016-08-31 DIAGNOSIS — Y999 Unspecified external cause status: Secondary | ICD-10-CM | POA: Insufficient documentation

## 2016-08-31 DIAGNOSIS — Z5321 Procedure and treatment not carried out due to patient leaving prior to being seen by health care provider: Secondary | ICD-10-CM | POA: Diagnosis not present

## 2016-08-31 DIAGNOSIS — Y939 Activity, unspecified: Secondary | ICD-10-CM | POA: Insufficient documentation

## 2016-08-31 DIAGNOSIS — T2000XA Burn of unspecified degree of head, face, and neck, unspecified site, initial encounter: Secondary | ICD-10-CM | POA: Diagnosis not present

## 2016-08-31 DIAGNOSIS — X088XXA Exposure to other specified smoke, fire and flames, initial encounter: Secondary | ICD-10-CM | POA: Diagnosis not present

## 2016-08-31 DIAGNOSIS — Y92009 Unspecified place in unspecified non-institutional (private) residence as the place of occurrence of the external cause: Secondary | ICD-10-CM | POA: Insufficient documentation
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
## 2016-09-01 DIAGNOSIS — T2029XA Burn of second degree of multiple sites of head, face, and neck, initial encounter: Secondary | ICD-10-CM | POA: Diagnosis not present

## 2016-09-02 DIAGNOSIS — J449 Chronic obstructive pulmonary disease, unspecified: Secondary | ICD-10-CM | POA: Diagnosis not present

## 2016-09-07 DIAGNOSIS — Z6830 Body mass index (BMI) 30.0-30.9, adult: Secondary | ICD-10-CM | POA: Diagnosis not present

## 2016-09-07 DIAGNOSIS — T23202A Burn of second degree of left hand, unspecified site, initial encounter: Secondary | ICD-10-CM | POA: Diagnosis not present

## 2016-09-07 DIAGNOSIS — T2029XA Burn of second degree of multiple sites of head, face, and neck, initial encounter: Secondary | ICD-10-CM | POA: Diagnosis not present

## 2016-09-07 DIAGNOSIS — Z9981 Dependence on supplemental oxygen: Secondary | ICD-10-CM | POA: Diagnosis not present

## 2016-09-07 DIAGNOSIS — J449 Chronic obstructive pulmonary disease, unspecified: Secondary | ICD-10-CM | POA: Diagnosis not present

## 2016-09-07 DIAGNOSIS — T23201A Burn of second degree of right hand, unspecified site, initial encounter: Secondary | ICD-10-CM | POA: Diagnosis not present

## 2016-09-07 DIAGNOSIS — F17200 Nicotine dependence, unspecified, uncomplicated: Secondary | ICD-10-CM | POA: Diagnosis not present

## 2016-09-22 DIAGNOSIS — M21372 Foot drop, left foot: Secondary | ICD-10-CM | POA: Diagnosis not present

## 2016-09-22 DIAGNOSIS — I10 Essential (primary) hypertension: Secondary | ICD-10-CM | POA: Diagnosis not present

## 2016-09-22 DIAGNOSIS — C61 Malignant neoplasm of prostate: Secondary | ICD-10-CM | POA: Diagnosis not present

## 2016-09-22 DIAGNOSIS — J449 Chronic obstructive pulmonary disease, unspecified: Secondary | ICD-10-CM | POA: Diagnosis not present

## 2016-10-02 DIAGNOSIS — J449 Chronic obstructive pulmonary disease, unspecified: Secondary | ICD-10-CM | POA: Diagnosis not present

## 2016-10-23 DIAGNOSIS — C61 Malignant neoplasm of prostate: Secondary | ICD-10-CM | POA: Diagnosis not present

## 2016-10-23 DIAGNOSIS — J449 Chronic obstructive pulmonary disease, unspecified: Secondary | ICD-10-CM | POA: Diagnosis not present

## 2016-10-23 DIAGNOSIS — I10 Essential (primary) hypertension: Secondary | ICD-10-CM | POA: Diagnosis not present

## 2016-10-23 DIAGNOSIS — M21372 Foot drop, left foot: Secondary | ICD-10-CM | POA: Diagnosis not present

## 2016-10-30 DIAGNOSIS — R9721 Rising PSA following treatment for malignant neoplasm of prostate: Secondary | ICD-10-CM | POA: Diagnosis not present

## 2016-10-30 DIAGNOSIS — C775 Secondary and unspecified malignant neoplasm of intrapelvic lymph nodes: Secondary | ICD-10-CM | POA: Diagnosis not present

## 2016-10-30 DIAGNOSIS — C61 Malignant neoplasm of prostate: Secondary | ICD-10-CM | POA: Diagnosis not present

## 2016-10-30 DIAGNOSIS — N320 Bladder-neck obstruction: Secondary | ICD-10-CM | POA: Diagnosis not present

## 2016-11-02 DIAGNOSIS — J449 Chronic obstructive pulmonary disease, unspecified: Secondary | ICD-10-CM | POA: Diagnosis not present

## 2016-11-03 DIAGNOSIS — Z125 Encounter for screening for malignant neoplasm of prostate: Secondary | ICD-10-CM | POA: Diagnosis not present

## 2016-11-03 DIAGNOSIS — E784 Other hyperlipidemia: Secondary | ICD-10-CM | POA: Diagnosis not present

## 2016-11-03 DIAGNOSIS — M810 Age-related osteoporosis without current pathological fracture: Secondary | ICD-10-CM | POA: Diagnosis not present

## 2016-11-03 DIAGNOSIS — I10 Essential (primary) hypertension: Secondary | ICD-10-CM | POA: Diagnosis not present

## 2016-11-03 DIAGNOSIS — E1122 Type 2 diabetes mellitus with diabetic chronic kidney disease: Secondary | ICD-10-CM | POA: Diagnosis not present

## 2016-11-06 DIAGNOSIS — Z9981 Dependence on supplemental oxygen: Secondary | ICD-10-CM | POA: Diagnosis not present

## 2016-11-06 DIAGNOSIS — F17200 Nicotine dependence, unspecified, uncomplicated: Secondary | ICD-10-CM | POA: Diagnosis not present

## 2016-11-06 DIAGNOSIS — J449 Chronic obstructive pulmonary disease, unspecified: Secondary | ICD-10-CM | POA: Diagnosis not present

## 2016-11-06 DIAGNOSIS — Z0000 Encounter for general adult medical examination without abnormal findings: Secondary | ICD-10-CM | POA: Diagnosis not present

## 2016-11-06 DIAGNOSIS — E784 Other hyperlipidemia: Secondary | ICD-10-CM | POA: Diagnosis not present

## 2016-11-06 DIAGNOSIS — R8299 Other abnormal findings in urine: Secondary | ICD-10-CM | POA: Diagnosis not present

## 2016-11-06 DIAGNOSIS — E1122 Type 2 diabetes mellitus with diabetic chronic kidney disease: Secondary | ICD-10-CM | POA: Diagnosis not present

## 2016-11-06 DIAGNOSIS — F39 Unspecified mood [affective] disorder: Secondary | ICD-10-CM | POA: Diagnosis not present

## 2016-11-06 DIAGNOSIS — M810 Age-related osteoporosis without current pathological fracture: Secondary | ICD-10-CM | POA: Diagnosis not present

## 2016-11-06 DIAGNOSIS — N183 Chronic kidney disease, stage 3 (moderate): Secondary | ICD-10-CM | POA: Diagnosis not present

## 2016-11-10 DIAGNOSIS — C61 Malignant neoplasm of prostate: Secondary | ICD-10-CM | POA: Diagnosis not present

## 2016-11-10 DIAGNOSIS — Z5111 Encounter for antineoplastic chemotherapy: Secondary | ICD-10-CM | POA: Diagnosis not present

## 2016-11-23 DIAGNOSIS — J449 Chronic obstructive pulmonary disease, unspecified: Secondary | ICD-10-CM | POA: Diagnosis not present

## 2016-11-23 DIAGNOSIS — I10 Essential (primary) hypertension: Secondary | ICD-10-CM | POA: Diagnosis not present

## 2016-11-23 DIAGNOSIS — C61 Malignant neoplasm of prostate: Secondary | ICD-10-CM | POA: Diagnosis not present

## 2016-11-23 DIAGNOSIS — M21372 Foot drop, left foot: Secondary | ICD-10-CM | POA: Diagnosis not present

## 2016-12-02 DIAGNOSIS — J449 Chronic obstructive pulmonary disease, unspecified: Secondary | ICD-10-CM | POA: Diagnosis not present

## 2016-12-04 ENCOUNTER — Encounter: Payer: Self-pay | Admitting: Sports Medicine

## 2016-12-04 ENCOUNTER — Ambulatory Visit (INDEPENDENT_AMBULATORY_CARE_PROVIDER_SITE_OTHER): Payer: Medicare HMO | Admitting: Sports Medicine

## 2016-12-04 DIAGNOSIS — M2030 Hallux varus (acquired), unspecified foot: Secondary | ICD-10-CM

## 2016-12-04 DIAGNOSIS — M24575 Contracture, left foot: Secondary | ICD-10-CM

## 2016-12-04 DIAGNOSIS — M24572 Contracture, left ankle: Secondary | ICD-10-CM

## 2016-12-04 DIAGNOSIS — B351 Tinea unguium: Secondary | ICD-10-CM | POA: Diagnosis not present

## 2016-12-04 DIAGNOSIS — E1142 Type 2 diabetes mellitus with diabetic polyneuropathy: Secondary | ICD-10-CM

## 2016-12-04 DIAGNOSIS — M79676 Pain in unspecified toe(s): Secondary | ICD-10-CM

## 2016-12-04 NOTE — Progress Notes (Signed)
Patient ID: [name] [name], male   DOB: December 20, 1940, 76 y.o.   MRN: [id number]   Subjective: [name] [name] is a 76 y.o. male patient with history of type 2 diabetes who presents to office today complaining of long, painful nails and for fungal culture to be sent to lab. Admits Long nails cause pain while ambulating in shoes; unable to trim. Patient states that the glucose reading this morning was not recorded; but stays about the same. Patient denies any new changes in medication or new problems. Patient denies any new cramping, numbness, burning or tingling in the legs.  Patient Active Problem List   Diagnosis Date Noted  . Leg weakness 11/21/2015  . Lumbar canal stenosis 11/21/2015  . Open wound of heel 02/15/2015  . History of surgical procedure 11/16/2014  . Chronic obstructive pulmonary disease ([name] [name]) 10/31/2014  . Type 2 diabetes mellitus ([name] [name]) 10/31/2014  . BP (high blood pressure) 10/31/2014  . Acquired cavovarus deformity of foot 04/19/2014  . Acquired equinus deformity of foot 04/19/2014  . Ankle contracture 03/28/2014  . Rhabdomyolysis 07/05/2013  . COPD exacerbation ([name] [name]) 07/05/2013  . H/O prostate cancer 07/04/2013  . Diabetes mellitus ([name] [name]) 07/04/2013  . HTN (hypertension) 07/04/2013  . History of tobacco abuse 07/04/2013  . Fall 07/04/2013  . Thigh pain, musculoskeletal 07/04/2013  . Hypoxia 07/04/2013  . HLD (hyperlipidemia) 07/04/2013  . Depression 07/04/2013  . Chronic kidney disease 07/04/2013   Current Outpatient Prescriptions on File Prior to Visit  Medication Sig Dispense Refill  . albuterol (PROVENTIL) (2.5 MG/3ML) 0.083% nebulizer solution Take 2.5 mg by nebulization every 6 (six) hours as needed for wheezing or shortness of breath.     Marland Kitchen aspirin 325 MG tablet Take 325 mg by mouth daily.    . budesonide-formoterol (SYMBICORT) 160-4.5 MCG/ACT inhaler Inhale 2 puffs into the lungs 2 (two) times daily. 1 Inhaler 2  . cholecalciferol (VITAMIN D) 1000 UNITS tablet  Take 1,000 Units by mouth daily.    . diazepam (VALIUM) 5 MG tablet 5 mg every 8 (eight) hours as needed for anxiety.     . finasteride (PROSCAR) 5 MG tablet Take 5 mg by mouth daily.    Marland Kitchen FLUoxetine (PROZAC) 40 MG capsule Take 40 mg by mouth every morning.    Marland Kitchen glipiZIDE (GLUCOTROL) 10 MG tablet Take 10 mg by mouth 2 (two) times daily before a meal.    . hydrochlorothiazide (HYDRODIURIL) 25 MG tablet Take 25 mg by mouth every morning.    . hydroxypropyl methylcellulose (ISOPTO TEARS) 2.5 % ophthalmic solution [name] 1 drop into both eyes as needed (for dryness).     Marland Kitchen lisinopril (PRINIVIL,ZESTRIL) 20 MG tablet Take 10 mg by mouth every morning.    . metFORMIN (GLUCOPHAGE) 1000 MG tablet Take 1,000 mg by mouth 2 (two) times daily with a meal.    . oxyCODONE-acetaminophen (PERCOCET/ROXICET) 5-325 MG per tablet Take one tablet by mouth every 4 hours as needed for severe pain. Caution for altered mental status 180 tablet 0  . predniSONE (DELTASONE) 10 MG tablet Take 5 tablets (50 mg total) by mouth daily with breakfast. Start 07/08/13.  Decrease by one tablet daily until gone (Patient not taking: Reported on 09/04/2014) 15 tablet 0  . tiotropium (SPIRIVA) 18 MCG inhalation capsule [name] 18 mcg into inhaler and inhale daily.     No current facility-administered medications on file prior to visit.    Allergies  Allergen Reactions  . Oxycodone Other (See Comments)    Objective:  General: Patient is awake, alert, and oriented x 3 and in no acute distress.  Integument: Skin is warm, dry and supple bilateral. Nails are tender, long, thickened and dystrophic with subungual debris, consistent with onychomycosis, 1-5 bilateral. No signs of infection. No open lesions or preulcerative lesions present bilateral. Old scar left foot from previous surgery. Remaining integument unremarkable.  Vasculature:  Dorsalis Pedis pulse 1/4 bilateral. Posterior Tibial pulse  1/4 bilateral.  Capillary fill time <3 sec 1-5  bilateral. Scant hair growth to the level of the digits. Temperature gradient within normal limits. No varicosities present bilateral. No edema present bilateral.   Neurology: The patient has absent sensation measured with a 5.07/10g Semmes Weinstein Monofilament at all pedal sites bilateral. Vibratory sensation diminished to level of ankles bilateral with tuning fork. No Babinski sign present bilateral.   Musculoskeletal: Right hammertoe pedal deformities noted with spasticity. Muscular strength 4/5 right, 5/5 left in all lower extremity muscular groups bilateral without pain or limitation on range of motion except at left ankle due to previous injury. No tenderness with calf compression bilateral.  Assessment and Plan: Problem List Items Addressed This Visit    None    Visit Diagnoses    Pain due to onychomycosis of toenail    -  Primary   Hallux hammertoe, unspecified laterality       Contracture of ankle and foot joint, left       Diabetic polyneuropathy associated with type 2 diabetes mellitus ([name] [name])         -Examined patient. -Discussed and educated patient on diabetic foot care, especially with  regards to the vascular, neurological and musculoskeletal systems.  -Stressed the importance of good glycemic control and the detriment of not controlling glucose levels in relation to the foot. -Mechanically debrided all nails 1-5 bilateral using sterile nail nipper and filed with dremel without incident -Patient will decide later to start lamisil and will let me know when he want to do itFor positive fungal culture -Answered all patient questions -Patient to return in 3 months diabetic nail trim -Patient advised to call the office if any problems or questions arise in the meantime.  [name] [name], DPM

## 2016-12-21 DIAGNOSIS — I10 Essential (primary) hypertension: Secondary | ICD-10-CM | POA: Diagnosis not present

## 2016-12-21 DIAGNOSIS — J449 Chronic obstructive pulmonary disease, unspecified: Secondary | ICD-10-CM | POA: Diagnosis not present

## 2016-12-21 DIAGNOSIS — C61 Malignant neoplasm of prostate: Secondary | ICD-10-CM | POA: Diagnosis not present

## 2016-12-21 DIAGNOSIS — M21372 Foot drop, left foot: Secondary | ICD-10-CM | POA: Diagnosis not present

## 2016-12-31 DIAGNOSIS — J449 Chronic obstructive pulmonary disease, unspecified: Secondary | ICD-10-CM | POA: Diagnosis not present

## 2017-01-21 DIAGNOSIS — M21372 Foot drop, left foot: Secondary | ICD-10-CM | POA: Diagnosis not present

## 2017-01-21 DIAGNOSIS — J449 Chronic obstructive pulmonary disease, unspecified: Secondary | ICD-10-CM | POA: Diagnosis not present

## 2017-01-21 DIAGNOSIS — I10 Essential (primary) hypertension: Secondary | ICD-10-CM | POA: Diagnosis not present

## 2017-01-21 DIAGNOSIS — C61 Malignant neoplasm of prostate: Secondary | ICD-10-CM | POA: Diagnosis not present

## 2017-01-31 DIAGNOSIS — J449 Chronic obstructive pulmonary disease, unspecified: Secondary | ICD-10-CM | POA: Diagnosis not present

## 2017-02-20 DIAGNOSIS — C61 Malignant neoplasm of prostate: Secondary | ICD-10-CM | POA: Diagnosis not present

## 2017-02-20 DIAGNOSIS — I10 Essential (primary) hypertension: Secondary | ICD-10-CM | POA: Diagnosis not present

## 2017-02-20 DIAGNOSIS — M21372 Foot drop, left foot: Secondary | ICD-10-CM | POA: Diagnosis not present

## 2017-02-20 DIAGNOSIS — J449 Chronic obstructive pulmonary disease, unspecified: Secondary | ICD-10-CM | POA: Diagnosis not present

## 2017-03-02 DIAGNOSIS — J449 Chronic obstructive pulmonary disease, unspecified: Secondary | ICD-10-CM | POA: Diagnosis not present

## 2017-03-12 ENCOUNTER — Ambulatory Visit: Payer: Self-pay | Admitting: Sports Medicine

## 2017-03-23 DIAGNOSIS — C61 Malignant neoplasm of prostate: Secondary | ICD-10-CM | POA: Diagnosis not present

## 2017-03-23 DIAGNOSIS — J449 Chronic obstructive pulmonary disease, unspecified: Secondary | ICD-10-CM | POA: Diagnosis not present

## 2017-03-23 DIAGNOSIS — I10 Essential (primary) hypertension: Secondary | ICD-10-CM | POA: Diagnosis not present

## 2017-03-23 DIAGNOSIS — M21372 Foot drop, left foot: Secondary | ICD-10-CM | POA: Diagnosis not present

## 2017-04-02 DIAGNOSIS — J449 Chronic obstructive pulmonary disease, unspecified: Secondary | ICD-10-CM | POA: Diagnosis not present

## 2017-04-22 DIAGNOSIS — I10 Essential (primary) hypertension: Secondary | ICD-10-CM | POA: Diagnosis not present

## 2017-04-22 DIAGNOSIS — M21372 Foot drop, left foot: Secondary | ICD-10-CM | POA: Diagnosis not present

## 2017-04-22 DIAGNOSIS — C61 Malignant neoplasm of prostate: Secondary | ICD-10-CM | POA: Diagnosis not present

## 2017-04-22 DIAGNOSIS — J449 Chronic obstructive pulmonary disease, unspecified: Secondary | ICD-10-CM | POA: Diagnosis not present

## 2017-05-02 DIAGNOSIS — J449 Chronic obstructive pulmonary disease, unspecified: Secondary | ICD-10-CM | POA: Diagnosis not present

## 2017-05-06 ENCOUNTER — Ambulatory Visit (INDEPENDENT_AMBULATORY_CARE_PROVIDER_SITE_OTHER): Payer: Medicare HMO | Admitting: Sports Medicine

## 2017-05-06 DIAGNOSIS — E1142 Type 2 diabetes mellitus with diabetic polyneuropathy: Secondary | ICD-10-CM

## 2017-05-06 DIAGNOSIS — B351 Tinea unguium: Secondary | ICD-10-CM | POA: Diagnosis not present

## 2017-05-06 DIAGNOSIS — M24572 Contracture, left ankle: Secondary | ICD-10-CM

## 2017-05-06 DIAGNOSIS — M24575 Contracture, left foot: Secondary | ICD-10-CM

## 2017-05-06 DIAGNOSIS — M79676 Pain in unspecified toe(s): Secondary | ICD-10-CM | POA: Diagnosis not present

## 2017-05-06 DIAGNOSIS — M2030 Hallux varus (acquired), unspecified foot: Secondary | ICD-10-CM

## 2017-05-06 NOTE — Progress Notes (Signed)
Patient ID: [name] [name], male   DOB: June 12, 1941, 76 y.o.   MRN: [id number]   Subjective: [name] [name] is a 76 y.o. male patient with history of type 2 diabetes who presents to office today complaining of long, painful nails; unable to trim. Patient states that the glucose reading this morning was not recorded; but stays about the same, no changes. Patient denies any new changes in medication or new problems.  Patient Active Problem List   Diagnosis Date Noted  . Leg weakness 11/21/2015  . Lumbar canal stenosis 11/21/2015  . Open wound of heel 02/15/2015  . History of surgical procedure 11/16/2014  . Chronic obstructive pulmonary disease ([place]) 10/31/2014  . Type 2 diabetes mellitus ([place]) 10/31/2014  . BP (high blood pressure) 10/31/2014  . Acquired cavovarus deformity of foot 04/19/2014  . Acquired equinus deformity of foot 04/19/2014  . Ankle contracture 03/28/2014  . Rhabdomyolysis 07/05/2013  . COPD exacerbation ([place]) 07/05/2013  . H/O prostate cancer 07/04/2013  . Diabetes mellitus ([place]) 07/04/2013  . HTN (hypertension) 07/04/2013  . History of tobacco abuse 07/04/2013  . Fall 07/04/2013  . Thigh pain, musculoskeletal 07/04/2013  . Hypoxia 07/04/2013  . HLD (hyperlipidemia) 07/04/2013  . Depression 07/04/2013  . Chronic kidney disease 07/04/2013   Current Outpatient Prescriptions on File Prior to Visit  Medication Sig Dispense Refill  . albuterol (PROVENTIL) (2.5 MG/3ML) 0.083% nebulizer solution Take 2.5 mg by nebulization every 6 (six) hours as needed for wheezing or shortness of breath.     Marland Kitchen aspirin 325 MG tablet Take 325 mg by mouth daily.    . budesonide-formoterol (SYMBICORT) 160-4.5 MCG/ACT inhaler Inhale 2 puffs into the lungs 2 (two) times daily. 1 Inhaler 2  . cholecalciferol (VITAMIN D) 1000 UNITS tablet Take 1,000 Units by mouth daily.    . diazepam (VALIUM) 5 MG tablet 5 mg every 8 (eight) hours as needed for anxiety.     . finasteride (PROSCAR) 5 MG tablet  Take 5 mg by mouth daily.    Marland Kitchen FLUoxetine (PROZAC) 40 MG capsule Take 40 mg by mouth every morning.    Marland Kitchen glipiZIDE (GLUCOTROL) 10 MG tablet Take 10 mg by mouth 2 (two) times daily before a meal.    . hydrochlorothiazide (HYDRODIURIL) 25 MG tablet Take 25 mg by mouth every morning.    . hydroxypropyl methylcellulose (ISOPTO TEARS) 2.5 % ophthalmic solution Place 1 drop into both eyes as needed (for dryness).     Marland Kitchen lisinopril (PRINIVIL,ZESTRIL) 20 MG tablet Take 10 mg by mouth every morning.    . metFORMIN (GLUCOPHAGE) 1000 MG tablet Take 1,000 mg by mouth 2 (two) times daily with a meal.    . oxyCODONE-acetaminophen (PERCOCET/ROXICET) 5-325 MG per tablet Take one tablet by mouth every 4 hours as needed for severe pain. Caution for altered mental status 180 tablet 0  . predniSONE (DELTASONE) 10 MG tablet Take 5 tablets (50 mg total) by mouth daily with breakfast. Start 07/08/13.  Decrease by one tablet daily until gone (Patient not taking: Reported on 09/04/2014) 15 tablet 0  . tiotropium (SPIRIVA) 18 MCG inhalation capsule Place 18 mcg into inhaler and inhale daily.     No current facility-administered medications on file prior to visit.    Allergies  Allergen Reactions  . Oxycodone Other (See Comments)    Objective: General: Patient is awake, alert, and oriented x 3 and in no acute distress.  Integument: Skin is warm, dry and supple bilateral. Nails are tender, long, thickened  and dystrophic with subungual debris, consistent with onychomycosis, 1-5 bilateral. No signs of infection. No open lesions or preulcerative lesions present bilateral. Old scar left foot from previous surgery. Remaining integument unremarkable.  Vasculature:  Dorsalis Pedis pulse 1/4 bilateral. Posterior Tibial pulse  1/4 bilateral.  Capillary fill time <3 sec 1-5 bilateral. Scant hair growth to the level of the digits. Temperature gradient within normal limits. No varicosities present bilateral. No edema present  bilateral.   Neurology: The patient has absent sensation measured with a 5.07/10g Semmes Weinstein Monofilament at all pedal sites bilateral. Vibratory sensation diminished to level of ankles bilateral with tuning fork. No Babinski sign present bilateral.   Musculoskeletal: Right hammertoe pedal deformities noted with spasticity. Muscular strength 4/5 right, 5/5 left in all lower extremity muscular groups bilateral without pain or limitation on range of motion except at left ankle due to previous injury. No tenderness with calf compression bilateral.  Assessment and Plan: Problem List Items Addressed This Visit    None    Visit Diagnoses    Pain due to onychomycosis of toenail    -  Primary   Diabetic polyneuropathy associated with type 2 diabetes mellitus (HCC)       Hallux hammertoe, unspecified laterality       Contracture of ankle and foot joint, left         -Examined patient. -Discussed and educated patient on diabetic foot care, especially with  regards to the vascular, neurological and musculoskeletal systems.  -Stressed the importance of good glycemic control and the detriment of not controlling glucose levels in relation to the foot. -Mechanically debrided all nails 1-5 bilateral using sterile nail nipper and filed with dremel without incident -Patient will decide later to start lamisil and will let me know when he wants to do it for positive fungal culture -Answered all patient questions -Patient to return in 3 months diabetic nail trim -Patient advised to call the office if any problems or questions arise in the meantime.  [name] [name], DPM

## 2017-05-12 DIAGNOSIS — C61 Malignant neoplasm of prostate: Secondary | ICD-10-CM | POA: Diagnosis not present

## 2017-05-12 DIAGNOSIS — F17200 Nicotine dependence, unspecified, uncomplicated: Secondary | ICD-10-CM | POA: Diagnosis not present

## 2017-05-12 DIAGNOSIS — E668 Other obesity: Secondary | ICD-10-CM | POA: Diagnosis not present

## 2017-05-12 DIAGNOSIS — F1021 Alcohol dependence, in remission: Secondary | ICD-10-CM | POA: Diagnosis not present

## 2017-05-12 DIAGNOSIS — Z6831 Body mass index (BMI) 31.0-31.9, adult: Secondary | ICD-10-CM | POA: Diagnosis not present

## 2017-05-12 DIAGNOSIS — E1122 Type 2 diabetes mellitus with diabetic chronic kidney disease: Secondary | ICD-10-CM | POA: Diagnosis not present

## 2017-05-12 DIAGNOSIS — N183 Chronic kidney disease, stage 3 (moderate): Secondary | ICD-10-CM | POA: Diagnosis not present

## 2017-05-12 DIAGNOSIS — I10 Essential (primary) hypertension: Secondary | ICD-10-CM | POA: Diagnosis not present

## 2017-05-12 DIAGNOSIS — Z9981 Dependence on supplemental oxygen: Secondary | ICD-10-CM | POA: Diagnosis not present

## 2017-05-23 DIAGNOSIS — J449 Chronic obstructive pulmonary disease, unspecified: Secondary | ICD-10-CM | POA: Diagnosis not present

## 2017-05-23 DIAGNOSIS — M21372 Foot drop, left foot: Secondary | ICD-10-CM | POA: Diagnosis not present

## 2017-05-23 DIAGNOSIS — I10 Essential (primary) hypertension: Secondary | ICD-10-CM | POA: Diagnosis not present

## 2017-05-23 DIAGNOSIS — C61 Malignant neoplasm of prostate: Secondary | ICD-10-CM | POA: Diagnosis not present

## 2017-05-24 DIAGNOSIS — C61 Malignant neoplasm of prostate: Secondary | ICD-10-CM | POA: Diagnosis not present

## 2017-05-31 DIAGNOSIS — E291 Testicular hypofunction: Secondary | ICD-10-CM | POA: Diagnosis not present

## 2017-05-31 DIAGNOSIS — C61 Malignant neoplasm of prostate: Secondary | ICD-10-CM | POA: Diagnosis not present

## 2017-06-02 DIAGNOSIS — J449 Chronic obstructive pulmonary disease, unspecified: Secondary | ICD-10-CM | POA: Diagnosis not present

## 2017-06-23 DIAGNOSIS — I10 Essential (primary) hypertension: Secondary | ICD-10-CM | POA: Diagnosis not present

## 2017-06-23 DIAGNOSIS — C61 Malignant neoplasm of prostate: Secondary | ICD-10-CM | POA: Diagnosis not present

## 2017-06-23 DIAGNOSIS — J449 Chronic obstructive pulmonary disease, unspecified: Secondary | ICD-10-CM | POA: Diagnosis not present

## 2017-06-23 DIAGNOSIS — M21372 Foot drop, left foot: Secondary | ICD-10-CM | POA: Diagnosis not present

## 2017-07-03 DIAGNOSIS — J449 Chronic obstructive pulmonary disease, unspecified: Secondary | ICD-10-CM | POA: Diagnosis not present

## 2017-07-23 DIAGNOSIS — C61 Malignant neoplasm of prostate: Secondary | ICD-10-CM | POA: Diagnosis not present

## 2017-07-23 DIAGNOSIS — I10 Essential (primary) hypertension: Secondary | ICD-10-CM | POA: Diagnosis not present

## 2017-07-23 DIAGNOSIS — M21372 Foot drop, left foot: Secondary | ICD-10-CM | POA: Diagnosis not present

## 2017-07-23 DIAGNOSIS — J449 Chronic obstructive pulmonary disease, unspecified: Secondary | ICD-10-CM | POA: Diagnosis not present

## 2017-08-02 DIAGNOSIS — J449 Chronic obstructive pulmonary disease, unspecified: Secondary | ICD-10-CM | POA: Diagnosis not present

## 2017-08-04 DIAGNOSIS — E1122 Type 2 diabetes mellitus with diabetic chronic kidney disease: Secondary | ICD-10-CM | POA: Diagnosis not present

## 2017-08-04 DIAGNOSIS — E11621 Type 2 diabetes mellitus with foot ulcer: Secondary | ICD-10-CM | POA: Diagnosis not present

## 2017-08-04 DIAGNOSIS — Z6834 Body mass index (BMI) 34.0-34.9, adult: Secondary | ICD-10-CM | POA: Diagnosis not present

## 2017-08-04 DIAGNOSIS — B353 Tinea pedis: Secondary | ICD-10-CM | POA: Diagnosis not present

## 2017-08-06 ENCOUNTER — Ambulatory Visit (INDEPENDENT_AMBULATORY_CARE_PROVIDER_SITE_OTHER): Payer: Medicare HMO | Admitting: Sports Medicine

## 2017-08-06 DIAGNOSIS — B351 Tinea unguium: Secondary | ICD-10-CM | POA: Diagnosis not present

## 2017-08-06 DIAGNOSIS — S90414A Abrasion, right lesser toe(s), initial encounter: Secondary | ICD-10-CM

## 2017-08-06 DIAGNOSIS — E1142 Type 2 diabetes mellitus with diabetic polyneuropathy: Secondary | ICD-10-CM | POA: Diagnosis not present

## 2017-08-06 DIAGNOSIS — M79676 Pain in unspecified toe(s): Secondary | ICD-10-CM | POA: Diagnosis not present

## 2017-08-06 NOTE — Progress Notes (Signed)
Patient ID: Hart Robinsons, male   DOB: May 01, 1941, 76 y.o.   MRN: [id number]   Subjective: SID GREENER is a 76 y.o. male patient with history of type 2 diabetes who presents to office today complaining of long, painful nails; unable to trim. Patient states that the glucose reading this morning was not recorded; but good per doctor visit on yesterday. Admits new sore/scrape to right 2nd toe, PCP started on Doxycycline and recommended bactroban of which they are using. Denies any constitutional symptoms. Patient denies any new changes in medication or new problems.  Patient Active Problem List   Diagnosis Date Noted  . Leg weakness 11/21/2015  . Lumbar canal stenosis 11/21/2015  . Open wound of heel 02/15/2015  . History of surgical procedure 11/16/2014  . Chronic obstructive pulmonary disease ([place]) 10/31/2014  . Type 2 diabetes mellitus ([place]) 10/31/2014  . BP (high blood pressure) 10/31/2014  . Acquired cavovarus deformity of foot 04/19/2014  . Acquired equinus deformity of foot 04/19/2014  . Ankle contracture 03/28/2014  . Rhabdomyolysis 07/05/2013  . COPD exacerbation ([place]) 07/05/2013  . H/O prostate cancer 07/04/2013  . Diabetes mellitus ([place]) 07/04/2013  . HTN (hypertension) 07/04/2013  . History of tobacco abuse 07/04/2013  . Fall 07/04/2013  . Thigh pain, musculoskeletal 07/04/2013  . Hypoxia 07/04/2013  . HLD (hyperlipidemia) 07/04/2013  . Depression 07/04/2013  . Chronic kidney disease 07/04/2013   Current Outpatient Prescriptions on File Prior to Visit  Medication Sig Dispense Refill  . albuterol (PROVENTIL) (2.5 MG/3ML) 0.083% nebulizer solution Take 2.5 mg by nebulization every 6 (six) hours as needed for wheezing or shortness of breath.     Marland Kitchen aspirin 325 MG tablet Take 325 mg by mouth daily.    . budesonide-formoterol (SYMBICORT) 160-4.5 MCG/ACT inhaler Inhale 2 puffs into the lungs 2 (two) times daily. 1 Inhaler 2  . cholecalciferol (VITAMIN D) 1000 UNITS tablet Take  1,000 Units by mouth daily.    . diazepam (VALIUM) 5 MG tablet 5 mg every 8 (eight) hours as needed for anxiety.     . finasteride (PROSCAR) 5 MG tablet Take 5 mg by mouth daily.    Marland Kitchen FLUoxetine (PROZAC) 40 MG capsule Take 40 mg by mouth every morning.    Marland Kitchen glipiZIDE (GLUCOTROL) 10 MG tablet Take 10 mg by mouth 2 (two) times daily before a meal.    . hydrochlorothiazide (HYDRODIURIL) 25 MG tablet Take 25 mg by mouth every morning.    . hydroxypropyl methylcellulose (ISOPTO TEARS) 2.5 % ophthalmic solution Place 1 drop into both eyes as needed (for dryness).     Marland Kitchen lisinopril (PRINIVIL,ZESTRIL) 20 MG tablet Take 10 mg by mouth every morning.    . metFORMIN (GLUCOPHAGE) 1000 MG tablet Take 1,000 mg by mouth 2 (two) times daily with a meal.    . oxyCODONE-acetaminophen (PERCOCET/ROXICET) 5-325 MG per tablet Take one tablet by mouth every 4 hours as needed for severe pain. Caution for altered mental status 180 tablet 0  . predniSONE (DELTASONE) 10 MG tablet Take 5 tablets (50 mg total) by mouth daily with breakfast. Start 07/08/13.  Decrease by one tablet daily until gone (Patient not taking: Reported on 09/04/2014) 15 tablet 0  . tiotropium (SPIRIVA) 18 MCG inhalation capsule Place 18 mcg into inhaler and inhale daily.     No current facility-administered medications on file prior to visit.    Allergies  Allergen Reactions  . Oxycodone Other (See Comments)    Objective: General: Patient is awake, alert,  and oriented x 3 and in no acute distress.  Integument: Skin is warm, dry and supple bilateral. Nails are tender, long, thickened and dystrophic with subungual debris, consistent with onychomycosis, 1-5 bilateral. No signs of infection. Abrasion/ preulcerative lesions present distal tuft of right 2nd toe with no infection. Old scar left foot from previous surgery. Remaining integument unremarkable.  Vasculature:  Dorsalis Pedis pulse 1/4 bilateral. Posterior Tibial pulse  1/4 bilateral.  Capillary  fill time <3 sec 1-5 bilateral. Scant hair growth to the level of the digits. Temperature gradient within normal limits. No varicosities present bilateral. No edema present bilateral.   Neurology: The patient has absent sensation measured with a 5.07/10g Semmes Weinstein Monofilament at all pedal sites bilateral. Vibratory sensation diminished to level of ankles bilateral with tuning fork. No Babinski sign present bilateral.   Musculoskeletal: Right hammertoe pedal deformities noted with spasticity. Muscular strength 4/5 right, 5/5 left in all lower extremity muscular groups bilateral without pain or limitation on range of motion except at left ankle due to previous injury. No tenderness with calf compression bilateral.  Assessment and Plan: Problem List Items Addressed This Visit    None    Visit Diagnoses    Pain due to onychomycosis of toenail    -  Primary   Diabetic polyneuropathy associated with type 2 diabetes mellitus (HCC)       Abrasion of second toe of right foot, initial encounter         -Examined patient. -Discussed and educated patient on diabetic foot care, especially with  regards to the vascular, neurological and musculoskeletal systems.  -Stressed the importance of good glycemic control and the detriment of not controlling glucose levels in relation to the foot. -Mechanically debrided all nails 1-5 bilateral using sterile nail nipper and filed with dremel without incident -Applied antibiotic cream and bandaid with toe crest pad at right 2nd toe. Advised patient to continue with bactroban and taking Doxycycline if fails to get better in 1-2 weeks to return to office to be seen sooner -Answered all patient questions -Patient to return in 3 months diabetic nail trim -Patient advised to call the office if any problems or questions arise in the meantime.  [name], DPM

## 2017-08-23 DIAGNOSIS — I10 Essential (primary) hypertension: Secondary | ICD-10-CM | POA: Diagnosis not present

## 2017-08-23 DIAGNOSIS — M21372 Foot drop, left foot: Secondary | ICD-10-CM | POA: Diagnosis not present

## 2017-08-23 DIAGNOSIS — C61 Malignant neoplasm of prostate: Secondary | ICD-10-CM | POA: Diagnosis not present

## 2017-08-23 DIAGNOSIS — J449 Chronic obstructive pulmonary disease, unspecified: Secondary | ICD-10-CM | POA: Diagnosis not present

## 2017-09-02 DIAGNOSIS — J449 Chronic obstructive pulmonary disease, unspecified: Secondary | ICD-10-CM | POA: Diagnosis not present

## 2017-09-10 DIAGNOSIS — M25532 Pain in left wrist: Secondary | ICD-10-CM | POA: Diagnosis not present

## 2017-09-10 DIAGNOSIS — R609 Edema, unspecified: Secondary | ICD-10-CM | POA: Diagnosis not present

## 2017-09-22 DIAGNOSIS — I10 Essential (primary) hypertension: Secondary | ICD-10-CM | POA: Diagnosis not present

## 2017-09-22 DIAGNOSIS — J449 Chronic obstructive pulmonary disease, unspecified: Secondary | ICD-10-CM | POA: Diagnosis not present

## 2017-09-22 DIAGNOSIS — C61 Malignant neoplasm of prostate: Secondary | ICD-10-CM | POA: Diagnosis not present

## 2017-09-22 DIAGNOSIS — M21372 Foot drop, left foot: Secondary | ICD-10-CM | POA: Diagnosis not present

## 2017-10-02 DIAGNOSIS — J449 Chronic obstructive pulmonary disease, unspecified: Secondary | ICD-10-CM | POA: Diagnosis not present

## 2017-10-06 DIAGNOSIS — C61 Malignant neoplasm of prostate: Secondary | ICD-10-CM | POA: Diagnosis not present

## 2017-10-08 DIAGNOSIS — R600 Localized edema: Secondary | ICD-10-CM | POA: Diagnosis not present

## 2017-10-08 DIAGNOSIS — C775 Secondary and unspecified malignant neoplasm of intrapelvic lymph nodes: Secondary | ICD-10-CM | POA: Diagnosis not present

## 2017-10-08 DIAGNOSIS — E291 Testicular hypofunction: Secondary | ICD-10-CM | POA: Diagnosis not present

## 2017-10-08 DIAGNOSIS — C61 Malignant neoplasm of prostate: Secondary | ICD-10-CM | POA: Diagnosis not present

## 2017-10-08 DIAGNOSIS — Z6833 Body mass index (BMI) 33.0-33.9, adult: Secondary | ICD-10-CM | POA: Diagnosis not present

## 2017-10-08 DIAGNOSIS — I10 Essential (primary) hypertension: Secondary | ICD-10-CM | POA: Diagnosis not present

## 2017-10-08 DIAGNOSIS — C7951 Secondary malignant neoplasm of bone: Secondary | ICD-10-CM | POA: Diagnosis not present

## 2017-10-08 DIAGNOSIS — Z9981 Dependence on supplemental oxygen: Secondary | ICD-10-CM | POA: Diagnosis not present

## 2017-10-08 DIAGNOSIS — J449 Chronic obstructive pulmonary disease, unspecified: Secondary | ICD-10-CM | POA: Diagnosis not present

## 2017-10-08 DIAGNOSIS — M79604 Pain in right leg: Secondary | ICD-10-CM | POA: Diagnosis not present

## 2017-10-08 DIAGNOSIS — M1990 Unspecified osteoarthritis, unspecified site: Secondary | ICD-10-CM | POA: Diagnosis not present

## 2017-10-11 ENCOUNTER — Other Ambulatory Visit (HOSPITAL_COMMUNITY): Payer: Self-pay | Admitting: Internal Medicine

## 2017-10-11 ENCOUNTER — Ambulatory Visit (HOSPITAL_COMMUNITY)
Admission: RE | Admit: 2017-10-11 | Discharge: 2017-10-11 | Disposition: A | Discharge status: Home / Self Care | Payer: Medicare HMO | Source: Ambulatory Visit | Attending: Surgery | Admitting: Surgery

## 2017-10-11 DIAGNOSIS — M79604 Pain in right leg: Secondary | ICD-10-CM | POA: Insufficient documentation

## 2017-10-11 DIAGNOSIS — R600 Localized edema: Secondary | ICD-10-CM | POA: Insufficient documentation

## 2017-10-11 DIAGNOSIS — R609 Edema, unspecified: Secondary | ICD-10-CM | POA: Diagnosis not present

## 2017-10-22 DIAGNOSIS — R0902 Hypoxemia: Secondary | ICD-10-CM | POA: Diagnosis not present

## 2017-10-22 DIAGNOSIS — J449 Chronic obstructive pulmonary disease, unspecified: Secondary | ICD-10-CM | POA: Diagnosis not present

## 2017-10-23 DIAGNOSIS — I10 Essential (primary) hypertension: Secondary | ICD-10-CM | POA: Diagnosis not present

## 2017-10-23 DIAGNOSIS — C61 Malignant neoplasm of prostate: Secondary | ICD-10-CM | POA: Diagnosis not present

## 2017-10-23 DIAGNOSIS — M21372 Foot drop, left foot: Secondary | ICD-10-CM | POA: Diagnosis not present

## 2017-10-23 DIAGNOSIS — J449 Chronic obstructive pulmonary disease, unspecified: Secondary | ICD-10-CM | POA: Diagnosis not present

## 2017-11-02 DIAGNOSIS — J449 Chronic obstructive pulmonary disease, unspecified: Secondary | ICD-10-CM | POA: Diagnosis not present

## 2017-11-08 DIAGNOSIS — E782 Mixed hyperlipidemia: Secondary | ICD-10-CM | POA: Diagnosis not present

## 2017-11-08 DIAGNOSIS — D509 Iron deficiency anemia, unspecified: Secondary | ICD-10-CM | POA: Diagnosis not present

## 2017-11-08 DIAGNOSIS — M810 Age-related osteoporosis without current pathological fracture: Secondary | ICD-10-CM | POA: Diagnosis not present

## 2017-11-08 DIAGNOSIS — R600 Localized edema: Secondary | ICD-10-CM | POA: Diagnosis not present

## 2017-11-08 DIAGNOSIS — Z125 Encounter for screening for malignant neoplasm of prostate: Secondary | ICD-10-CM | POA: Diagnosis not present

## 2017-11-08 DIAGNOSIS — E1122 Type 2 diabetes mellitus with diabetic chronic kidney disease: Secondary | ICD-10-CM | POA: Diagnosis not present

## 2017-11-08 DIAGNOSIS — I10 Essential (primary) hypertension: Secondary | ICD-10-CM | POA: Diagnosis not present

## 2017-11-10 ENCOUNTER — Ambulatory Visit: Payer: Medicare HMO | Admitting: Sports Medicine

## 2017-11-12 DIAGNOSIS — E668 Other obesity: Secondary | ICD-10-CM | POA: Diagnosis not present

## 2017-11-12 DIAGNOSIS — I872 Venous insufficiency (chronic) (peripheral): Secondary | ICD-10-CM | POA: Diagnosis not present

## 2017-11-12 DIAGNOSIS — J449 Chronic obstructive pulmonary disease, unspecified: Secondary | ICD-10-CM | POA: Diagnosis not present

## 2017-11-12 DIAGNOSIS — E782 Mixed hyperlipidemia: Secondary | ICD-10-CM | POA: Diagnosis not present

## 2017-11-12 DIAGNOSIS — D508 Other iron deficiency anemias: Secondary | ICD-10-CM | POA: Diagnosis not present

## 2017-11-12 DIAGNOSIS — E1122 Type 2 diabetes mellitus with diabetic chronic kidney disease: Secondary | ICD-10-CM | POA: Diagnosis not present

## 2017-11-12 DIAGNOSIS — Z0000 Encounter for general adult medical examination without abnormal findings: Secondary | ICD-10-CM | POA: Diagnosis not present

## 2017-11-12 DIAGNOSIS — Z23 Encounter for immunization: Secondary | ICD-10-CM | POA: Diagnosis not present

## 2017-11-12 DIAGNOSIS — I10 Essential (primary) hypertension: Secondary | ICD-10-CM | POA: Diagnosis not present

## 2017-11-12 DIAGNOSIS — N183 Chronic kidney disease, stage 3 (moderate): Secondary | ICD-10-CM | POA: Diagnosis not present

## 2017-11-15 ENCOUNTER — Encounter (HOSPITAL_COMMUNITY): Payer: Self-pay

## 2017-11-15 ENCOUNTER — Emergency Department (HOSPITAL_COMMUNITY)
Admission: EM | Admit: 2017-11-15 | Discharge: 2017-11-15 | Disposition: A | Discharge status: Home / Self Care | Payer: Medicare HMO | Attending: Emergency Medicine | Admitting: Emergency Medicine

## 2017-11-15 ENCOUNTER — Emergency Department (HOSPITAL_COMMUNITY): Payer: Medicare HMO

## 2017-11-15 ENCOUNTER — Other Ambulatory Visit: Payer: Self-pay

## 2017-11-15 DIAGNOSIS — Z79899 Other long term (current) drug therapy: Secondary | ICD-10-CM | POA: Diagnosis not present

## 2017-11-15 DIAGNOSIS — R531 Weakness: Secondary | ICD-10-CM

## 2017-11-15 DIAGNOSIS — I10 Essential (primary) hypertension: Secondary | ICD-10-CM | POA: Diagnosis not present

## 2017-11-15 DIAGNOSIS — E119 Type 2 diabetes mellitus without complications: Secondary | ICD-10-CM | POA: Insufficient documentation

## 2017-11-15 DIAGNOSIS — R0600 Dyspnea, unspecified: Secondary | ICD-10-CM

## 2017-11-15 DIAGNOSIS — R0602 Shortness of breath: Secondary | ICD-10-CM | POA: Diagnosis not present

## 2017-11-15 DIAGNOSIS — R069 Unspecified abnormalities of breathing: Secondary | ICD-10-CM | POA: Diagnosis not present

## 2017-11-15 DIAGNOSIS — Z8546 Personal history of malignant neoplasm of prostate: Secondary | ICD-10-CM | POA: Diagnosis not present

## 2017-11-15 DIAGNOSIS — Z87891 Personal history of nicotine dependence: Secondary | ICD-10-CM | POA: Insufficient documentation

## 2017-11-15 DIAGNOSIS — Z7982 Long term (current) use of aspirin: Secondary | ICD-10-CM | POA: Insufficient documentation

## 2017-11-15 LAB — BASIC METABOLIC PANEL
ANION GAP: 13 (ref 5–15)
BUN: 17 mg/dL (ref 6–20)
CO2: 23 mmol/L (ref 22–32)
Calcium: 9 mg/dL (ref 8.9–10.3)
Chloride: 102 mmol/L (ref 101–111)
Creatinine, Ser: 1.36 mg/dL — ABNORMAL HIGH (ref 0.61–1.24)
GFR calc Af Amer: 56 mL/min — ABNORMAL LOW (ref >60)
GFR, EST NON AFRICAN AMERICAN: 48 mL/min — AB (ref >60)
Glucose, Bld: 67 mg/dL (ref 65–99)
Potassium: 4.6 mmol/L (ref 3.5–5.1)
SODIUM: 138 mmol/L (ref 135–145)

## 2017-11-15 LAB — RETICULOCYTES
RBC.: 3.33 MIL/uL — ABNORMAL LOW (ref 4.22–5.81)
RETIC CT PCT: 1.6 % (ref 0.4–3.1)
Retic Count, Absolute: 53.3 10*3/uL (ref 19.0–186.0)

## 2017-11-15 LAB — VITAMIN B12: VITAMIN B 12: 104 pg/mL — AB (ref 180–914)

## 2017-11-15 LAB — IRON AND TIBC
Iron: 10 ug/dL — ABNORMAL LOW (ref 45–182)
Saturation Ratios: 3 % — ABNORMAL LOW (ref 17.9–39.5)
TIBC: 311 ug/dL (ref 250–450)
UIBC: 301 ug/dL

## 2017-11-15 LAB — CBG MONITORING, ED
Glucose-Capillary: 47 mg/dL — ABNORMAL LOW (ref 65–99)
Glucose-Capillary: 175 mg/dL — ABNORMAL HIGH (ref 65–99)

## 2017-11-15 LAB — CBC
HCT: 27.9 % — ABNORMAL LOW (ref 39.0–52.0)
Hemoglobin: 8.3 g/dL — ABNORMAL LOW (ref 13.0–17.0)
MCH: 24.1 pg — ABNORMAL LOW (ref 26.0–34.0)
MCHC: 29.7 g/dL — ABNORMAL LOW (ref 30.0–36.0)
MCV: 80.9 fL (ref 78.0–100.0)
Platelets: 423 10*3/uL — ABNORMAL HIGH (ref 150–400)
RBC: 3.45 MIL/uL — ABNORMAL LOW (ref 4.22–5.81)
RDW: 15.1 % (ref 11.5–15.5)
WBC: 11.3 10*3/uL — ABNORMAL HIGH (ref 4.0–10.5)

## 2017-11-15 LAB — FOLATE: Folate: 8.8 ng/mL (ref >5.9)

## 2017-11-15 LAB — FERRITIN: FERRITIN: 100 ng/mL (ref 24–336)

## 2017-11-15 LAB — I-STAT TROPONIN, ED: TROPONIN I, POC: 0 ng/mL (ref 0.00–0.08)

## 2017-11-15 MED ORDER — ALBUTEROL SULFATE (2.5 MG/3ML) 0.083% IN NEBU
5.0000 mg | INHALATION_SOLUTION | Freq: Once | RESPIRATORY_TRACT | Status: AC
  Administered 2017-11-15: 5 mg via RESPIRATORY_TRACT
  Filled 2017-11-15: qty 6

## 2017-11-15 MED ORDER — TRAMADOL HCL 50 MG PO TABS
50.0000 mg | ORAL_TABLET | Freq: Once | ORAL | Status: AC
  Administered 2017-11-15: 50 mg via ORAL
  Filled 2017-11-15: qty 1

## 2017-11-15 MED ORDER — DEXTROSE 50 % IV SOLN
1.0000 | Freq: Once | INTRAVENOUS | Status: AC
  Administered 2017-11-15: 50 mL via INTRAVENOUS
  Filled 2017-11-15: qty 50

## 2017-11-15 NOTE — ED Notes (Signed)
Xray called

## 2017-11-15 NOTE — ED Notes (Signed)
Patient given orange 4 ounces of orange juice

## 2017-11-15 NOTE — ED Triage Notes (Addendum)
Pt presents to the ed with complaints of shortness of breath.  States that he has felt bad since receiving his flu shot on Friday.  Pt received 5 mg of albuterol and 125 mg of solumedrol in route with ems. Family reports increased lower extremity swelling.

## 2017-11-15 NOTE — Discharge Planning (Signed)
Laramie Meissner J. Burr Soffer, RN, BSN, NCM [phone] Spoke with pt at bedside regarding discharge planning for Home Health Services. Offered pt list of home health agencies to choose from.  Pt chose [facility] to render services. Drew of [facility] notified. Patient made aware that [facility] will be in contact in 24-48 hours.  No DME needs identified at this time.  

## 2017-11-16 NOTE — ED Provider Notes (Addendum)
[facility] EMERGENCY DEPARTMENT Provider Note   CSN: [id number] Arrival date & time: 11/15/17  2409     History   Chief Complaint Chief Complaint  Patient presents with  . Shortness of Breath    HPI [name] [name] is a 77 y.o. male.  HPI Patient is a 77 year old male presents the emergency department with increasing generalized weakness over the past several weeks.  Family reports that last week he has been unable to get up out of his power recliner.  He reports exertional shortness of breath and worsening lower extremity swelling.  And reports his swelling of his lower extremities has been much worse before in the past.  He denies fevers and chills.  Reports some cough.  Denies orthopnea.  Family reports no altered mental status.  No recent changes in medications.  Previously after a foot injury the patient had been working and family thinks he will benefit from physical therapy and additional help in the house.  Patient continues to smoke cigarettes.  His appetite has been poor.  He was found to be hypoglycemic by EMS and was given oral glucose in route.  He feels much better at this time.   Past Medical History:  Diagnosis Date  . Cancer ([place])   . Diabetes mellitus   . Hyperlipidemia   . Hypertension   . Prostate cancer [facility])     Patient Active Problem List   Diagnosis Date Noted  . Leg weakness 11/21/2015  . Lumbar canal stenosis 11/21/2015  . Open wound of heel 02/15/2015  . History of surgical procedure 11/16/2014  . Chronic obstructive pulmonary disease ([place]) 10/31/2014  . Type 2 diabetes mellitus ([place]) 10/31/2014  . BP (high blood pressure) 10/31/2014  . Acquired cavovarus deformity of foot 04/19/2014  . Acquired equinus deformity of foot 04/19/2014  . Ankle contracture 03/28/2014  . Rhabdomyolysis 07/05/2013  . COPD exacerbation ([place]) 07/05/2013  . H/O prostate cancer 07/04/2013  . Diabetes mellitus ([place]) 07/04/2013  . HTN (hypertension)  07/04/2013  . History of tobacco abuse 07/04/2013  . Fall 07/04/2013  . Thigh pain, musculoskeletal 07/04/2013  . Hypoxia 07/04/2013  . HLD (hyperlipidemia) 07/04/2013  . Depression 07/04/2013  . Chronic kidney disease 07/04/2013    Past Surgical History:  Procedure Laterality Date  . CATARACT EXTRACTION W/ INTRAOCULAR LENS IMPLANT  2007   bilateral  . FOOT SURGERY     left drop foot repair  . SHOULDER OPEN ROTATOR CUFF REPAIR  2010   left       Home Medications    Prior to Admission medications   Medication Sig Start Date End Date Taking? Authorizing Provider  albuterol (PROVENTIL) (2.5 MG/3ML) 0.083% nebulizer solution Take 2.5 mg by nebulization every 6 (six) hours as needed for wheezing or shortness of breath.    Yes [provider]  aspirin 325 MG tablet Take 325 mg by mouth daily.   Yes [provider]  atorvastatin (LIPITOR) 10 MG tablet Take 10 mg by mouth daily.   Yes [provider]  budesonide-formoterol (SYMBICORT) 160-4.5 MCG/ACT inhaler Inhale 2 puffs into the lungs 2 (two) times daily. 07/07/13  Yes Tat, Shanon Brow, MD  cholecalciferol (VITAMIN D) 1000 UNITS tablet Take 1,000 Units by mouth daily.   Yes [provider]  diazepam (VALIUM) 5 MG tablet Take 5 mg by mouth every 8 (eight) hours as needed for anxiety.  03/14/14  Yes [provider]  ferrous sulfate 325 (65 FE) MG tablet Take 325  mg by mouth daily with breakfast. Take with Calcium   Yes [provider]  finasteride (PROSCAR) 5 MG tablet Take 5 mg by mouth daily.   Yes [provider]  FLUoxetine (PROZAC) 40 MG capsule Take 60 mg by mouth every morning.    Yes [provider]  gabapentin (NEURONTIN) 300 MG capsule Take 300 mg by mouth at bedtime.   Yes [provider]  glipiZIDE (GLUCOTROL) 10 MG tablet Take 10 mg by mouth 2 (two) times daily before a meal.   Yes [provider]  hydrochlorothiazide (HYDRODIURIL) 25 MG  tablet Take 25 mg by mouth every morning.   Yes [provider]  hydroxypropyl methylcellulose (ISOPTO TEARS) 2.5 % ophthalmic solution Place 1 drop into both eyes as needed (for dryness).    Yes [provider]  lisinopril (PRINIVIL,ZESTRIL) 20 MG tablet Take 10 mg by mouth every morning.   Yes [provider]  metFORMIN (GLUCOPHAGE) 1000 MG tablet Take 1,000 mg by mouth 2 (two) times daily with a meal.   Yes [provider]  mupirocin ointment (BACTROBAN) 2 % Place 1 application into the nose 2 (two) times daily.   Yes [provider]  silver sulfADIAZINE (SILVADENE) 1 % cream Apply 1 application topically 2 (two) times daily.   Yes [provider]  tiotropium (SPIRIVA) 18 MCG inhalation capsule Place 18 mcg into inhaler and inhale daily.   Yes [provider]  oxyCODONE-acetaminophen (PERCOCET/ROXICET) 5-325 MG per tablet Take one tablet by mouth every 4 hours as needed for severe pain. Caution for altered mental status Patient not taking: Reported on 11/15/2017 09/11/14   Estill Dooms, MD  predniSONE (DELTASONE) 10 MG tablet Take 5 tablets (50 mg total) by mouth daily with breakfast. Start 07/08/13.  Decrease by one tablet daily until gone Patient not taking: Reported on 09/04/2014 07/07/13   Orson Eva, MD    Family History No family history on file.  Social History Social History   Tobacco Use  . Smoking status: Former Smoker    Last attempt to quit: 04/11/2005    Years since quitting: 12.6  . Smokeless tobacco: Never Used  Substance Use Topics  . Alcohol use: No  . Drug use: No     Allergies   Oxycodone   Review of Systems Review of Systems  All other systems reviewed and are negative.    Physical Exam Updated Vital Signs BP 129/66   Pulse (!) 109   Temp 97.6 F (36.4 C)   Resp (!) 21   Ht 5\' 11"  (1.803 m)   Wt 95.3 kg (210 lb)   SpO2 90%   BMI 29.29 kg/m    Physical Exam  Constitutional: He is  oriented to person, place, and time. He appears well-developed and well-nourished.  HENT:  Head: Normocephalic and atraumatic.  Eyes: EOM are normal.  Neck: Normal range of motion.  Cardiovascular: Normal rate, regular rhythm, normal heart sounds and intact distal pulses.  Pulmonary/Chest: Effort normal and breath sounds normal. No respiratory distress.  Abdominal: Soft. He exhibits no distension. There is no tenderness.  Musculoskeletal: Normal range of motion.  Neurological: He is alert and oriented to person, place, and time.  Skin: Skin is warm and dry.  Psychiatric: He has a normal mood and affect. Judgment normal.  Nursing note and vitals reviewed.    ED Treatments / Results  Labs (all labs ordered are listed, but only abnormal results are displayed) Labs Reviewed  CBC -  Abnormal; Notable for the following components:      Result Value   WBC 11.3 (*)    RBC 3.45 (*)    Hemoglobin 8.3 (*)    HCT 27.9 (*)    MCH 24.1 (*)    MCHC 29.7 (*)    Platelets 423 (*)    All other components within normal limits  BASIC METABOLIC PANEL - Abnormal; Notable for the following components:   Creatinine, Ser 1.36 (*)    GFR calc non Af Amer 48 (*)    GFR calc Af Amer 56 (*)    All other components within normal limits  VITAMIN B12 - Abnormal; Notable for the following components:   Vitamin B-12 104 (*)    All other components within normal limits  IRON AND TIBC - Abnormal; Notable for the following components:   Iron 10 (*)    Saturation Ratios 3 (*)    All other components within normal limits  RETICULOCYTES - Abnormal; Notable for the following components:   RBC. 3.33 (*)    All other components within normal limits  CBG MONITORING, ED - Abnormal; Notable for the following components:   Glucose-Capillary 47 (*)    All other components within normal limits  CBG MONITORING, ED - Abnormal; Notable for the following components:   Glucose-Capillary 175 (*)    All other components  within normal limits  FOLATE  FERRITIN  I-STAT TROPONIN, ED   Hemoglobin  Date Value Ref Range Status  11/15/2017 8.3 (L) 13.0 - 17.0 g/dL Final  09/04/2014 12.3 (L) 13.0 - 17.0 g/dL Final  07/04/2013 12.9 (L) 13.0 - 17.0 g/dL Final  07/04/2013 14.8 13.0 - 17.0 g/dL Final    BUN  Date Value Ref Range Status  11/15/2017 17 6 - 20 mg/dL Final  09/04/2014 16 6 - 23 mg/dL Final  07/07/2013 22 6 - 23 mg/dL Final  07/06/2013 23 6 - 23 mg/dL Final   Creatinine, Ser  Date Value Ref Range Status  11/15/2017 1.36 (H) 0.61 - 1.24 mg/dL Final  09/04/2014 1.29 0.50 - 1.35 mg/dL Final  07/07/2013 1.00 0.50 - 1.35 mg/dL Final  07/06/2013 1.08 0.50 - 1.35 mg/dL Final       EKG  EKG Interpretation  Date/Time:  Monday November 15 2017 09:48:11 EST Ventricular Rate:  96 PR Interval:    QRS Duration: 107 QT Interval:  362 QTC Calculation: 458 R Axis:   70 Text Interpretation:  normal sinus rhythm Low voltage, extremity and precordial leads Artifact in lead(s) I II III aVR aVL aVF V1 V2 Confirmed by Jola Schmidt [phone]) on 11/15/2017 11:14:15 AM       Radiology Dg Chest 2 View  Result Date: 11/15/2017 CLINICAL DATA:  Shortness of breath. EXAM: CHEST  2 VIEW COMPARISON:  09/04/2014 and 07/04/2013 FINDINGS: Heart size and pulmonary vascularity are normal. Slight atelectasis at the left lung base. The lungs are otherwise clear. No significant bone abnormality. Aortic atherosclerosis. IMPRESSION: Minimal atelectasis at the left lung base. Aortic Atherosclerosis (ICD10-I70.0). Electronically Signed   By: Lorriane Shire M.D.   On: 11/15/2017 11:36    Procedures Procedures (including critical care time)  Medications Ordered in ED Medications  albuterol (PROVENTIL) (2.5 MG/3ML) 0.083% nebulizer solution 5 mg (5 mg Nebulization Given 11/15/17 1021)  dextrose 50 % solution 50 mL (50 mLs Intravenous Given 11/15/17 1037)  traMADol (ULTRAM) tablet 50 mg (50 mg Oral Given 11/15/17 1215)      Initial Impression / Assessment and Plan /  ED Course  I have reviewed the triage vital signs and the nursing notes.  Pertinent labs & imaging results that were available during my care of the patient were reviewed by me and considered in my medical decision making (see chart for details).     Medical screening examination/treatment/procedure(s) were performed by non-physician practitioner and as supervising physician I was immediately available for consultation/collaboration.   EKG Interpretation  Date/Time:  Monday November 15 2017 09:48:11 EST Ventricular Rate:  96 PR Interval:    QRS Duration: 107 QT Interval:  362 QTC Calculation: 458 R Axis:   70 Text Interpretation:  normal sinus rhythm Low voltage, extremity and precordial leads Artifact in lead(s) I II III aVR aVL aVF V1 V2 Confirmed by Jola Schmidt [phone]) on 11/15/2017 11:14:15 AM        Final Clinical Impressions(s) / ED Diagnoses   Final diagnoses:  Weakness  Dyspnea, unspecified type    ED Discharge Orders        [place]     11/15/17 1436    Face-to-face encounter (required for Medicare/Medicaid patients)    Comments:  [place] certify that this patient is under my care and that I, or a nurse practitioner or physician's assistant working with me, had a face-to-face encounter that meets the physician face-to-face encounter requirements with this patient on 11/15/2017. The encounter with the patient was in whole, or in part for the following medical condition(s) which is the primary reason for home health care (List medical condition): weakness, lower extremity swelling   11/15/17 1436      Mild nonspecific findings found on workup here in the emergency department.  He does appear to be more anemic but is not at a level which would require blood transfusion at this time.  This will need to be further worked up by his primary care physician.  Nonspecific elevation in his BUN and  creatinine likely secondary to volume depletion.  Much of his overall course seems to be secondary to increasing deconditioning.  He will benefit from home health RN, PT, OT, aide, Education officer, museum.  If he does not begin to correct his state of deconditioning he will likely end up in assisted living or nursing home.  He understands this.  I recommended that he stop using tobacco products as well.  He will need close primary care follow-up.  Please see case management note for complete details regarding home health resources which have been provided.  Family is agreeable.  Family believes that the patient has not been putting his health as high on his list of priorities as it should be.  Patient and family are encouraged to return to the emergency department for new or worsening symptoms.  Patient will need repeat hemoglobin within a week.  Jola Schmidt, MD 11/16/17 1639   Jola Schmidt, MD 11/16/17 [phone]

## 2017-11-17 DIAGNOSIS — F329 Major depressive disorder, single episode, unspecified: Secondary | ICD-10-CM | POA: Diagnosis not present

## 2017-11-17 DIAGNOSIS — E7849 Other hyperlipidemia: Secondary | ICD-10-CM | POA: Diagnosis not present

## 2017-11-17 DIAGNOSIS — E785 Hyperlipidemia, unspecified: Secondary | ICD-10-CM | POA: Diagnosis not present

## 2017-11-17 DIAGNOSIS — Z7982 Long term (current) use of aspirin: Secondary | ICD-10-CM | POA: Diagnosis not present

## 2017-11-17 DIAGNOSIS — N189 Chronic kidney disease, unspecified: Secondary | ICD-10-CM | POA: Diagnosis not present

## 2017-11-17 DIAGNOSIS — J449 Chronic obstructive pulmonary disease, unspecified: Secondary | ICD-10-CM | POA: Diagnosis not present

## 2017-11-17 DIAGNOSIS — Z7984 Long term (current) use of oral hypoglycemic drugs: Secondary | ICD-10-CM | POA: Diagnosis not present

## 2017-11-17 DIAGNOSIS — E1122 Type 2 diabetes mellitus with diabetic chronic kidney disease: Secondary | ICD-10-CM | POA: Diagnosis not present

## 2017-11-17 DIAGNOSIS — F3289 Other specified depressive episodes: Secondary | ICD-10-CM | POA: Diagnosis not present

## 2017-11-17 DIAGNOSIS — I129 Hypertensive chronic kidney disease with stage 1 through stage 4 chronic kidney disease, or unspecified chronic kidney disease: Secondary | ICD-10-CM | POA: Diagnosis not present

## 2017-11-17 DIAGNOSIS — Z7951 Long term (current) use of inhaled steroids: Secondary | ICD-10-CM | POA: Diagnosis not present

## 2017-11-18 DIAGNOSIS — I129 Hypertensive chronic kidney disease with stage 1 through stage 4 chronic kidney disease, or unspecified chronic kidney disease: Secondary | ICD-10-CM | POA: Diagnosis not present

## 2017-11-18 DIAGNOSIS — E785 Hyperlipidemia, unspecified: Secondary | ICD-10-CM | POA: Diagnosis not present

## 2017-11-18 DIAGNOSIS — Z7984 Long term (current) use of oral hypoglycemic drugs: Secondary | ICD-10-CM | POA: Diagnosis not present

## 2017-11-18 DIAGNOSIS — J449 Chronic obstructive pulmonary disease, unspecified: Secondary | ICD-10-CM | POA: Diagnosis not present

## 2017-11-18 DIAGNOSIS — Z7982 Long term (current) use of aspirin: Secondary | ICD-10-CM | POA: Diagnosis not present

## 2017-11-18 DIAGNOSIS — F329 Major depressive disorder, single episode, unspecified: Secondary | ICD-10-CM | POA: Diagnosis not present

## 2017-11-18 DIAGNOSIS — Z7951 Long term (current) use of inhaled steroids: Secondary | ICD-10-CM | POA: Diagnosis not present

## 2017-11-18 DIAGNOSIS — N189 Chronic kidney disease, unspecified: Secondary | ICD-10-CM | POA: Diagnosis not present

## 2017-11-18 DIAGNOSIS — E1122 Type 2 diabetes mellitus with diabetic chronic kidney disease: Secondary | ICD-10-CM | POA: Diagnosis not present

## 2017-11-22 DIAGNOSIS — Z7982 Long term (current) use of aspirin: Secondary | ICD-10-CM | POA: Diagnosis not present

## 2017-11-22 DIAGNOSIS — E785 Hyperlipidemia, unspecified: Secondary | ICD-10-CM | POA: Diagnosis not present

## 2017-11-22 DIAGNOSIS — E1122 Type 2 diabetes mellitus with diabetic chronic kidney disease: Secondary | ICD-10-CM | POA: Diagnosis not present

## 2017-11-22 DIAGNOSIS — J449 Chronic obstructive pulmonary disease, unspecified: Secondary | ICD-10-CM | POA: Diagnosis not present

## 2017-11-22 DIAGNOSIS — F329 Major depressive disorder, single episode, unspecified: Secondary | ICD-10-CM | POA: Diagnosis not present

## 2017-11-22 DIAGNOSIS — Z7984 Long term (current) use of oral hypoglycemic drugs: Secondary | ICD-10-CM | POA: Diagnosis not present

## 2017-11-22 DIAGNOSIS — Z7951 Long term (current) use of inhaled steroids: Secondary | ICD-10-CM | POA: Diagnosis not present

## 2017-11-22 DIAGNOSIS — N189 Chronic kidney disease, unspecified: Secondary | ICD-10-CM | POA: Diagnosis not present

## 2017-11-22 DIAGNOSIS — R0902 Hypoxemia: Secondary | ICD-10-CM | POA: Diagnosis not present

## 2017-11-22 DIAGNOSIS — I129 Hypertensive chronic kidney disease with stage 1 through stage 4 chronic kidney disease, or unspecified chronic kidney disease: Secondary | ICD-10-CM | POA: Diagnosis not present

## 2017-11-23 DIAGNOSIS — C61 Malignant neoplasm of prostate: Secondary | ICD-10-CM | POA: Diagnosis not present

## 2017-11-23 DIAGNOSIS — F329 Major depressive disorder, single episode, unspecified: Secondary | ICD-10-CM | POA: Diagnosis not present

## 2017-11-23 DIAGNOSIS — I129 Hypertensive chronic kidney disease with stage 1 through stage 4 chronic kidney disease, or unspecified chronic kidney disease: Secondary | ICD-10-CM | POA: Diagnosis not present

## 2017-11-23 DIAGNOSIS — Z7951 Long term (current) use of inhaled steroids: Secondary | ICD-10-CM | POA: Diagnosis not present

## 2017-11-23 DIAGNOSIS — Z7984 Long term (current) use of oral hypoglycemic drugs: Secondary | ICD-10-CM | POA: Diagnosis not present

## 2017-11-23 DIAGNOSIS — N189 Chronic kidney disease, unspecified: Secondary | ICD-10-CM | POA: Diagnosis not present

## 2017-11-23 DIAGNOSIS — Z7982 Long term (current) use of aspirin: Secondary | ICD-10-CM | POA: Diagnosis not present

## 2017-11-23 DIAGNOSIS — E785 Hyperlipidemia, unspecified: Secondary | ICD-10-CM | POA: Diagnosis not present

## 2017-11-23 DIAGNOSIS — M21372 Foot drop, left foot: Secondary | ICD-10-CM | POA: Diagnosis not present

## 2017-11-23 DIAGNOSIS — J449 Chronic obstructive pulmonary disease, unspecified: Secondary | ICD-10-CM | POA: Diagnosis not present

## 2017-11-23 DIAGNOSIS — E1122 Type 2 diabetes mellitus with diabetic chronic kidney disease: Secondary | ICD-10-CM | POA: Diagnosis not present

## 2017-11-23 DIAGNOSIS — I10 Essential (primary) hypertension: Secondary | ICD-10-CM | POA: Diagnosis not present

## 2017-11-25 DIAGNOSIS — Z7984 Long term (current) use of oral hypoglycemic drugs: Secondary | ICD-10-CM | POA: Diagnosis not present

## 2017-11-25 DIAGNOSIS — Z7951 Long term (current) use of inhaled steroids: Secondary | ICD-10-CM | POA: Diagnosis not present

## 2017-11-25 DIAGNOSIS — N189 Chronic kidney disease, unspecified: Secondary | ICD-10-CM | POA: Diagnosis not present

## 2017-11-25 DIAGNOSIS — E1122 Type 2 diabetes mellitus with diabetic chronic kidney disease: Secondary | ICD-10-CM | POA: Diagnosis not present

## 2017-11-25 DIAGNOSIS — I129 Hypertensive chronic kidney disease with stage 1 through stage 4 chronic kidney disease, or unspecified chronic kidney disease: Secondary | ICD-10-CM | POA: Diagnosis not present

## 2017-11-25 DIAGNOSIS — Z7982 Long term (current) use of aspirin: Secondary | ICD-10-CM | POA: Diagnosis not present

## 2017-11-25 DIAGNOSIS — E785 Hyperlipidemia, unspecified: Secondary | ICD-10-CM | POA: Diagnosis not present

## 2017-11-25 DIAGNOSIS — J449 Chronic obstructive pulmonary disease, unspecified: Secondary | ICD-10-CM | POA: Diagnosis not present

## 2017-11-25 DIAGNOSIS — F329 Major depressive disorder, single episode, unspecified: Secondary | ICD-10-CM | POA: Diagnosis not present

## 2017-11-30 DIAGNOSIS — Z7982 Long term (current) use of aspirin: Secondary | ICD-10-CM | POA: Diagnosis not present

## 2017-11-30 DIAGNOSIS — F329 Major depressive disorder, single episode, unspecified: Secondary | ICD-10-CM | POA: Diagnosis not present

## 2017-11-30 DIAGNOSIS — E785 Hyperlipidemia, unspecified: Secondary | ICD-10-CM | POA: Diagnosis not present

## 2017-11-30 DIAGNOSIS — N189 Chronic kidney disease, unspecified: Secondary | ICD-10-CM | POA: Diagnosis not present

## 2017-11-30 DIAGNOSIS — I129 Hypertensive chronic kidney disease with stage 1 through stage 4 chronic kidney disease, or unspecified chronic kidney disease: Secondary | ICD-10-CM | POA: Diagnosis not present

## 2017-11-30 DIAGNOSIS — Z7951 Long term (current) use of inhaled steroids: Secondary | ICD-10-CM | POA: Diagnosis not present

## 2017-11-30 DIAGNOSIS — Z7984 Long term (current) use of oral hypoglycemic drugs: Secondary | ICD-10-CM | POA: Diagnosis not present

## 2017-11-30 DIAGNOSIS — E1122 Type 2 diabetes mellitus with diabetic chronic kidney disease: Secondary | ICD-10-CM | POA: Diagnosis not present

## 2017-11-30 DIAGNOSIS — J449 Chronic obstructive pulmonary disease, unspecified: Secondary | ICD-10-CM | POA: Diagnosis not present

## 2017-12-02 DIAGNOSIS — J449 Chronic obstructive pulmonary disease, unspecified: Secondary | ICD-10-CM | POA: Diagnosis not present

## 2017-12-03 DIAGNOSIS — F329 Major depressive disorder, single episode, unspecified: Secondary | ICD-10-CM | POA: Diagnosis not present

## 2017-12-03 DIAGNOSIS — Z7951 Long term (current) use of inhaled steroids: Secondary | ICD-10-CM | POA: Diagnosis not present

## 2017-12-03 DIAGNOSIS — J449 Chronic obstructive pulmonary disease, unspecified: Secondary | ICD-10-CM | POA: Diagnosis not present

## 2017-12-03 DIAGNOSIS — Z7984 Long term (current) use of oral hypoglycemic drugs: Secondary | ICD-10-CM | POA: Diagnosis not present

## 2017-12-03 DIAGNOSIS — Z7982 Long term (current) use of aspirin: Secondary | ICD-10-CM | POA: Diagnosis not present

## 2017-12-03 DIAGNOSIS — E785 Hyperlipidemia, unspecified: Secondary | ICD-10-CM | POA: Diagnosis not present

## 2017-12-03 DIAGNOSIS — N189 Chronic kidney disease, unspecified: Secondary | ICD-10-CM | POA: Diagnosis not present

## 2017-12-03 DIAGNOSIS — I129 Hypertensive chronic kidney disease with stage 1 through stage 4 chronic kidney disease, or unspecified chronic kidney disease: Secondary | ICD-10-CM | POA: Diagnosis not present

## 2017-12-03 DIAGNOSIS — E1122 Type 2 diabetes mellitus with diabetic chronic kidney disease: Secondary | ICD-10-CM | POA: Diagnosis not present

## 2017-12-06 DIAGNOSIS — Z7951 Long term (current) use of inhaled steroids: Secondary | ICD-10-CM | POA: Diagnosis not present

## 2017-12-06 DIAGNOSIS — Z7982 Long term (current) use of aspirin: Secondary | ICD-10-CM | POA: Diagnosis not present

## 2017-12-06 DIAGNOSIS — J449 Chronic obstructive pulmonary disease, unspecified: Secondary | ICD-10-CM | POA: Diagnosis not present

## 2017-12-06 DIAGNOSIS — E1122 Type 2 diabetes mellitus with diabetic chronic kidney disease: Secondary | ICD-10-CM | POA: Diagnosis not present

## 2017-12-06 DIAGNOSIS — E785 Hyperlipidemia, unspecified: Secondary | ICD-10-CM | POA: Diagnosis not present

## 2017-12-06 DIAGNOSIS — N189 Chronic kidney disease, unspecified: Secondary | ICD-10-CM | POA: Diagnosis not present

## 2017-12-06 DIAGNOSIS — Z7984 Long term (current) use of oral hypoglycemic drugs: Secondary | ICD-10-CM | POA: Diagnosis not present

## 2017-12-06 DIAGNOSIS — F329 Major depressive disorder, single episode, unspecified: Secondary | ICD-10-CM | POA: Diagnosis not present

## 2017-12-06 DIAGNOSIS — I129 Hypertensive chronic kidney disease with stage 1 through stage 4 chronic kidney disease, or unspecified chronic kidney disease: Secondary | ICD-10-CM | POA: Diagnosis not present

## 2017-12-07 DIAGNOSIS — E785 Hyperlipidemia, unspecified: Secondary | ICD-10-CM | POA: Diagnosis not present

## 2017-12-07 DIAGNOSIS — I129 Hypertensive chronic kidney disease with stage 1 through stage 4 chronic kidney disease, or unspecified chronic kidney disease: Secondary | ICD-10-CM | POA: Diagnosis not present

## 2017-12-07 DIAGNOSIS — Z7984 Long term (current) use of oral hypoglycemic drugs: Secondary | ICD-10-CM | POA: Diagnosis not present

## 2017-12-07 DIAGNOSIS — Z7982 Long term (current) use of aspirin: Secondary | ICD-10-CM | POA: Diagnosis not present

## 2017-12-07 DIAGNOSIS — E1122 Type 2 diabetes mellitus with diabetic chronic kidney disease: Secondary | ICD-10-CM | POA: Diagnosis not present

## 2017-12-07 DIAGNOSIS — F329 Major depressive disorder, single episode, unspecified: Secondary | ICD-10-CM | POA: Diagnosis not present

## 2017-12-07 DIAGNOSIS — N189 Chronic kidney disease, unspecified: Secondary | ICD-10-CM | POA: Diagnosis not present

## 2017-12-07 DIAGNOSIS — J449 Chronic obstructive pulmonary disease, unspecified: Secondary | ICD-10-CM | POA: Diagnosis not present

## 2017-12-07 DIAGNOSIS — Z7951 Long term (current) use of inhaled steroids: Secondary | ICD-10-CM | POA: Diagnosis not present

## 2017-12-09 DIAGNOSIS — N189 Chronic kidney disease, unspecified: Secondary | ICD-10-CM | POA: Diagnosis not present

## 2017-12-09 DIAGNOSIS — I129 Hypertensive chronic kidney disease with stage 1 through stage 4 chronic kidney disease, or unspecified chronic kidney disease: Secondary | ICD-10-CM | POA: Diagnosis not present

## 2017-12-09 DIAGNOSIS — E1122 Type 2 diabetes mellitus with diabetic chronic kidney disease: Secondary | ICD-10-CM | POA: Diagnosis not present

## 2017-12-09 DIAGNOSIS — Z7984 Long term (current) use of oral hypoglycemic drugs: Secondary | ICD-10-CM | POA: Diagnosis not present

## 2017-12-09 DIAGNOSIS — Z7982 Long term (current) use of aspirin: Secondary | ICD-10-CM | POA: Diagnosis not present

## 2017-12-09 DIAGNOSIS — J449 Chronic obstructive pulmonary disease, unspecified: Secondary | ICD-10-CM | POA: Diagnosis not present

## 2017-12-09 DIAGNOSIS — F329 Major depressive disorder, single episode, unspecified: Secondary | ICD-10-CM | POA: Diagnosis not present

## 2017-12-09 DIAGNOSIS — E785 Hyperlipidemia, unspecified: Secondary | ICD-10-CM | POA: Diagnosis not present

## 2017-12-09 DIAGNOSIS — Z7951 Long term (current) use of inhaled steroids: Secondary | ICD-10-CM | POA: Diagnosis not present

## 2017-12-13 DIAGNOSIS — Z7951 Long term (current) use of inhaled steroids: Secondary | ICD-10-CM | POA: Diagnosis not present

## 2017-12-13 DIAGNOSIS — I129 Hypertensive chronic kidney disease with stage 1 through stage 4 chronic kidney disease, or unspecified chronic kidney disease: Secondary | ICD-10-CM | POA: Diagnosis not present

## 2017-12-13 DIAGNOSIS — Z7982 Long term (current) use of aspirin: Secondary | ICD-10-CM | POA: Diagnosis not present

## 2017-12-13 DIAGNOSIS — E785 Hyperlipidemia, unspecified: Secondary | ICD-10-CM | POA: Diagnosis not present

## 2017-12-13 DIAGNOSIS — F329 Major depressive disorder, single episode, unspecified: Secondary | ICD-10-CM | POA: Diagnosis not present

## 2017-12-13 DIAGNOSIS — E1122 Type 2 diabetes mellitus with diabetic chronic kidney disease: Secondary | ICD-10-CM | POA: Diagnosis not present

## 2017-12-13 DIAGNOSIS — J449 Chronic obstructive pulmonary disease, unspecified: Secondary | ICD-10-CM | POA: Diagnosis not present

## 2017-12-13 DIAGNOSIS — Z7984 Long term (current) use of oral hypoglycemic drugs: Secondary | ICD-10-CM | POA: Diagnosis not present

## 2017-12-13 DIAGNOSIS — N189 Chronic kidney disease, unspecified: Secondary | ICD-10-CM | POA: Diagnosis not present

## 2017-12-14 DIAGNOSIS — N189 Chronic kidney disease, unspecified: Secondary | ICD-10-CM | POA: Diagnosis not present

## 2017-12-14 DIAGNOSIS — Z7951 Long term (current) use of inhaled steroids: Secondary | ICD-10-CM | POA: Diagnosis not present

## 2017-12-14 DIAGNOSIS — Z7984 Long term (current) use of oral hypoglycemic drugs: Secondary | ICD-10-CM | POA: Diagnosis not present

## 2017-12-14 DIAGNOSIS — E1122 Type 2 diabetes mellitus with diabetic chronic kidney disease: Secondary | ICD-10-CM | POA: Diagnosis not present

## 2017-12-14 DIAGNOSIS — I129 Hypertensive chronic kidney disease with stage 1 through stage 4 chronic kidney disease, or unspecified chronic kidney disease: Secondary | ICD-10-CM | POA: Diagnosis not present

## 2017-12-14 DIAGNOSIS — Z7982 Long term (current) use of aspirin: Secondary | ICD-10-CM | POA: Diagnosis not present

## 2017-12-14 DIAGNOSIS — F329 Major depressive disorder, single episode, unspecified: Secondary | ICD-10-CM | POA: Diagnosis not present

## 2017-12-14 DIAGNOSIS — J449 Chronic obstructive pulmonary disease, unspecified: Secondary | ICD-10-CM | POA: Diagnosis not present

## 2017-12-14 DIAGNOSIS — E785 Hyperlipidemia, unspecified: Secondary | ICD-10-CM | POA: Diagnosis not present

## 2017-12-16 DIAGNOSIS — E161 Other hypoglycemia: Secondary | ICD-10-CM | POA: Diagnosis not present

## 2017-12-16 DIAGNOSIS — E162 Hypoglycemia, unspecified: Secondary | ICD-10-CM | POA: Diagnosis not present

## 2017-12-17 ENCOUNTER — Observation Stay (HOSPITAL_BASED_OUTPATIENT_CLINIC_OR_DEPARTMENT_OTHER): Payer: Medicare HMO

## 2017-12-17 ENCOUNTER — Emergency Department (HOSPITAL_COMMUNITY): Payer: Medicare HMO

## 2017-12-17 ENCOUNTER — Encounter (HOSPITAL_COMMUNITY): Payer: Self-pay

## 2017-12-17 ENCOUNTER — Observation Stay (HOSPITAL_COMMUNITY): Payer: Medicare HMO

## 2017-12-17 ENCOUNTER — Observation Stay (HOSPITAL_COMMUNITY)
Admission: EM | Admit: 2017-12-17 | Discharge: 2017-12-20 | Disposition: A | Discharge status: Home / Self Care | Payer: Medicare HMO | Attending: Internal Medicine | Admitting: Internal Medicine

## 2017-12-17 DIAGNOSIS — E1122 Type 2 diabetes mellitus with diabetic chronic kidney disease: Secondary | ICD-10-CM | POA: Diagnosis not present

## 2017-12-17 DIAGNOSIS — Z87891 Personal history of nicotine dependence: Secondary | ICD-10-CM | POA: Diagnosis not present

## 2017-12-17 DIAGNOSIS — D631 Anemia in chronic kidney disease: Secondary | ICD-10-CM | POA: Diagnosis not present

## 2017-12-17 DIAGNOSIS — N182 Chronic kidney disease, stage 2 (mild): Secondary | ICD-10-CM | POA: Diagnosis not present

## 2017-12-17 DIAGNOSIS — I10 Essential (primary) hypertension: Secondary | ICD-10-CM | POA: Diagnosis present

## 2017-12-17 DIAGNOSIS — F329 Major depressive disorder, single episode, unspecified: Secondary | ICD-10-CM | POA: Diagnosis not present

## 2017-12-17 DIAGNOSIS — F419 Anxiety disorder, unspecified: Secondary | ICD-10-CM | POA: Diagnosis not present

## 2017-12-17 DIAGNOSIS — K921 Melena: Secondary | ICD-10-CM

## 2017-12-17 DIAGNOSIS — N183 Chronic kidney disease, stage 3 (moderate): Secondary | ICD-10-CM | POA: Diagnosis not present

## 2017-12-17 DIAGNOSIS — Z7982 Long term (current) use of aspirin: Secondary | ICD-10-CM | POA: Diagnosis not present

## 2017-12-17 DIAGNOSIS — R918 Other nonspecific abnormal finding of lung field: Secondary | ICD-10-CM | POA: Diagnosis not present

## 2017-12-17 DIAGNOSIS — E860 Dehydration: Secondary | ICD-10-CM | POA: Diagnosis not present

## 2017-12-17 DIAGNOSIS — Z79899 Other long term (current) drug therapy: Secondary | ICD-10-CM | POA: Insufficient documentation

## 2017-12-17 DIAGNOSIS — J439 Emphysema, unspecified: Secondary | ICD-10-CM | POA: Insufficient documentation

## 2017-12-17 DIAGNOSIS — Z9981 Dependence on supplemental oxygen: Secondary | ICD-10-CM | POA: Insufficient documentation

## 2017-12-17 DIAGNOSIS — E11649 Type 2 diabetes mellitus with hypoglycemia without coma: Principal | ICD-10-CM | POA: Insufficient documentation

## 2017-12-17 DIAGNOSIS — Z7984 Long term (current) use of oral hypoglycemic drugs: Secondary | ICD-10-CM | POA: Diagnosis not present

## 2017-12-17 DIAGNOSIS — R600 Localized edema: Secondary | ICD-10-CM | POA: Insufficient documentation

## 2017-12-17 DIAGNOSIS — D509 Iron deficiency anemia, unspecified: Secondary | ICD-10-CM | POA: Insufficient documentation

## 2017-12-17 DIAGNOSIS — Z8546 Personal history of malignant neoplasm of prostate: Secondary | ICD-10-CM

## 2017-12-17 DIAGNOSIS — I129 Hypertensive chronic kidney disease with stage 1 through stage 4 chronic kidney disease, or unspecified chronic kidney disease: Secondary | ICD-10-CM | POA: Diagnosis not present

## 2017-12-17 DIAGNOSIS — I509 Heart failure, unspecified: Secondary | ICD-10-CM | POA: Diagnosis not present

## 2017-12-17 DIAGNOSIS — F320 Major depressive disorder, single episode, mild: Secondary | ICD-10-CM

## 2017-12-17 DIAGNOSIS — D519 Vitamin B12 deficiency anemia, unspecified: Secondary | ICD-10-CM | POA: Insufficient documentation

## 2017-12-17 DIAGNOSIS — E162 Hypoglycemia, unspecified: Secondary | ICD-10-CM | POA: Diagnosis present

## 2017-12-17 DIAGNOSIS — E785 Hyperlipidemia, unspecified: Secondary | ICD-10-CM | POA: Diagnosis not present

## 2017-12-17 DIAGNOSIS — Z885 Allergy status to narcotic agent status: Secondary | ICD-10-CM | POA: Diagnosis not present

## 2017-12-17 DIAGNOSIS — R05 Cough: Secondary | ICD-10-CM | POA: Diagnosis not present

## 2017-12-17 DIAGNOSIS — I700 Atherosclerosis of aorta: Secondary | ICD-10-CM | POA: Insufficient documentation

## 2017-12-17 DIAGNOSIS — R740 Nonspecific elevation of levels of transaminase and lactic acid dehydrogenase [LDH]: Secondary | ICD-10-CM | POA: Diagnosis not present

## 2017-12-17 DIAGNOSIS — R5383 Other fatigue: Secondary | ICD-10-CM | POA: Diagnosis not present

## 2017-12-17 DIAGNOSIS — E1165 Type 2 diabetes mellitus with hyperglycemia: Secondary | ICD-10-CM | POA: Insufficient documentation

## 2017-12-17 DIAGNOSIS — E119 Type 2 diabetes mellitus without complications: Secondary | ICD-10-CM

## 2017-12-17 DIAGNOSIS — N189 Chronic kidney disease, unspecified: Secondary | ICD-10-CM | POA: Diagnosis present

## 2017-12-17 DIAGNOSIS — J189 Pneumonia, unspecified organism: Secondary | ICD-10-CM | POA: Insufficient documentation

## 2017-12-17 DIAGNOSIS — F32A Depression, unspecified: Secondary | ICD-10-CM | POA: Diagnosis present

## 2017-12-17 LAB — COMPREHENSIVE METABOLIC PANEL
ALK PHOS: 74 U/L (ref 38–126)
ALT: 8 U/L — AB (ref 17–63)
AST: 19 U/L (ref 15–41)
Albumin: 2.6 g/dL — ABNORMAL LOW (ref 3.5–5.0)
Anion gap: 15 (ref 5–15)
BILIRUBIN TOTAL: 0.5 mg/dL (ref 0.3–1.2)
BUN: 21 mg/dL — AB (ref 6–20)
CALCIUM: 8.8 mg/dL — AB (ref 8.9–10.3)
CO2: 24 mmol/L (ref 22–32)
CREATININE: 1.55 mg/dL — AB (ref 0.61–1.24)
Chloride: 98 mmol/L — ABNORMAL LOW (ref 101–111)
GFR, EST AFRICAN AMERICAN: 48 mL/min — AB (ref >60)
GFR, EST NON AFRICAN AMERICAN: 41 mL/min — AB (ref >60)
Glucose, Bld: 141 mg/dL — ABNORMAL HIGH (ref 65–99)
Potassium: 4.3 mmol/L (ref 3.5–5.1)
Sodium: 137 mmol/L (ref 135–145)
TOTAL PROTEIN: 6.7 g/dL (ref 6.5–8.1)

## 2017-12-17 LAB — CBC WITH DIFFERENTIAL/PLATELET
BASOS ABS: 0 10*3/uL (ref 0.0–0.1)
Basophils Relative: 0 %
EOS PCT: 4 %
Eosinophils Absolute: 0.5 10*3/uL (ref 0.0–0.7)
HEMATOCRIT: 27.5 % — AB (ref 39.0–52.0)
Hemoglobin: 8.2 g/dL — ABNORMAL LOW (ref 13.0–17.0)
Lymphocytes Relative: 9 %
Lymphs Abs: 1.4 10*3/uL (ref 0.7–4.0)
MCH: 24 pg — ABNORMAL LOW (ref 26.0–34.0)
MCHC: 29.8 g/dL — AB (ref 30.0–36.0)
MCV: 80.6 fL (ref 78.0–100.0)
MONO ABS: 0.6 10*3/uL (ref 0.1–1.0)
MONOS PCT: 4 %
NEUTROS ABS: 12.6 10*3/uL — AB (ref 1.7–7.7)
Neutrophils Relative %: 83 %
PLATELETS: 418 10*3/uL — AB (ref 150–400)
RBC: 3.41 MIL/uL — ABNORMAL LOW (ref 4.22–5.81)
RDW: 16.2 % — AB (ref 11.5–15.5)
WBC: 15.2 10*3/uL — ABNORMAL HIGH (ref 4.0–10.5)

## 2017-12-17 LAB — CBG MONITORING, ED
GLUCOSE-CAPILLARY: 252 mg/dL — AB (ref 65–99)
Glucose-Capillary: 135 mg/dL — ABNORMAL HIGH (ref 65–99)
Glucose-Capillary: 149 mg/dL — ABNORMAL HIGH (ref 65–99)
Glucose-Capillary: 184 mg/dL — ABNORMAL HIGH (ref 65–99)
Glucose-Capillary: 244 mg/dL — ABNORMAL HIGH (ref 65–99)
Glucose-Capillary: 257 mg/dL — ABNORMAL HIGH (ref 65–99)
Glucose-Capillary: 305 mg/dL — ABNORMAL HIGH (ref 65–99)
Glucose-Capillary: 348 mg/dL — ABNORMAL HIGH (ref 65–99)

## 2017-12-17 LAB — IRON AND TIBC
Iron: 108 ug/dL (ref 45–182)
Saturation Ratios: 41 % — ABNORMAL HIGH (ref 17.9–39.5)
TIBC: 260 ug/dL (ref 250–450)
UIBC: 152 ug/dL

## 2017-12-17 LAB — URINALYSIS, ROUTINE W REFLEX MICROSCOPIC
Bilirubin Urine: NEGATIVE
GLUCOSE, UA: NEGATIVE mg/dL
HGB URINE DIPSTICK: NEGATIVE
KETONES UR: NEGATIVE mg/dL
Leukocytes, UA: NEGATIVE
Nitrite: NEGATIVE
PH: 5 (ref 5.0–8.0)
PROTEIN: NEGATIVE mg/dL
Specific Gravity, Urine: 1.011 (ref 1.005–1.030)

## 2017-12-17 LAB — RETICULOCYTES
RBC.: 3.33 MIL/uL — AB (ref 4.22–5.81)
RETIC CT PCT: 1.6 % (ref 0.4–3.1)
Retic Count, Absolute: 53.3 10*3/uL (ref 19.0–186.0)

## 2017-12-17 LAB — TSH: TSH: 0.195 u[IU]/mL — ABNORMAL LOW (ref 0.350–4.500)

## 2017-12-17 LAB — FOLATE: Folate: 7.1 ng/mL (ref >5.9)

## 2017-12-17 LAB — POC OCCULT BLOOD, ED: FECAL OCCULT BLD: NEGATIVE

## 2017-12-17 LAB — LACTIC ACID, PLASMA
Lactic Acid, Venous: 2.3 mmol/L (ref 0.5–1.9)
Lactic Acid, Venous: 2.9 mmol/L (ref 0.5–1.9)
Lactic Acid, Venous: 1 mmol/L (ref 0.5–1.9)

## 2017-12-17 LAB — VITAMIN B12: Vitamin B-12: 143 pg/mL — ABNORMAL LOW (ref 180–914)

## 2017-12-17 LAB — PROTIME-INR
INR: 1.08
Prothrombin Time: 13.9 seconds (ref 11.4–15.2)

## 2017-12-17 LAB — GLUCOSE, CAPILLARY: Glucose-Capillary: 325 mg/dL — ABNORMAL HIGH (ref 65–99)

## 2017-12-17 LAB — SAVE SMEAR

## 2017-12-17 LAB — ABO/RH: ABO/RH(D): A NEG

## 2017-12-17 LAB — HEMOGLOBIN
HEMOGLOBIN: 8.1 g/dL — AB (ref 13.0–17.0)
Hemoglobin: 8.1 g/dL — ABNORMAL LOW (ref 13.0–17.0)
Hemoglobin: 8 g/dL — ABNORMAL LOW (ref 13.0–17.0)

## 2017-12-17 LAB — FERRITIN: FERRITIN: 86 ng/mL (ref 24–336)

## 2017-12-17 LAB — ECHOCARDIOGRAM COMPLETE

## 2017-12-17 LAB — HEMOGLOBIN AND HEMATOCRIT, BLOOD
HCT: 24.7 % — ABNORMAL LOW (ref 39.0–52.0)
Hemoglobin: 7.3 g/dL — ABNORMAL LOW (ref 13.0–17.0)

## 2017-12-17 LAB — PROCALCITONIN: PROCALCITONIN: 0.22 ng/mL

## 2017-12-17 LAB — TROPONIN I

## 2017-12-17 LAB — BRAIN NATRIURETIC PEPTIDE: B NATRIURETIC PEPTIDE 5: 52.9 pg/mL (ref 0.0–100.0)

## 2017-12-17 LAB — PREPARE RBC (CROSSMATCH)

## 2017-12-17 MED ORDER — TIOTROPIUM BROMIDE MONOHYDRATE 18 MCG IN CAPS
18.0000 ug | ORAL_CAPSULE | Freq: Every day | RESPIRATORY_TRACT | Status: DC
  Filled 2017-12-17: qty 5

## 2017-12-17 MED ORDER — FLUOXETINE HCL 20 MG PO CAPS
60.0000 mg | ORAL_CAPSULE | Freq: Every morning | ORAL | Status: DC
  Administered 2017-12-17 – 2017-12-20 (×4): 60 mg via ORAL
  Filled 2017-12-17 (×4): qty 3

## 2017-12-17 MED ORDER — SODIUM CHLORIDE 0.9 % IV SOLN: Freq: Once | INTRAVENOUS | Status: DC

## 2017-12-17 MED ORDER — FERROUS SULFATE 325 (65 FE) MG PO TABS
325.0000 mg | ORAL_TABLET | Freq: Every day | ORAL | Status: DC
  Administered 2017-12-17 – 2017-12-18 (×2): 325 mg via ORAL
  Filled 2017-12-17 (×2): qty 1

## 2017-12-17 MED ORDER — MUPIROCIN 2 % EX OINT
1.0000 "application " | TOPICAL_OINTMENT | Freq: Two times a day (BID) | CUTANEOUS | Status: DC
  Administered 2017-12-17 – 2017-12-20 (×4): 1 via NASAL
  Filled 2017-12-17 (×2): qty 22

## 2017-12-17 MED ORDER — METHYLPREDNISOLONE SODIUM SUCC 125 MG IJ SOLR
125.0000 mg | Freq: Once | INTRAMUSCULAR | Status: AC
  Administered 2017-12-17: 125 mg via INTRAVENOUS
  Filled 2017-12-17: qty 2

## 2017-12-17 MED ORDER — ONDANSETRON HCL 4 MG PO TABS: 4.0000 mg | ORAL_TABLET | Freq: Four times a day (QID) | ORAL | Status: DC | PRN

## 2017-12-17 MED ORDER — ACETAMINOPHEN 650 MG RE SUPP: 650.0000 mg | Freq: Four times a day (QID) | RECTAL | Status: DC | PRN

## 2017-12-17 MED ORDER — GABAPENTIN 300 MG PO CAPS
300.0000 mg | ORAL_CAPSULE | Freq: Every day | ORAL | Status: DC
  Administered 2017-12-17 – 2017-12-19 (×3): 300 mg via ORAL
  Filled 2017-12-17 (×3): qty 1

## 2017-12-17 MED ORDER — ONDANSETRON HCL 4 MG/2ML IJ SOLN: 4.0000 mg | Freq: Four times a day (QID) | INTRAMUSCULAR | Status: DC | PRN

## 2017-12-17 MED ORDER — IPRATROPIUM-ALBUTEROL 0.5-2.5 (3) MG/3ML IN SOLN: 3.0000 mL | RESPIRATORY_TRACT | Status: DC | PRN

## 2017-12-17 MED ORDER — FUROSEMIDE 40 MG PO TABS: 40.0000 mg | ORAL_TABLET | Freq: Every day | ORAL | Status: DC

## 2017-12-17 MED ORDER — HYPROMELLOSE (GONIOSCOPIC) 2.5 % OP SOLN
1.0000 [drp] | OPHTHALMIC | Status: DC | PRN
  Filled 2017-12-17: qty 15

## 2017-12-17 MED ORDER — ATORVASTATIN CALCIUM 10 MG PO TABS
10.0000 mg | ORAL_TABLET | Freq: Every day | ORAL | Status: DC
  Administered 2017-12-17 – 2017-12-19 (×3): 10 mg via ORAL
  Filled 2017-12-17 (×3): qty 1

## 2017-12-17 MED ORDER — ACETAMINOPHEN 325 MG PO TABS: 650.0000 mg | ORAL_TABLET | Freq: Four times a day (QID) | ORAL | Status: DC | PRN

## 2017-12-17 MED ORDER — VITAMIN D 1000 UNITS PO TABS
1000.0000 [IU] | ORAL_TABLET | Freq: Every day | ORAL | Status: DC
  Administered 2017-12-17 – 2017-12-20 (×4): 1000 [IU] via ORAL
  Filled 2017-12-17 (×4): qty 1

## 2017-12-17 MED ORDER — DIAZEPAM 5 MG PO TABS: 5.0000 mg | ORAL_TABLET | Freq: Three times a day (TID) | ORAL | Status: DC | PRN

## 2017-12-17 MED ORDER — SENNOSIDES-DOCUSATE SODIUM 8.6-50 MG PO TABS: 1.0000 | ORAL_TABLET | Freq: Every evening | ORAL | Status: DC | PRN

## 2017-12-17 MED ORDER — METHYLPREDNISOLONE SODIUM SUCC 125 MG IJ SOLR
60.0000 mg | Freq: Two times a day (BID) | INTRAMUSCULAR | Status: DC
  Administered 2017-12-17: 60 mg via INTRAVENOUS
  Filled 2017-12-17: qty 2

## 2017-12-17 MED ORDER — INSULIN ASPART 100 UNIT/ML ~~LOC~~ SOLN
0.0000 [IU] | SUBCUTANEOUS | Status: DC
  Administered 2017-12-17: 3 [IU] via SUBCUTANEOUS
  Administered 2017-12-17: 5 [IU] via SUBCUTANEOUS
  Administered 2017-12-17: 3 [IU] via SUBCUTANEOUS
  Administered 2017-12-17 (×2): 5 [IU] via SUBCUTANEOUS
  Administered 2017-12-18: 3 [IU] via SUBCUTANEOUS
  Administered 2017-12-18: 5 [IU] via SUBCUTANEOUS
  Administered 2017-12-18: 2 [IU] via SUBCUTANEOUS
  Administered 2017-12-18: 3 [IU] via SUBCUTANEOUS
  Administered 2017-12-18 (×2): 6 [IU] via SUBCUTANEOUS
  Administered 2017-12-19: 2 [IU] via SUBCUTANEOUS
  Administered 2017-12-19: 1 [IU] via SUBCUTANEOUS
  Administered 2017-12-19: 3 [IU] via SUBCUTANEOUS
  Filled 2017-12-17 (×4): qty 1

## 2017-12-17 MED ORDER — SILVER SULFADIAZINE 1 % EX CREA
1.0000 "application " | TOPICAL_CREAM | Freq: Two times a day (BID) | CUTANEOUS | Status: DC
  Administered 2017-12-17 – 2017-12-20 (×5): 1 via TOPICAL
  Filled 2017-12-17: qty 85

## 2017-12-17 MED ORDER — SODIUM CHLORIDE 0.9 % IV BOLUS (SEPSIS)
1000.0000 mL | Freq: Once | INTRAVENOUS | Status: AC
  Administered 2017-12-17: 1000 mL via INTRAVENOUS

## 2017-12-17 MED ORDER — INSULIN ASPART 100 UNIT/ML ~~LOC~~ SOLN: 0.0000 [IU] | SUBCUTANEOUS | Status: DC

## 2017-12-17 MED ORDER — SODIUM CHLORIDE 0.9 % IV SOLN
INTRAVENOUS | Status: DC
  Administered 2017-12-17: 05:00:00 via INTRAVENOUS

## 2017-12-17 MED ORDER — HEPARIN SODIUM (PORCINE) 5000 UNIT/ML IJ SOLN
5000.0000 [IU] | Freq: Three times a day (TID) | INTRAMUSCULAR | Status: DC
  Administered 2017-12-17 – 2017-12-18 (×3): 5000 [IU] via SUBCUTANEOUS
  Filled 2017-12-17 (×3): qty 1

## 2017-12-17 MED ORDER — IPRATROPIUM-ALBUTEROL 0.5-2.5 (3) MG/3ML IN SOLN
3.0000 mL | Freq: Once | RESPIRATORY_TRACT | Status: AC
  Administered 2017-12-17: 3 mL via RESPIRATORY_TRACT
  Filled 2017-12-17 (×2): qty 3

## 2017-12-17 MED ORDER — FUROSEMIDE 20 MG PO TABS
20.0000 mg | ORAL_TABLET | Freq: Every day | ORAL | Status: DC
  Administered 2017-12-17: 20 mg via ORAL
  Filled 2017-12-17: qty 1

## 2017-12-17 MED ORDER — ASPIRIN 325 MG PO TABS
325.0000 mg | ORAL_TABLET | Freq: Every day | ORAL | Status: DC
  Administered 2017-12-17: 325 mg via ORAL
  Filled 2017-12-17: qty 1

## 2017-12-17 MED ORDER — BISACODYL 10 MG RE SUPP: 10.0000 mg | Freq: Every day | RECTAL | Status: DC | PRN

## 2017-12-17 MED ORDER — PANTOPRAZOLE SODIUM 40 MG IV SOLR
40.0000 mg | Freq: Once | INTRAVENOUS | Status: AC
  Administered 2017-12-17: 40 mg via INTRAVENOUS
  Filled 2017-12-17: qty 40

## 2017-12-17 MED ORDER — FINASTERIDE 5 MG PO TABS
5.0000 mg | ORAL_TABLET | Freq: Every day | ORAL | Status: DC
  Administered 2017-12-17 – 2017-12-20 (×4): 5 mg via ORAL
  Filled 2017-12-17 (×4): qty 1

## 2017-12-17 MED ORDER — PANTOPRAZOLE SODIUM 40 MG IV SOLR
40.0000 mg | INTRAVENOUS | Status: DC
  Administered 2017-12-17 – 2017-12-18 (×2): 40 mg via INTRAVENOUS
  Filled 2017-12-17 (×2): qty 40

## 2017-12-17 MED ORDER — SODIUM CHLORIDE 0.9 % IV BOLUS (SEPSIS)
500.0000 mL | Freq: Once | INTRAVENOUS | Status: AC
  Administered 2017-12-17: 500 mL via INTRAVENOUS

## 2017-12-17 MED ORDER — HYDROCHLOROTHIAZIDE 25 MG PO TABS
25.0000 mg | ORAL_TABLET | Freq: Every morning | ORAL | Status: DC
Start: 2017-12-17 — End: 2017-12-17
  Administered 2017-12-17: 25 mg via ORAL
  Filled 2017-12-17: qty 1

## 2017-12-17 MED ORDER — NICOTINE 7 MG/24HR TD PT24
7.0000 mg | MEDICATED_PATCH | Freq: Every day | TRANSDERMAL | Status: DC
  Administered 2017-12-17 – 2017-12-20 (×4): 7 mg via TRANSDERMAL
  Filled 2017-12-17 (×4): qty 1

## 2017-12-17 NOTE — ED Notes (Signed)
Phlebotomist called to obtain labs.

## 2017-12-17 NOTE — Progress Notes (Signed)
Report received from [facility]. Pt. Pick up occurred at [facility]. Pt arrived to the unit at 1810 via wheelchair.

## 2017-12-17 NOTE — Progress Notes (Addendum)
Inpatient Diabetes Program Recommendations  AACE/ADA: New Consensus Statement on Inpatient Glycemic Control (2015)  Target Ranges:  Prepandial:   less than 140 mg/dL      Peak postprandial:   less than 180 mg/dL (1-2 hours)      Critically ill patients:  140 - 180 mg/dL   Review of Glycemic Control  Diabetes history: DM 2 Outpatient Diabetes medications: Glipizide 10 mg BID, Metformin 1000 mg BID Current orders for Inpatient glycemic control: Novolog 0-6 units Q4hours  Inpatient Diabetes Program Recommendations:    Patient receiving IV solumedrol 60 mg Q12 hours. Current Glucose 257 mg/dl. Patient on Custom Novolog Correction scale. Consider increasing Correction scale to the regular Novolog Sensitive Correction 0-9 units tid while on current steroid dose.   Outpatient Recommendations:  Patient presenting with hypoglycemia the last few presentations to the ED in the 40-50 range. Due to renal function and GFR consider decreasing sulfonylurea dose at time of d/c to 5 mg BID (1/2 of home dose) also consider decreasing Metformin to 1,000 mg Daily (1/2 of home dose). If patient has poor PO intake with FTT, does not need to take DM oral medications as they need to be taken with food.  Spoke with patient and family at bedside. Patient saw his PCP at [place] [facility] in February. A1c was 6.3% at that time and he and his wife (who manages medications) were instructed to cut medication in half. Wife reports she has not done that yet. Instructed the family and patient to cut doses in half and the importance of it. Spoke to them in depth about hypoglycemia s/s and treatment. Recommendations above reflect his PCP recommendations in paperwork on what to do with the reduction of medication at patient's February visit.  Thanks,  Tama Headings RN, MSN, BC-ADM, [facility] Inpatient Diabetes Coordinator Team Pager [phone] (8a-5p)

## 2017-12-17 NOTE — ED Provider Notes (Addendum)
[facility] Provider Note   CSN: [id number] Arrival date & time: 12/17/17  0013     History   Chief Complaint Chief Complaint  Patient presents with  . Hypoglycemia    HPI [name] [name] is a 77 y.o. male.  Patient sent from home with generalized weakness and hyperglycemia.  EMS reports patient has history of diabetes and had doses of his glipizide and metformin recently changed.  He does have difficulty keeping his sugar above 80 for the past several weeks despite eating sugary food.  EMS arrived and found patient to have a blood sugar in the 40 range.  They put him on a D10 drip.  Blood sugar on arrival is 140.  He denies any complaints.  Denies feeling dizzy or lightheaded.  Denies any chest pain or shortness of breath.  Denies abdominal pain, nausea or vomiting.  He does have COPD on home oxygen.  Has developed some wheezing since arrival to the ED.  Denies chest pain, cough or fever.  No recent infectious symptoms.  Increase in his leg swelling more than baseline.   The history is provided by the patient and the EMS personnel.  Hypoglycemia  Associated symptoms: shortness of breath   Associated symptoms: no dizziness, no vomiting and no weakness     Past Medical History:  Diagnosis Date  . Cancer ([place])   . Diabetes mellitus   . Hyperlipidemia   . Hypertension   . Prostate cancer [facility])     Patient Active Problem List   Diagnosis Date Noted  . Leg weakness 11/21/2015  . Lumbar canal stenosis 11/21/2015  . Open wound of heel 02/15/2015  . History of surgical procedure 11/16/2014  . Chronic obstructive pulmonary disease ([place]) 10/31/2014  . Type 2 diabetes mellitus ([place]) 10/31/2014  . BP (high blood pressure) 10/31/2014  . Acquired cavovarus deformity of foot 04/19/2014  . Acquired equinus deformity of foot 04/19/2014  . Ankle contracture 03/28/2014  . Rhabdomyolysis 07/05/2013  . COPD exacerbation ([place]) 07/05/2013  . H/O  prostate cancer 07/04/2013  . Diabetes mellitus ([place]) 07/04/2013  . HTN (hypertension) 07/04/2013  . History of tobacco abuse 07/04/2013  . Fall 07/04/2013  . Thigh pain, musculoskeletal 07/04/2013  . Hypoxia 07/04/2013  . HLD (hyperlipidemia) 07/04/2013  . Depression 07/04/2013  . Chronic kidney disease 07/04/2013    Past Surgical History:  Procedure Laterality Date  . CATARACT EXTRACTION W/ INTRAOCULAR LENS IMPLANT  2007   bilateral  . FOOT SURGERY     left drop foot repair  . SHOULDER OPEN ROTATOR CUFF REPAIR  2010   left       Home Medications    Prior to Admission medications   Medication Sig Start Date End Date Taking? Authorizing Provider  albuterol (PROVENTIL) (2.5 MG/3ML) 0.083% nebulizer solution Take 2.5 mg by nebulization every 6 (six) hours as needed for wheezing or shortness of breath.     [provider]  aspirin 325 MG tablet Take 325 mg by mouth daily.    [provider]  atorvastatin (LIPITOR) 10 MG tablet Take 10 mg by mouth daily.    [provider]  budesonide-formoterol (SYMBICORT) 160-4.5 MCG/ACT inhaler Inhale 2 puffs into the lungs 2 (two) times daily. 07/07/13   Orson Eva, MD  cholecalciferol (VITAMIN D) 1000 UNITS tablet Take 1,000 Units by mouth daily.    [provider]  diazepam (VALIUM) 5 MG tablet Take 5 mg by mouth every 8 (eight) hours as  needed for anxiety.  03/14/14   [provider]  ferrous sulfate 325 (65 FE) MG tablet Take 325 mg by mouth daily with breakfast. Take with Calcium    [provider]  finasteride (PROSCAR) 5 MG tablet Take 5 mg by mouth daily.    [provider]  FLUoxetine (PROZAC) 40 MG capsule Take 60 mg by mouth every morning.     [provider]  gabapentin (NEURONTIN) 300 MG capsule Take 300 mg by mouth at bedtime.    [provider]  glipiZIDE (GLUCOTROL) 10 MG tablet Take 10 mg by mouth 2 (two) times daily before a meal.    [provider]  hydrochlorothiazide (HYDRODIURIL) 25 MG tablet Take 25 mg by mouth every morning.    [provider]  hydroxypropyl methylcellulose (ISOPTO TEARS) 2.5 % ophthalmic solution Place 1 drop into both eyes as needed (for dryness).     [provider]  lisinopril (PRINIVIL,ZESTRIL) 20 MG tablet Take 10 mg by mouth every morning.    [provider]  metFORMIN (GLUCOPHAGE) 1000 MG tablet Take 1,000 mg by mouth 2 (two) times daily with a meal.    [provider]  mupirocin ointment (BACTROBAN) 2 % Place 1 application into the nose 2 (two) times daily.    [provider]  oxyCODONE-acetaminophen (PERCOCET/ROXICET) 5-325 MG per tablet Take one tablet by mouth every 4 hours as needed for severe pain. Caution for altered mental status Patient not taking: Reported on 11/15/2017 09/11/14   Estill Dooms, MD  predniSONE (DELTASONE) 10 MG tablet Take 5 tablets (50 mg total) by mouth daily with breakfast. Start 07/08/13.  Decrease by one tablet daily until gone Patient not taking: Reported on 09/04/2014 07/07/13   TatShanon Brow, MD  silver sulfADIAZINE (SILVADENE) 1 % cream Apply 1 application topically 2 (two) times daily.    [provider]  tiotropium (SPIRIVA) 18 MCG inhalation capsule Place 18 mcg into inhaler and inhale daily.    [provider]    Family History History reviewed. No pertinent family history.  Social History Social History   Tobacco Use  . Smoking status: Former Smoker    Last attempt to quit: 04/11/2005    Years since quitting: 12.6  . Smokeless tobacco: Never Used  Substance Use Topics  . Alcohol use: No  . Drug use: No     Allergies   Oxycodone   Review of Systems Review of Systems  Constitutional: Positive for fatigue. Negative for activity change, appetite change and fever.  HENT: Negative for congestion and rhinorrhea.   Eyes: Negative for visual disturbance.  Respiratory: Positive for cough,  shortness of breath and wheezing. Negative for chest tightness and stridor.   Cardiovascular: Negative for chest pain.  Gastrointestinal: Negative for abdominal pain, nausea and vomiting.  Genitourinary: Negative for dysuria and hematuria.  Musculoskeletal: Negative for arthralgias and myalgias.  Skin: Negative for rash.  Neurological: Negative for dizziness, weakness and headaches.   all other systems are negative except as noted in the HPI and PMH.     Physical Exam Updated Vital Signs BP (!) 116/56   Pulse (!) 111   Resp (!) 22   SpO2 100%   Physical Exam  Constitutional: He is oriented to person, place, and time. He appears well-developed and well-nourished. No distress.  HENT:  Head: Normocephalic and atraumatic.  Mouth/Throat: Oropharynx is clear and moist. No oropharyngeal exudate.  Eyes: Conjunctivae and EOM are normal. Pupils are equal, round,  and reactive to light.  Neck: Normal range of motion. Neck supple.  No meningismus.  Cardiovascular: Normal rate, regular rhythm, normal heart sounds and intact distal pulses.  No murmur heard. Pulmonary/Chest: Effort normal. No respiratory distress. He has wheezes.  Inspiratory and expiratory wheezing throughout  Abdominal: Soft. There is no tenderness. There is no rebound and no guarding.  Musculoskeletal: Normal range of motion. He exhibits edema. He exhibits no tenderness.  +2 edema to knees bilaterally  Neurological: He is alert and oriented to person, place, and time. No cranial nerve deficit. He exhibits normal muscle tone. Coordination normal.  No ataxia on finger to nose bilaterally. No pronator drift. 5/5 strength throughout. CN 2-12 intact.Equal grip strength. Sensation intact.   Skin: Skin is warm.  Psychiatric: He has a normal mood and affect. His behavior is normal.  Nursing note and vitals reviewed.    ED Treatments / Results  Labs (all labs ordered are listed, but only abnormal results are displayed) Labs  Reviewed  COMPREHENSIVE METABOLIC PANEL - Abnormal; Notable for the following components:      Result Value   Chloride 98 (*)    Glucose, Bld 141 (*)    BUN 21 (*)    Creatinine, Ser 1.55 (*)    Calcium 8.8 (*)    Albumin 2.6 (*)    ALT 8 (*)    GFR calc non Af Amer 41 (*)    GFR calc Af Amer 48 (*)    All other components within normal limits  CBC WITH DIFFERENTIAL/PLATELET - Abnormal; Notable for the following components:   WBC 15.2 (*)    RBC 3.41 (*)    Hemoglobin 8.2 (*)    HCT 27.5 (*)    MCH 24.0 (*)    MCHC 29.8 (*)    RDW 16.2 (*)    Platelets 418 (*)    Neutro Abs 12.6 (*)    All other components within normal limits  LACTIC ACID, PLASMA - Abnormal; Notable for the following components:   Lactic Acid, Venous 2.3 (*)    All other components within normal limits  HEMOGLOBIN AND HEMATOCRIT, BLOOD - Abnormal; Notable for the following components:   Hemoglobin 7.3 (*)    HCT 24.7 (*)    All other components within normal limits  CBG MONITORING, ED - Abnormal; Notable for the following components:   Glucose-Capillary 135 (*)    All other components within normal limits  CBG MONITORING, ED - Abnormal; Notable for the following components:   Glucose-Capillary 149 (*)    All other components within normal limits  CBG MONITORING, ED - Abnormal; Notable for the following components:   Glucose-Capillary 184 (*)    All other components within normal limits  CBG MONITORING, ED - Abnormal; Notable for the following components:   Glucose-Capillary 252 (*)    All other components within normal limits  CBG MONITORING, ED - Abnormal; Notable for the following components:   Glucose-Capillary 257 (*)    All other components within normal limits  CULTURE, BLOOD (ROUTINE X 2)  CULTURE, BLOOD (ROUTINE X 2)  BRAIN NATRIURETIC PEPTIDE  TROPONIN I  URINALYSIS, ROUTINE W REFLEX MICROSCOPIC  PROTIME-INR  HEMOGLOBIN  HEMOGLOBIN  HEMOGLOBIN  TSH  ERYTHROPOIETIN  VITAMIN B12    FOLATE  IRON AND TIBC  FERRITIN  RETICULOCYTES  SAVE SMEAR  LACTIC ACID, PLASMA  LACTIC ACID, PLASMA  POC OCCULT BLOOD, ED  TYPE AND SCREEN  ABO/RH  PREPARE RBC (CROSSMATCH)  EKG  EKG Interpretation  Date/Time:  Friday December 17 2017 00:51:44 EDT Ventricular Rate:  110 PR Interval:    QRS Duration: 104 QT Interval:  348 QTC Calculation: 471 R Axis:   84 Text Interpretation:  Sinus tachycardia Borderline right axis deviation No significant change was found Confirmed by Ezequiel Essex [phone]) on 12/17/2017 1:46:04 AM       Radiology Dg Chest 2 View  Result Date: 12/17/2017 CLINICAL DATA:  Acute onset of cough. EXAM: CHEST - 2 VIEW COMPARISON:  Chest radiograph performed 11/15/2017 FINDINGS: The lungs are well-aerated. Mild bibasilar opacities may reflect atelectasis or possibly mild pneumonia. There is no evidence of pleural effusion or pneumothorax. The heart is borderline enlarged. No acute osseous abnormalities are seen. IMPRESSION: Mild bibasilar opacities may reflect atelectasis or possibly mild pneumonia. Borderline cardiomegaly. Electronically Signed   By: Garald Balding M.D.   On: 12/17/2017 01:36    Procedures Procedures (including critical care time)  Medications Ordered in ED Medications  methylPREDNISolone sodium succinate (SOLU-MEDROL) 125 mg/2 mL injection 125 mg (not administered)  ipratropium-albuterol (DUONEB) 0.5-2.5 (3) MG/3ML nebulizer solution 3 mL (3 mLs Nebulization Given 12/17/17 0041)     Initial Impression / Assessment and Plan / ED Course  I have reviewed the triage vital signs and the nursing notes.  Pertinent labs & imaging results that were available during my care of the patient were reviewed by me and considered in my medical decision making (see chart for details).    Patient from home with wheezing and hypoglycemia.  Denies pain.  Blood sugar 135 on arrival.  He is given nebulizers and steroids for wheezing and shortness of  breath.  Patient from home with persistent hypoglycemia after recent medication adjustments.  Blood sugar has stabilized in the ED.  He is given nebulizers and steroids for his wheezing.  Orthostatics are positive with acute renal failure.  FOBT positive PPI given.   Blood sugars have stabilized. D10 stopped. Labs with AKI, +orthostatics. Concern for occult GI bleeding with melena and worsening anemia with fatigue.  Admission d/w Dr. Tamala Julian.   CRITICAL CARE Performed by: Ezequiel Essex Total critical care time: 32 minutes Critical care time was exclusive of separately billable procedures and treating other patients. Critical care was necessary to treat or prevent imminent or life-threatening deterioration. Critical care was time spent personally by me on the following activities: development of treatment plan with patient and/or surrogate as well as nursing, discussions with consultants, evaluation of patient's response to treatment, examination of patient, obtaining history from patient or surrogate, ordering and performing treatments and interventions, ordering and review of laboratory studies, ordering and review of radiographic studies, pulse oximetry and re-evaluation of patient's condition.  Final Clinical Impressions(s) / ED Diagnoses   Final diagnoses:  Dehydration  Hypoglycemia  Melena    ED Discharge Orders    None       Gwin Eagon, Annie Main, MD 12/17/17 0109    Ezequiel Essex, MD 12/27/17 [phone]

## 2017-12-17 NOTE — Progress Notes (Signed)
  Echocardiogram 2D Echocardiogram has been performed.  Jannett Celestine 12/17/2017, 10:03 AM

## 2017-12-17 NOTE — ED Notes (Signed)
Pt assisted to the bathroom with with 2 assist and back to bed. Pt slightly unsteady on his feet.

## 2017-12-17 NOTE — Plan of Care (Addendum)
Discussed with Dr. Wyvonnia [name]. [name] [name] is a 77 year old malewith pmh HTN, HLD, COPD, oxygen dependent, and diabetes mellitus type 2; who presented for persistent hypoglycemia after changes made to his metformin and glyburide doses.  Reported to have been altered with  blood sugars in the 30s prior to   EMS arrival.  Patient was started on a D10 drip by EMS with blood sugars stabilized in the 140s.  D10 drip was able to be discontinued.  On physical exam patient noted to have wheezing given 125 mg of Solu-Medrol and breathing treatment.  Blood sugar stabilized but patient appeared to be dehydrated.  Dr. Wyvonnia [name] report stool guaiac to be positive, but labs reported as negative.  Patient's hemoglobin noted to be 8.2, previously 8.3 one month ago.  Accepted to a telemetry bed as observation.  Repeat H&H noted to be 7.4.  Transfuse 1 unit of packed red blood cells.  Will likely need to consult GI in a.m.

## 2017-12-17 NOTE — H&P (Addendum)
History and Physical    [name] [name] YQI:[id number] DOB: [birth year] DOA: 12/17/2017   PCP: Prince Solian, MD   Patient coming from:  Home    Chief Complaint: Generalized weakness   HPI: [name] [name] is a 77 y.o. male with medical history significant for prostate cancer, hypertension, hyperlipidemia, diabetes, peripheral neuropathy, COPD on home oxygen, obesity, CKD stage II, depression, iron deficiency anemia, recent presentation to the emergency department with generalized weakness and hypoglycemia, now presenting with similar symptoms since last evening.  Of note, the patient had recently dose changing his glipizide and Metformin.  In addition, he has been experiencing more failure to thrive over the last 4 weeks.  The wife reports that he has been difficulty keeping his sugar levels above 80 since.  Fact, his last reading was 41 at home.  He denies any nausea or vomiting.  He denies any dizziness, lightheadedness, syncope or presyncope.  He denies any chest pain or palpitations.  He denies any worsening shortness of breath or productive cough.  He denies any abdominal pain.  He denies any fever or chills.  He denies any recent infections or sick contacts.  He has reported increasing lower extremity swelling over the last month.  EMS gave him D10 drip, and his blood sugar on arrival was 140.  His blood sugar has been maintained since, of note, currently is 257, which is being better controlled with seen sliding scale insulin and discontinuation of the drip..  ED Course:  BP 119/64   Pulse (!) 106   Temp (!) 97.3 F (36.3 C) (Oral)   Resp 19   SpO2 96%   He was noted that his lactic acid was 2.3, for which he received 2 L of IV fluids as well. He was noted to have hemoglobin 7.3, with hematocrit 24.7.  Prior one was 8.2.  MCV normal at 80.6.  He denies any GI bleed.  He has not been seen by GI physicians in the past.  His Hemoccult was negative. Urinalysis negative Creatinine 1.55,  baseline 1-1.3 Chest x-ray with mild bibasilar opacities, suspicious for atelectasis.  Cannot rule out pneumonia.  He received nebulizer treatment on presentation, with improvement of his wheezing White count 15.2. Albumin 2.6 Echo today 60% -   38%, normal systolic and Gr1 DD  Review of Systems:  As per HPI otherwise all other systems reviewed and are negative  Past Medical History:  Diagnosis Date  . Cancer ([place])   . Diabetes mellitus   . Hyperlipidemia   . Hypertension   . Prostate cancer [facility])     Past Surgical History:  Procedure Laterality Date  . CATARACT EXTRACTION W/ INTRAOCULAR LENS IMPLANT  2007   bilateral  . FOOT SURGERY     left drop foot repair  . SHOULDER OPEN ROTATOR CUFF REPAIR  2010   left    Social History Social History   Socioeconomic History  . Marital status: Married    Spouse name: Not on file  . Number of children: Not on file  . Years of education: Not on file  . Highest education level: Not on file  Social Needs  . Financial resource strain: Not on file  . Food insecurity - worry: Not on file  . Food insecurity - inability: Not on file  . Transportation needs - medical: Not on file  . Transportation needs - non-medical: Not on file  Occupational History  . Not on file  Tobacco Use  . Smoking status:  Former Smoker    Last attempt to quit: 04/11/2005    Years since quitting: 12.6  . Smokeless tobacco: Never Used  Substance and Sexual Activity  . Alcohol use: No  . Drug use: No  . Sexual activity: Yes  Other Topics Concern  . Not on file  Social History Narrative  . Not on file     Allergies  Allergen Reactions  . Oxycodone Other (See Comments)    Only intolerant when takes with Diphenhydramine    History reviewed. No pertinent family history.    Prior to Admission medications   Medication Sig Start Date End Date Taking? Authorizing Provider  albuterol (PROVENTIL) (2.5 MG/3ML) 0.083% nebulizer solution Take 2.5 mg by  nebulization every 6 (six) hours as needed for wheezing or shortness of breath.    Yes [provider]  aspirin 325 MG tablet Take 325 mg by mouth daily.   Yes [provider]  atorvastatin (LIPITOR) 10 MG tablet Take 10 mg by mouth daily.   Yes [provider]  budesonide-formoterol (SYMBICORT) 160-4.5 MCG/ACT inhaler Inhale 2 puffs into the lungs 2 (two) times daily. 07/07/13  Yes Tat, Shanon Brow, MD  cholecalciferol (VITAMIN D) 1000 UNITS tablet Take 1,000 Units by mouth daily.   Yes [provider]  diazepam (VALIUM) 5 MG tablet Take 5 mg by mouth every 8 (eight) hours as needed for anxiety.  03/14/14  Yes [provider]  ferrous sulfate 325 (65 FE) MG tablet Take 325 mg by mouth daily with breakfast. Take with Calcium   Yes [provider]  finasteride (PROSCAR) 5 MG tablet Take 5 mg by mouth daily.   Yes [provider]  FLUoxetine (PROZAC) 40 MG capsule Take 60 mg by mouth every morning.    Yes [provider]  furosemide (LASIX) 20 MG tablet Take 20 mg by mouth daily.   Yes [provider]  gabapentin (NEURONTIN) 300 MG capsule Take 300 mg by mouth at bedtime.   Yes [provider]  glipiZIDE (GLUCOTROL) 10 MG tablet Take 10 mg by mouth 2 (two) times daily before a meal.   Yes [provider]  hydrochlorothiazide (HYDRODIURIL) 25 MG tablet Take 25 mg by mouth every morning.   Yes [provider]  hydroxypropyl methylcellulose (ISOPTO TEARS) 2.5 % ophthalmic solution Place 1 drop into both eyes as needed (for dryness).    Yes [provider]  lisinopril (PRINIVIL,ZESTRIL) 20 MG tablet Take 10 mg by mouth every morning.   Yes [provider]  metFORMIN (GLUCOPHAGE) 1000 MG tablet Take 1,000 mg by mouth 2 (two) times daily with a meal.   Yes [provider]  mupirocin ointment (BACTROBAN) 2 % Place 1 application into the nose 2 (two) times daily.   Yes [provider]  oxyCODONE-acetaminophen (PERCOCET/ROXICET) 5-325 MG per tablet Take one tablet by mouth every 4 hours as needed for severe pain. Caution for altered mental status 09/11/14  Yes Estill Dooms, MD  silver sulfADIAZINE (SILVADENE) 1 % cream Apply 1 application topically 2 (two) times daily.   Yes [provider]  tiotropium (SPIRIVA) 18 MCG inhalation capsule Place 18 mcg into inhaler and inhale daily.   Yes [provider]    Physical Exam:  Vitals:   12/17/17 0300 12/17/17 0315 12/17/17 0403 12/17/17 0500  BP: (!) 104/53 (!) 104/55 (!) 104/55 119/64  Pulse: (!) 107 (!) 105  (!) 106  Resp: 17 15 (!) 25 19  Temp:  TempSrc:      SpO2: 97% 98%  96%   Constitutional: NAD, calm, comfortable  Eyes: PERRL, lids and conjunctivae normal ENMT: Mucous membranes are moist, without exudate or lesions  Neck: normal, supple, no masses, no thyromegaly Respiratory: Distant sounds, mild wheezing appreciated, no apparent rhonchi.. Normal respiratory effort  Cardiovascular: Regular rate and rhythm, distant sounds, no murmur, rubs or gallops.  2+ bilateral lower extremity edema. 2+ pedal pulses. No carotid bruits.  Abdomen: Soft, obese non tender, No hepatosplenomegaly. Bowel sounds positive.  Musculoskeletal: no clubbing / cyanosis. Moves all extremities Skin: no jaundice, No lesions.  Neurologic: Sensation intact  Strength equal in all extremities.  The patient has resting tremor in both upper extremities Psychiatric:   Alert and oriented x 3. Normal mood.     Labs on Admission: I have personally reviewed following labs and imaging studies  CBC: Recent Labs  Lab 12/17/17 0043 12/17/17 0534  WBC 15.2*  --   NEUTROABS 12.6*  --   HGB 8.2* 7.3*  HCT 27.5* 24.7*  MCV 80.6  --   PLT 418*  --     Basic Metabolic Panel: Recent Labs  Lab 12/17/17 0043  NA 137  K 4.3  CL 98*  CO2 24  GLUCOSE 141*  BUN 21*  CREATININE 1.55*  CALCIUM 8.8*     GFR: CrCl cannot be calculated (Unknown ideal weight.).  Liver Function Tests: Recent Labs  Lab 12/17/17 0043  AST 19  ALT 8*  ALKPHOS 74  BILITOT 0.5  PROT 6.7  ALBUMIN 2.6*   No results for input(s): LIPASE, AMYLASE in the last 168 hours. No results for input(s): AMMONIA in the last 168 hours.  Coagulation Profile: Recent Labs  Lab 12/17/17 0325  INR 1.08    Cardiac Enzymes: Recent Labs  Lab 12/17/17 0043  TROPONINI <0.03    BNP (last 3 results) No results for input(s): PROBNP in the last 8760 hours.  HbA1C: No results for input(s): HGBA1C in the last 72 hours.  CBG: Recent Labs  Lab 12/17/17 0034 12/17/17 0102 12/17/17 0208 12/17/17 0448  GLUCAP 135* 149* 184* 252*    Lipid Profile: No results for input(s): CHOL, HDL, LDLCALC, TRIG, CHOLHDL, LDLDIRECT in the last 72 hours.  Thyroid Function Tests: No results for input(s): TSH, T4TOTAL, FREET4, T3FREE, THYROIDAB in the last 72 hours.  Anemia Panel: No results for input(s): VITAMINB12, FOLATE, FERRITIN, TIBC, IRON, RETICCTPCT in the last 72 hours.  Urine analysis:    Component Value Date/Time   COLORURINE YELLOW 12/17/2017 0535   APPEARANCEUR CLEAR 12/17/2017 0535   LABSPEC 1.011 12/17/2017 0535   PHURINE 5.0 12/17/2017 0535   GLUCOSEU NEGATIVE 12/17/2017 0535   HGBUR NEGATIVE 12/17/2017 0535   BILIRUBINUR NEGATIVE 12/17/2017 0535   KETONESUR NEGATIVE 12/17/2017 0535   PROTEINUR NEGATIVE 12/17/2017 0535   UROBILINOGEN 1.0 09/04/2014 2000   NITRITE NEGATIVE 12/17/2017 0535   LEUKOCYTESUR NEGATIVE 12/17/2017 0535    Sepsis Labs: @LABRCNTIP (procalcitonin:4,lacticidven:4) )No results found for this or any previous visit (from the past 240 hour(s)).   Radiological Exams on Admission: Dg Chest 2 View  Result Date: 12/17/2017 CLINICAL DATA:  Acute onset of cough. EXAM: CHEST - 2 VIEW COMPARISON:  Chest radiograph performed 11/15/2017 FINDINGS: The lungs are well-aerated. Mild bibasilar  opacities may reflect atelectasis or possibly mild pneumonia. There is no evidence of pleural effusion or pneumothorax. The heart is borderline enlarged. No acute osseous abnormalities are seen. IMPRESSION: Mild bibasilar opacities may reflect atelectasis or possibly mild  pneumonia. Borderline cardiomegaly. Electronically Signed   By: Garald Balding M.D.   On: 12/17/2017 01:36    EKG: Independently reviewed.  Assessment/Plan Principal Problem:   Hypoglycemia Active Problems:   H/O prostate cancer   Diabetes mellitus ([place])   HTN (hypertension)   History of tobacco abuse   HLD (hyperlipidemia)   Depression   Chronic kidney disease   Hypoglycemia, this is a recurrent presentation, last in February 2019, patient with known history of DM and Neurpopathy, Most recent A1c is 6.1 in September 2014.  Received D10 drip on transport, for a blood sugar of 41, currently in the 200s Lab Results  Component Value Date   HGBA1C 6.1 (H) 07/04/2013  Hold PO meds Unfortunately, the patient will have to be n.p.o. for now, while anemia is being evaluated, rule out GI bleed CBG monitoring Sliding scale insulin D50  if blood sugar drops Continue Neurontin Diabetes coordinator  Hypertension BP 119/64   Pulse  106   Continue home anti-hypertensive medications except holding HCTZ. Continue Lasix,    Hyperlipidemia Continue home statins   Chronic kidney disease stage 2   baseline creatinine 1-1.3 current Cr is 1.55, slightly acute. Lab Results  Component Value Date   CREATININE 1.55 (H) 12/17/2017   CREATININE 1.36 (H) 11/15/2017   CREATININE 1.29 09/04/2014  Careful IVF as the patient has received already 2 L Hold  ACEI today, may resume tomorrow Repeat BMET in am  Hold NSAIDS   Anemia of chronic disease, worsened likely to dilution, acute illness, poor nutritional status, less likely GIB, although being monitored, and iron deficiency anemia He was noted to have hemoglobin 7.3, with hematocrit  24.7.  Prior one was 8.2.  MCV normal at 80.6.  Receiving 1 unit of blood.  He denies any GI bleed.  He has not been seen by GI physicians and adamantly refused a colonoscopy in the past and continues to decline any plans for procedures His Hemoccult was negative. Albumin 2.6   Serial Hb q 6 Continue Iron supplements Anemia panel  Erythropoietin Peripheral smear    Continue to transfuse if Hb less than 7 or severely symptomatic    Elevated lactate. Etiology unclear urinalysis neg and chest x-ray without  Clear indication of infectious process. lactic acid was 2.3, for which he received 2 L of IV fluids. Afebrile. WBC 15 (receiving steroids)  Hold IVF for now due to increasing edema  Serial  lactic acid procalcitonin blood cultures CBC in am CT chest without contrast to confirm pneumonia before placing patient on antibiotics    COPD on home O2  Osats normal . Received Solumedrol 125 mg IV on presentation with albuterol neb for wheezing. WBC 15  Continue Duonebs and O2,   Solumedrol 60 bid IV  Continue Protonix coverage  Monitor CBC      Increasing leg edema, in the setting of mild CHF versus low albumin. CXR with borderline cardiomegaly. EKG without acute changes  EF 60% -   29% normal systolic and Gr 1 DD Check BNP  increase Lasix to 40 mg daily while HCTZ on hold    Tobacco abuse with nicotine withdrawal -  Nicotine patch -  Counseled cessation   Depression and anxiety Continue home Prozac and Valium  Prostate cancer   follow as OP  Continue Proscar   Generalized weakness, deconditioning. Alb 2.6, decreased oral intake, decreased mobility PT/OT Nutrition consult   DVT prophylaxis:  SCD due to worsening Hb  Code Status:  Full  Family Communication:  Discussed with patient and family Disposition Plan: Expect patient to be discharged to home after condition improves Consults called:    None  Admission status:  Tele Obs    Sharene Butters, PA-C [facility]    Amion text  [phone]   12/17/2017, 7:03 AM

## 2017-12-17 NOTE — ED Triage Notes (Signed)
Pt coming from home by [facility]. Pt has hx of diabetes in the last couple of weeks pt glpizide and metaphormin were change unsure how. Pt has been having trouble the last couple of weeks to keep sugar up above 80 today pt became altered family checked cbg and was 30 so the gave him a bunch of food to eat and his sugar would not go above 40. Ems arrived and sugar was still 40. Pt was started on a d10 drip and sugar for ems is 140. Pt was also recently put on lasix for swelling in the legs.

## 2017-12-17 NOTE — Evaluation (Signed)
Physical Therapy Evaluation Patient Details Name: [name] [name] MRN: [id number] DOB: 21-Mar-1941 Today's Date: 12/17/2017   History of Present Illness  77 yo male was admitted with hypoglycemia and PNA, FTT and EF 60-65%with anemia now referred to PT.  Elevated lactic acid, elevated pulses with gait and ex.  PMHX:  prostate CA, DM, HTN, PN, COPD, CKD 2,   Clinical Impression  Pt is able to stand and sidestep with PT but note his pulses are unpredictable and up as high as 183.  O2 sats are stable however, no lower than 90%.    Follow Up Recommendations SNF    Equipment Recommendations  None recommended by PT(will have SNF assess)    Recommendations for Other Services       Precautions / Restrictions Precautions Precautions: Fall Restrictions Weight Bearing Restrictions: No      Mobility  Bed Mobility Overal bed mobility: Needs Assistance Bed Mobility: Supine to Sit;Sit to Supine     Supine to sit: Mod assist Sit to supine: Mod assist      Transfers Overall transfer level: Modified independent Equipment used: Rolling walker (2 wheeled);None             General transfer comment: 100% cues for hand plac  Ambulation/Gait                Stairs            Wheelchair Mobility    Modified Rankin (Stroke Patients Only)       Balance                                             Pertinent Vitals/Pain Pain Assessment: No/denies pain    Home Living Family/patient expects to be discharged to:: Private residence Living Arrangements: Spouse/significant other Available Help at Discharge: Friend(s);Family;Personal care attendant Type of Home: House Home Access: Level entry     Home Layout: One level Home Equipment: Walker - 4 wheels      Prior Function Level of Independence: Independent with assistive device(s)               Hand Dominance   Dominant Hand: Right    Extremity/Trunk Assessment   Upper Extremity  Assessment Upper Extremity Assessment: Generalized weakness    Lower Extremity Assessment Lower Extremity Assessment: Generalized weakness    Cervical / Trunk Assessment Cervical / Trunk Assessment: Normal  Communication   Communication: Prefers language other than English  Cognition Arousal/Alertness: Awake/alert Behavior During Therapy: WFL for tasks assessed/performed Overall Cognitive Status: Within Functional Limits for tasks assessed                                        General Comments General comments (skin integrity, edema, etc.): Pt has increased control of standing with practice but SOB with effort     Exercises     Assessment/Plan    PT Assessment Patient needs continued PT services  PT Problem List Decreased strength;Decreased range of motion;Decreased activity tolerance;Decreased balance;Decreased mobility;Decreased coordination;Decreased knowledge of use of DME;Decreased safety awareness;Decreased knowledge of precautions;Impaired sensation;Cardiopulmonary status limiting activity       PT Treatment Interventions DME instruction;Gait training;Stair training;Functional mobility training;Therapeutic activities;Therapeutic exercise;Balance training;Neuromuscular re-education;Patient/family education    PT Goals (Current goals can be found in the Care  Plan section)  Acute Rehab PT Goals Patient Stated Goal: none stated PT Goal Formulation: With patient/family Time For Goal Achievement: 12/31/17 Potential to Achieve Goals: Good    Frequency Min 3X/week   Barriers to discharge Decreased caregiver support home alone with wife    Co-evaluation               AM-PAC PT "6 Clicks" Daily Activity  Outcome Measure Difficulty turning over in bed (including adjusting bedclothes, sheets and blankets)?: Unable Difficulty moving from lying on back to sitting on the side of the bed? : Unable Difficulty sitting down on and standing up from a  chair with arms (e.g., wheelchair, bedside commode, etc,.)?: Unable Help needed moving to and from a bed to chair (including a wheelchair)?: A Little Help needed walking in hospital room?: A Little Help needed climbing 3-5 steps with a railing? : A Little 6 Click Score: 12    End of Session Equipment Utilized During Treatment: Gait belt Activity Tolerance: Patient tolerated treatment well Patient left: in bed;with call bell/phone within reach;with nursing/sitter in room;with family/visitor present Nurse Communication: Mobility status PT Visit Diagnosis: Unsteadiness on feet (R26.81);Other abnormalities of gait and mobility (R26.89);Repeated falls (R29.6);Muscle weakness (generalized) (M62.81);History of falling (Z91.81);Ataxic gait (R26.0)    Time: 1331-1400 PT Time Calculation (min) (ACUTE ONLY): 29 min   Charges:   PT Evaluation $PT Eval Moderate Complexity: 1 Mod PT Treatments $Gait Training: 8-22 mins   PT G Codes:   PT G-Codes **NOT FOR INPATIENT CLASS** Functional Assessment Tool Used: AM-PAC 6 Clicks Basic Mobility    Ramond Dial 12/17/2017, 11:28 PM   Mee Hives, PT MS Acute Rehab Dept. Number: [place] [facility] [place]

## 2017-12-18 ENCOUNTER — Observation Stay (HOSPITAL_BASED_OUTPATIENT_CLINIC_OR_DEPARTMENT_OTHER): Payer: Medicare HMO

## 2017-12-18 ENCOUNTER — Other Ambulatory Visit: Payer: Self-pay

## 2017-12-18 DIAGNOSIS — R609 Edema, unspecified: Secondary | ICD-10-CM

## 2017-12-18 DIAGNOSIS — E162 Hypoglycemia, unspecified: Secondary | ICD-10-CM | POA: Diagnosis not present

## 2017-12-18 LAB — ERYTHROPOIETIN: Erythropoietin: 3.4 m[IU]/mL (ref 2.6–18.5)

## 2017-12-18 LAB — BASIC METABOLIC PANEL
Anion gap: 11 (ref 5–15)
BUN: 25 mg/dL — ABNORMAL HIGH (ref 6–20)
CALCIUM: 8.5 mg/dL — AB (ref 8.9–10.3)
CO2: 25 mmol/L (ref 22–32)
CREATININE: 1.46 mg/dL — AB (ref 0.61–1.24)
Chloride: 100 mmol/L — ABNORMAL LOW (ref 101–111)
GFR calc non Af Amer: 44 mL/min — ABNORMAL LOW (ref >60)
GFR, EST AFRICAN AMERICAN: 51 mL/min — AB (ref >60)
Glucose, Bld: 336 mg/dL — ABNORMAL HIGH (ref 65–99)
Potassium: 4.4 mmol/L (ref 3.5–5.1)
Sodium: 136 mmol/L (ref 135–145)

## 2017-12-18 LAB — CBC
HCT: 26.4 % — ABNORMAL LOW (ref 39.0–52.0)
Hemoglobin: 7.9 g/dL — ABNORMAL LOW (ref 13.0–17.0)
MCH: 23.8 pg — AB (ref 26.0–34.0)
MCHC: 29.9 g/dL — AB (ref 30.0–36.0)
MCV: 79.5 fL (ref 78.0–100.0)
PLATELETS: 329 10*3/uL (ref 150–400)
RBC: 3.32 MIL/uL — AB (ref 4.22–5.81)
RDW: 15.6 % — AB (ref 11.5–15.5)
WBC: 9.1 10*3/uL (ref 4.0–10.5)

## 2017-12-18 LAB — GLUCOSE, CAPILLARY
GLUCOSE-CAPILLARY: 221 mg/dL — AB (ref 65–99)
GLUCOSE-CAPILLARY: 283 mg/dL — AB (ref 65–99)
GLUCOSE-CAPILLARY: 305 mg/dL — AB (ref 65–99)
GLUCOSE-CAPILLARY: 355 mg/dL — AB (ref 65–99)
Glucose-Capillary: 298 mg/dL — ABNORMAL HIGH (ref 65–99)
Glucose-Capillary: 392 mg/dL — ABNORMAL HIGH (ref 65–99)

## 2017-12-18 MED ORDER — ADULT MULTIVITAMIN W/MINERALS CH
1.0000 | ORAL_TABLET | Freq: Every day | ORAL | Status: DC
  Administered 2017-12-19 – 2017-12-20 (×2): 1 via ORAL
  Filled 2017-12-18 (×2): qty 1

## 2017-12-18 MED ORDER — SODIUM CHLORIDE 0.9 % IV SOLN
1.0000 g | INTRAVENOUS | Status: DC
  Administered 2017-12-18 – 2017-12-20 (×3): 1 g via INTRAVENOUS
  Filled 2017-12-18 (×3): qty 10

## 2017-12-18 MED ORDER — FERROUS SULFATE 325 (65 FE) MG PO TABS
325.0000 mg | ORAL_TABLET | Freq: Two times a day (BID) | ORAL | Status: DC
  Administered 2017-12-18 – 2017-12-20 (×4): 325 mg via ORAL
  Filled 2017-12-18 (×4): qty 1

## 2017-12-18 MED ORDER — CYANOCOBALAMIN 1000 MCG/ML IJ SOLN
1000.0000 ug | Freq: Once | INTRAMUSCULAR | Status: AC
  Administered 2017-12-18: 1000 ug via INTRAMUSCULAR
  Filled 2017-12-18 (×2): qty 1

## 2017-12-18 MED ORDER — IPRATROPIUM-ALBUTEROL 0.5-2.5 (3) MG/3ML IN SOLN
3.0000 mL | Freq: Four times a day (QID) | RESPIRATORY_TRACT | Status: DC
  Administered 2017-12-18 – 2017-12-20 (×8): 3 mL via RESPIRATORY_TRACT
  Filled 2017-12-18 (×8): qty 3

## 2017-12-18 MED ORDER — PANTOPRAZOLE SODIUM 40 MG PO TBEC
40.0000 mg | DELAYED_RELEASE_TABLET | Freq: Every day | ORAL | Status: DC
  Administered 2017-12-19 – 2017-12-20 (×2): 40 mg via ORAL
  Filled 2017-12-18 (×2): qty 1

## 2017-12-18 MED ORDER — FUROSEMIDE 20 MG PO TABS
20.0000 mg | ORAL_TABLET | Freq: Every day | ORAL | Status: DC
  Administered 2017-12-18 – 2017-12-20 (×3): 20 mg via ORAL
  Filled 2017-12-18 (×3): qty 1

## 2017-12-18 MED ORDER — SODIUM CHLORIDE 0.9 % IV SOLN
500.0000 mg | INTRAVENOUS | Status: DC
  Administered 2017-12-18 – 2017-12-20 (×3): 500 mg via INTRAVENOUS
  Filled 2017-12-18 (×3): qty 500

## 2017-12-18 MED ORDER — PREMIER PROTEIN SHAKE
11.0000 [oz_av] | Freq: Two times a day (BID) | ORAL | Status: DC
  Administered 2017-12-18 – 2017-12-19 (×2): 11 [oz_av] via ORAL
  Filled 2017-12-18 (×7): qty 325.31

## 2017-12-18 NOTE — Progress Notes (Deleted)
Patient ordered CPAP and didn't want to have one set up tonight. Stated if he would change his mind he would call. RN aware

## 2017-12-18 NOTE — Progress Notes (Signed)
Initial Nutrition Assessment  DOCUMENTATION CODES:   Not applicable  INTERVENTION:   Premier Protein BID, each supplement provides 160 kcal and 30 grams of protein.   MVI daily   B12 supplementation   NUTRITION DIAGNOSIS:   Increased nutrient needs related to cancer and cancer related treatments, other (see comment)(COPD, wounds) as evidenced by increased estimated needs from protein.  GOAL:   Patient will meet greater than or equal to 90% of their needs  MONITOR:   PO intake, Supplement acceptance, Weight trends, Labs, I & O's, Skin  REASON FOR ASSESSMENT:   Consult Assessment of nutrition requirement/status  ASSESSMENT:   77 year old male with history of prostrate cancer, dyslipidemia, diabetes, peripheral neuropathy, COPD on home oxygen 2 L, iron deficiency anemia, who presents today with generalized weakness and recurrent hypoglycemia. Patient brought in by family and states that he has had 3-4 episodes of hypoglycemia over the last 1 week, during which  he gets very symptomatic with lightheadedness, diarrhea, nausea, clammy, diaphoretic.    Met with pt in room today. Pt reports decreased appetite and oral intake for the past 5-6 weeks. Pt reports his UBW is around 240lbs but per chart, pt has been around 210-214lbs for the past year. Pt reports that his appetite is slowly improving. Pt did not eat his breakfast this morning because he spilled his coffee in it and refused to order a fresh tray. Pt reports that he is planning to eat lunch today. Pt does not like Ensure/Boost as he reports they are too sweet. RD will order Premier Protein and add MVI. Pt noted to have low B12; pt was given 1000IU IM.    Medications reviewed and include: vitamin D, B12, ferrous sulfate, lasix, heparin, insulin, MVI, nicotine, protonix, azithromycin, ceftriaxone  Labs reviewed: Cl 100(L), BUN 25(H), creat 1.46(H), Ca 8.5(L) B12- 143(L)- 3/15 Hgb 7.9(L), Hct 26.4(L), MCH 23.8(L), MCHC  29.9(L) cbgs- 141, 336 x 24 hrs  Nutrition-Focused physical exam completed. Findings are no fat depletion, no muscle depletion, and no edema.   Diet Order:  Diet Carb Modified Fluid consistency: Thin; Room service appropriate? Yes  EDUCATION NEEDS:   Education needs have been addressed  Skin:  Skin Assessment: (wound buttocks )  Last BM:  3/14  Height:   Ht Readings from Last 1 Encounters:  12/18/17 5' 8" (1.727 m)    Weight:   Wt Readings from Last 1 Encounters:  12/18/17 205 lb 11 oz (93.3 kg)    Ideal Body Weight:  70 kg  BMI:  Body mass index is 31.27 kg/m.  Estimated Nutritional Needs:   Kcal:  2000-2300kcal/day   Protein:  93-112g/day   Fluid:  >1.8L/day   Koleen Distance MS, RD, LDN Pager #[phone] After Hours Pager: [phone]

## 2017-12-18 NOTE — Evaluation (Signed)
Occupational Therapy Evaluation Patient Details Name: [name] [name] MRN: [id number] DOB: 1941-07-05 Today's Date: 12/18/2017    History of Present Illness Pt is a 77 y.o. male with PMH signficant for prostate cancer, hypertension, hyperlipidemia, diabetes, peripheral neuropathy,COPD on home oxygen, obesity, CKD stage II, depression, iron deficiency anemia, recent presentation to the emergency department with generalized weakness and hypoglycemia, nowpresenting with similar symptoms since the evening of 12/16/17. Found to have community acquired pneumonia and hypoglycemia and thus admitted for observation.   Clinical Impression   PTA, pt had occasional assistance for LB dressing from his wife and ambulated with rollator. His wife is able to provide 24 hour assistance and is responsible for all IADL/home management tasks. Pt currently presents with shortness of breath on 4 L O2 with dyspnea 2/4 during standing and ambulating ADL in room. He requires min assist for toilet transfers, min guard assist for standing grooming tasks and min guard assist for LB ADL. Pt additionally presenting with decreased activity tolerance for ADL and generalized weakness impacting his ability to participate in daily self-care tasks. Feel pt would benefit from short-term SNF placement but he is adamant that he prefers returning home. As such, will need to maximize home health services including OT and aide. OT will continue to follow while admitted with focus on ADL independence and energy conservation education.    Follow Up Recommendations  Home health OT;Supervision/Assistance - 24 hour(Pt declines SNF and will need 24 hour assist)    Equipment Recommendations  None recommended by OT(has needs met)    Recommendations for Other Services       Precautions / Restrictions Precautions Precautions: Fall Restrictions Weight Bearing Restrictions: No      Mobility Bed Mobility               General bed  mobility comments: Seated at EOB on my arrival.   Transfers Overall transfer level: Needs assistance Equipment used: Rolling walker (2 wheeled) Transfers: Sit to/from Stand Sit to Stand: Min guard         General transfer comment: Min guard for safety to power up to standing. Maximum cues for hand placement.     Balance Overall balance assessment: Needs assistance Sitting-balance support: No upper extremity supported;Feet supported Sitting balance-Leahy Scale: Fair     Standing balance support: Bilateral upper extremity supported;During functional activity Standing balance-Leahy Scale: Poor Standing balance comment: Relies on B UE support during all standing tasks.                            ADL either performed or assessed with clinical judgement   ADL Overall ADL's : Needs assistance/impaired Eating/Feeding: Set up;Sitting   Grooming: Min guard;Standing;Wash/dry hands Grooming Details (indicate cue type and reason): Unable to stand at sink without leaning onto forearms for support.  Upper Body Bathing: Set up;Sitting   Lower Body Bathing: Min guard;Sit to/from stand   Upper Body Dressing : Set up;Sitting   Lower Body Dressing: Min guard;Sit to/from stand Lower Body Dressing Details (indicate cue type and reason): Required rest break between tasks Toilet Transfer: Minimal assistance;Ambulation;RW Toilet Transfer Details (indicate cue type and reason): Simulated in room Toileting- Clothing Manipulation and Hygiene: Min guard;Sit to/from stand Toileting - Clothing Manipulation Details (indicate cue type and reason): from elevated surface     Functional mobility during ADLs: Minimal assistance;Rolling walker General ADL Comments: Initiated education concerning energy conservation and importance of participating in ADL as independently as  possible.      Vision Patient Visual Report: No change from baseline Vision Assessment?: No apparent visual deficits      Perception     Praxis      Pertinent Vitals/Pain Pain Assessment: No/denies pain     Hand Dominance Right   Extremity/Trunk Assessment Upper Extremity Assessment Upper Extremity Assessment: Generalized weakness   Lower Extremity Assessment Lower Extremity Assessment: Generalized weakness   Cervical / Trunk Assessment Cervical / Trunk Assessment: Normal   Communication Communication Communication: No difficulties   Cognition Arousal/Alertness: Awake/alert Behavior During Therapy: WFL for tasks assessed/performed Overall Cognitive Status: Within Functional Limits for tasks assessed                                     General Comments  Dyspnea 2/4 with standing and ambulating ADL    Exercises     Shoulder Instructions      Home Living Family/patient expects to be discharged to:: Private residence Living Arrangements: Spouse/significant other Available Help at Discharge: Friend(s);Family;Personal care attendant Type of Home: House Home Access: Level entry     Home Layout: One level     Bathroom Shower/Tub: Walk-in shower;Tub/shower unit(uses walk in)   [employer]: Standard     Home Equipment: Environmental consultant - 4 wheels;Shower seat - built in;Grab bars - tub/shower;Bedside commode   Additional Comments: uses BSC next to bed at night; has lift chair      Prior Functioning/Environment Level of Independence: Needs assistance  Gait / Transfers Assistance Needed: Uses rollator for ambulation with multiple breaks due to fatigue.  ADL's / Homemaking Assistance Needed: Wife assists with LB dressing at times. Able to complete showers without assistance. Wife does most of housework. Will go to store but only with wife present.     Comments: Not very active at home per pt and family        OT Problem List: Decreased strength;Decreased activity tolerance;Impaired balance (sitting and/or standing);Decreased safety awareness;Decreased knowledge of use of  DME or AE;Decreased knowledge of precautions;Cardiopulmonary status limiting activity;Increased edema      OT Treatment/Interventions: Self-care/ADL training;Therapeutic exercise;Energy conservation;DME and/or AE instruction;Therapeutic activities;Patient/family education;Balance training    OT Goals(Current goals can be found in the care plan section) Acute Rehab OT Goals Patient Stated Goal: none stated OT Goal Formulation: With patient/family Time For Goal Achievement: 01/01/18 Potential to Achieve Goals: Good  OT Frequency: Min 2X/week   Barriers to D/C:            Co-evaluation              AM-PAC PT "6 Clicks" Daily Activity     Outcome Measure Help from another person eating meals?: A Little Help from another person taking care of personal grooming?: A Little Help from another person toileting, which includes using toliet, bedpan, or urinal?: A Little Help from another person bathing (including washing, rinsing, drying)?: A Little Help from another person to put on and taking off regular upper body clothing?: A Little Help from another person to put on and taking off regular lower body clothing?: A Little 6 Click Score: 18   End of Session Equipment Utilized During Treatment: Gait belt;Rolling walker;Oxygen(4L O2)  Activity Tolerance: Patient tolerated treatment well Patient left: in bed;with call bell/phone within reach;with family/visitor present(seated at EOB)  OT Visit Diagnosis: Unsteadiness on feet (R26.81);Other abnormalities of gait and mobility (R26.89);Muscle weakness (generalized) (M62.81)  Time: 8022-1798 OT Time Calculation (min): 18 min Charges:  OT General Charges $OT Visit: 1 Visit OT Evaluation $OT Eval Moderate Complexity: 1 Mod G-Codes:     Norman Herrlich, MS OTR/L  Pager: [place] 12/18/2017, 11:58 AM

## 2017-12-18 NOTE — Progress Notes (Signed)
LE venous duplex prelim: negative for DVT. Large ruptured baker's cyst noted on the right extending from the popliteal fossa to the mid calf. Landry Mellow, RDMS, RVT

## 2017-12-18 NOTE — Progress Notes (Signed)
PROGRESS NOTE    [name] [name]  GDJ:[id number] DOB: 04-24-1941 DOA: 12/17/2017 PCP: Prince Solian, MD    Brief Narrative: 77 year old male with history of prostrate cancer, dyslipidemia, diabetes, peripheral neuropathy, COPD on home oxygen 2 L, iron deficiency anemia, who presents today with generalized weakness and recurrent hypoglycemia. Patient brought in by family and states that he has had 3-4 episodes of hypoglycemia over the last 1 week, during which  he gets very symptomatic with lightheadedness, diarrhea, nausea, clammy, diaphoretic. Today his CBG was in the 30s and he could not increase it with several dietary interventions, called EMS and brought him to the ED. It is unclear as to what patient's antidiabetic regimen at home is. Talked with his wife and he has been taking metformin XL 1000 mg twice a day and glipizide 10 mg twice a day. Dose recently it adjusted by PCP. Family states that his oral intake has been extremely poor secondary to not having any appetite. Patient is also found to be quite anemic and has never had a colonoscopy by choice. Is status post transfusion with 1 unit of packed red blood cells Chest x-ray shows bibasilar opacities.   Assessment & Plan:   Principal Problem:   Hypoglycemia Active Problems:   H/O prostate cancer   Diabetes mellitus (HCC)   HTN (hypertension)   History of tobacco abuse   HLD (hyperlipidemia)   Depression   Chronic kidney disease  1-Hypoglycemia; DM; now with hyperglycemia.  Hold oral hypoglycemic agents.  Will continue with SSI.   Anemia; B 12 deficiency;  Start B 12 supplement. Iron supplement. Repeat hb in am.  No evidence of bleeding.   PNA;  Start ceftriaxone and azithromycin.   HTN;  HLD; on statins.   Chronic kidney disease stage 2   baseline creatinine 1-1.3 current Cr is 1.55, slightly acute. Monitor on lasix.   COPD on home O2   Start schedule nebulizer.   Depression and anxiety Continue home Prozac  and Valium  Prostate cancer   follow as OP  Continue Proscar       DVT prophylaxis: SCD Code Status: Full code.  Family Communication: care discussed with patient.  Disposition Plan: to be determine.   Consultants:   none   Procedures:   none   Antimicrobials:   Ceftriaxone  Azithromycin.    Subjective: He is breathing better. Cough improved. He denies melena, or hematochezia.    Objective: Vitals:   12/17/17 2159 12/18/17 0429 12/18/17 0430 12/18/17 0733  BP: (!) 133/54  127/60   Pulse: 73  72   Resp: 20  20   Temp: (!) 97.4 F (36.3 C)  (!) 97.4 F (36.3 C)   TempSrc: Oral  Oral   SpO2: 98%  100%   Weight:  94 kg (207 lb 3.7 oz)  93.3 kg (205 lb 11 oz)  Height:    5\' 8"  (1.727 m)    Intake/Output Summary (Last 24 hours) at 12/18/2017 0740 Last data filed at 12/17/2017 0928 Gross per 24 hour  Intake 236 ml  Output -  Net 236 ml   Filed Weights   12/18/17 0429 12/18/17 0733  Weight: 94 kg (207 lb 3.7 oz) 93.3 kg (205 lb 11 oz)    Examination:  General exam: Appears calm and comfortable  Respiratory system: Bilateral wheezing.  Respiratory effort normal. Cardiovascular system: S1 & S2 heard, RRR. No JVD, murmurs, rubs, gallops or clicks. No pedal edema. Gastrointestinal system: Abdomen is nondistended, soft and nontender.  No organomegaly or masses felt. Normal bowel sounds heard. Central nervous system: Alert and oriented. No focal neurological deficits. Extremities: Symmetric 5 x 5 power. Skin: No rashes, lesions or ulcers   Data Reviewed: I have personally reviewed following labs and imaging studies  CBC: Recent Labs  Lab 12/17/17 0043 12/17/17 0534 12/17/17 1058 12/17/17 1545 12/17/17 2036 12/18/17 0227  WBC 15.2*  --   --   --   --  9.1  NEUTROABS 12.6*  --   --   --   --   --   HGB 8.2* 7.3* 8.1* 8.1* 8.0* 7.9*  HCT 27.5* 24.7*  --   --   --  26.4*  MCV 80.6  --   --   --   --  79.5  PLT 418*  --   --   --   --  366    Basic Metabolic Panel: Recent Labs  Lab 12/17/17 0043 12/18/17 0227  NA 137 136  K 4.3 4.4  CL 98* 100*  CO2 24 25  GLUCOSE 141* 336*  BUN 21* 25*  CREATININE 1.55* 1.46*  CALCIUM 8.8* 8.5*   GFR: Estimated Creatinine Clearance: 46.2 mL/min (A) (by C-G formula based on SCr of 1.46 mg/dL (H)). Liver Function Tests: Recent Labs  Lab 12/17/17 0043  AST 19  ALT 8*  ALKPHOS 74  BILITOT 0.5  PROT 6.7  ALBUMIN 2.6*   No results for input(s): LIPASE, AMYLASE in the last 168 hours. No results for input(s): AMMONIA in the last 168 hours. Coagulation Profile: Recent Labs  Lab 12/17/17 0325  INR 1.08   Cardiac Enzymes: Recent Labs  Lab 12/17/17 0043  TROPONINI <0.03   BNP (last 3 results) No results for input(s): PROBNP in the last 8760 hours. HbA1C: No results for input(s): HGBA1C in the last 72 hours. CBG: Recent Labs  Lab 12/17/17 1540 12/17/17 1730 12/17/17 2016 12/18/17 0004 12/18/17 0432  GLUCAP 305* 244* 325* 355* 305*   Lipid Profile: No results for input(s): CHOL, HDL, LDLCALC, TRIG, CHOLHDL, LDLDIRECT in the last 72 hours. Thyroid Function Tests: Recent Labs    12/17/17 1058  TSH 0.195*   Anemia Panel: Recent Labs    12/17/17 1058  VITAMINB12 143*  FOLATE 7.1  FERRITIN 86  TIBC 260  IRON 108  RETICCTPCT 1.6   Sepsis Labs: Recent Labs  Lab 12/17/17 0534 12/17/17 1058 12/17/17 1545  PROCALCITON  --  0.22  --   LATICACIDVEN 2.3* 2.9* 1.0    No results found for this or any previous visit (from the past 240 hour(s)).       Radiology Studies: Dg Chest 2 View  Result Date: 12/17/2017 CLINICAL DATA:  Acute onset of cough. EXAM: CHEST - 2 VIEW COMPARISON:  Chest radiograph performed 11/15/2017 FINDINGS: The lungs are well-aerated. Mild bibasilar opacities may reflect atelectasis or possibly mild pneumonia. There is no evidence of pleural effusion or pneumothorax. The heart is borderline enlarged. No acute osseous abnormalities are  seen. IMPRESSION: Mild bibasilar opacities may reflect atelectasis or possibly mild pneumonia. Borderline cardiomegaly. Electronically Signed   By: Garald Balding M.D.   On: 12/17/2017 01:36   Ct Chest Wo Contrast  Result Date: 12/17/2017 CLINICAL DATA:  Chest pain and shortness of Breath EXAM: CT CHEST WITHOUT CONTRAST TECHNIQUE: Multidetector CT imaging of the chest was performed following the standard protocol without IV contrast. COMPARISON:  Chest x-ray from earlier in the same day FINDINGS: Cardiovascular: Thoracic aorta demonstrates atherosclerotic calcifications without significant  aneurysmal dilatation. Coronary calcifications are seen. No cardiac enlargement is noted. Mediastinum/Nodes: Thoracic inlet is within normal limits. No hilar or mediastinal adenopathy is seen. The esophagus is within normal limits. Lungs/Pleura: Left lower lobe infiltrate is noted. No associated effusion is seen. No significant parenchymal nodules are noted. Upper Abdomen: Visualized upper abdomen is within normal limits. Musculoskeletal: Biapical pleural and parenchymal scarring is seen. Emphysematous changes are noted. IMPRESSION: Left lower lobe infiltrate. Aortic Atherosclerosis (ICD10-I70.0) and Emphysema (ICD10-J43.9). Electronically Signed   By: Inez Catalina M.D.   On: 12/17/2017 14:21        Scheduled Meds: . aspirin  325 mg Oral Daily  . atorvastatin  10 mg Oral Daily  . cholecalciferol  1,000 Units Oral Daily  . ferrous sulfate  325 mg Oral Q breakfast  . finasteride  5 mg Oral Daily  . FLUoxetine  60 mg Oral q morning - 10a  . gabapentin  300 mg Oral QHS  . heparin injection (subcutaneous)  5,000 Units Subcutaneous Q8H  . insulin aspart  0-6 Units Subcutaneous Q4H  . mupirocin ointment  1 application Nasal BID  . nicotine  7 mg Transdermal Daily  . pantoprazole (PROTONIX) IV  40 mg Intravenous Q24H  . silver sulfADIAZINE  1 application Topical BID   Continuous Infusions:   LOS: 0 days     Time spent: 35 minutes.     Elmarie Shiley, MD [facility] Pager [phone]  If 7PM-7AM, please contact night-coverage [email or web address] Password [facility] 12/18/2017, 7:40 AM

## 2017-12-19 DIAGNOSIS — E162 Hypoglycemia, unspecified: Secondary | ICD-10-CM | POA: Diagnosis not present

## 2017-12-19 LAB — CBC
HEMATOCRIT: 25.9 % — AB (ref 39.0–52.0)
HEMOGLOBIN: 7.6 g/dL — AB (ref 13.0–17.0)
MCH: 23.4 pg — AB (ref 26.0–34.0)
MCHC: 29.3 g/dL — AB (ref 30.0–36.0)
MCV: 79.7 fL (ref 78.0–100.0)
Platelets: 287 10*3/uL (ref 150–400)
RBC: 3.25 MIL/uL — AB (ref 4.22–5.81)
RDW: 15.8 % — ABNORMAL HIGH (ref 11.5–15.5)
WBC: 8.7 10*3/uL (ref 4.0–10.5)

## 2017-12-19 LAB — BASIC METABOLIC PANEL
Anion gap: 11 (ref 5–15)
Anion gap: 11 (ref 5–15)
BUN: 25 mg/dL — ABNORMAL HIGH (ref 6–20)
BUN: 24 mg/dL — AB (ref 6–20)
CALCIUM: 8.9 mg/dL (ref 8.9–10.3)
CHLORIDE: 101 mmol/L (ref 101–111)
CO2: 26 mmol/L (ref 22–32)
CO2: 28 mmol/L (ref 22–32)
CREATININE: 1.38 mg/dL — AB (ref 0.61–1.24)
Calcium: 8.7 mg/dL — ABNORMAL LOW (ref 8.9–10.3)
Chloride: 100 mmol/L — ABNORMAL LOW (ref 101–111)
Creatinine, Ser: 1.3 mg/dL — ABNORMAL HIGH (ref 0.61–1.24)
GFR calc non Af Amer: 51 mL/min — ABNORMAL LOW (ref >60)
GFR, EST AFRICAN AMERICAN: 59 mL/min — AB (ref >60)
GFR, EST AFRICAN AMERICAN: 55 mL/min — AB (ref >60)
GFR, EST NON AFRICAN AMERICAN: 47 mL/min — AB (ref >60)
Glucose, Bld: 227 mg/dL — ABNORMAL HIGH (ref 65–99)
Glucose, Bld: 224 mg/dL — ABNORMAL HIGH (ref 65–99)
POTASSIUM: 3.7 mmol/L (ref 3.5–5.1)
Potassium: 3.9 mmol/L (ref 3.5–5.1)
SODIUM: 138 mmol/L (ref 135–145)
SODIUM: 139 mmol/L (ref 135–145)

## 2017-12-19 LAB — GLUCOSE, CAPILLARY
GLUCOSE-CAPILLARY: 171 mg/dL — AB (ref 65–99)
GLUCOSE-CAPILLARY: 204 mg/dL — AB (ref 65–99)
GLUCOSE-CAPILLARY: 216 mg/dL — AB (ref 65–99)
GLUCOSE-CAPILLARY: 217 mg/dL — AB (ref 65–99)
GLUCOSE-CAPILLARY: 285 mg/dL — AB (ref 65–99)
Glucose-Capillary: 155 mg/dL — ABNORMAL HIGH (ref 65–99)
Glucose-Capillary: 147 mg/dL — ABNORMAL HIGH (ref 65–99)

## 2017-12-19 MED ORDER — GLIPIZIDE 5 MG PO TABS
2.5000 mg | ORAL_TABLET | Freq: Two times a day (BID) | ORAL | Status: DC
  Administered 2017-12-19 – 2017-12-20 (×2): 2.5 mg via ORAL
  Filled 2017-12-19 (×2): qty 1

## 2017-12-19 MED ORDER — VITAMIN B-12 100 MCG PO TABS
100.0000 ug | ORAL_TABLET | Freq: Every day | ORAL | Status: DC
  Administered 2017-12-19 – 2017-12-20 (×2): 100 ug via ORAL
  Filled 2017-12-19 (×2): qty 1

## 2017-12-19 NOTE — Progress Notes (Signed)
PROGRESS NOTE    [name] [name]  BJS:[id number] DOB: 06/30/1941 DOA: 12/17/2017 PCP: Prince Solian, MD    Brief Narrative: 77 year old male with history of prostrate cancer, dyslipidemia, diabetes, peripheral neuropathy, COPD on home oxygen 2 L, iron deficiency anemia, who presents today with generalized weakness and recurrent hypoglycemia. Patient brought in by family and states that he has had 3-4 episodes of hypoglycemia over the last 1 week, during which  he gets very symptomatic with lightheadedness, diarrhea, nausea, clammy, diaphoretic. Today his CBG was in the 30s and he could not increase it with several dietary interventions, called EMS and brought him to the ED. It is unclear as to what patient's antidiabetic regimen at home is. Talked with his wife and he has been taking metformin XL 1000 mg twice a day and glipizide 10 mg twice a day. Dose recently it adjusted by PCP. Family states that his oral intake has been extremely poor secondary to not having any appetite. Patient is also found to be quite anemic and has never had a colonoscopy by choice. Is status post transfusion with 1 unit of packed red blood cells Chest x-ray shows bibasilar opacities.   Assessment & Plan:   Principal Problem:   Hypoglycemia Active Problems:   H/O prostate cancer   Diabetes mellitus (HCC)   HTN (hypertension)   History of tobacco abuse   HLD (hyperlipidemia)   Depression   Chronic kidney disease  1-Hypoglycemia; DM; now with hyperglycemia.  Hold oral hypoglycemic agents.  Resolved.  His appetite is back.  I will resume low dose glipizide, observed in the hospital.   Anemia; B 12 deficiency;  Started  B 12 supplement. Iron supplement. No evidence of bleeding.  Hb still low.  He received one unit PRBC.  He wants to hold on blood transfusion at this time.  Will start ferrous sulfate.   PNA;  Continue with ceftriaxone and azithromycin.   HTN; Continue with lasix.   HLD; on  statins.   Chronic kidney disease stage 2   baseline creatinine 1-1.3 current Cr is 1.55, slightly acute. Monitor on lasix.   COPD on home O2   Start schedule nebulizer.   Depression and anxiety Continue home Prozac and Valium  Prostate cancer   follow as OP  Continue Proscar       DVT prophylaxis: SCD Code Status: Full code.  Family Communication: care discussed with patient.  Disposition Plan: home in 24 hours if hb improved.   Consultants:   none   Procedures:   none   Antimicrobials:   Ceftriaxone  Azithromycin.    Subjective: He is feeling well, breathing better. denies weakness.s   Objective: Vitals:   12/19/17 0449 12/19/17 0500 12/19/17 0724 12/19/17 1427  BP: (!) 135/58     Pulse: 92     Resp: 20     Temp: (!) 97.5 F (36.4 C)     TempSrc: Oral     SpO2: 96%  96% 92%  Weight:  93.3 kg (205 lb 11 oz)    Height:        Intake/Output Summary (Last 24 hours) at 12/19/2017 1444 Last data filed at 12/18/2017 2100 Gross per 24 hour  Intake 470 ml  Output -  Net 470 ml   Filed Weights   12/18/17 0429 12/18/17 0733 12/19/17 0500  Weight: 94 kg (207 lb 3.7 oz) 93.3 kg (205 lb 11 oz) 93.3 kg (205 lb 11 oz)    Examination:  General exam: NAD Respiratory  system: Normal respiratory effort, no wheezing.  Cardiovascular system: S 1, S 2 RRR Gastrointestinal system: BS present, soft, nt Central nervous system: non focal.  Extremities: Symmetric power.  Skin: no rash    Data Reviewed: I have personally reviewed following labs and imaging studies  CBC: Recent Labs  Lab 12/17/17 0043 12/17/17 0534 12/17/17 1058 12/17/17 1545 12/17/17 2036 12/18/17 0227 12/19/17 0407  WBC 15.2*  --   --   --   --  9.1 8.7  NEUTROABS 12.6*  --   --   --   --   --   --   HGB 8.2* 7.3* 8.1* 8.1* 8.0* 7.9* 7.6*  HCT 27.5* 24.7*  --   --   --  26.4* 25.9*  MCV 80.6  --   --   --   --  79.5 79.7  PLT 418*  --   --   --   --  329 161   Basic Metabolic  Panel: Recent Labs  Lab 12/17/17 0043 12/18/17 0227 12/19/17 0407  NA 137 136 138  K 4.3 4.4 3.7  CL 98* 100* 101  CO2 24 25 26   GLUCOSE 141* 336* 227*  BUN 21* 25* 25*  CREATININE 1.55* 1.46* 1.30*  CALCIUM 8.8* 8.5* 8.7*   GFR: Estimated Creatinine Clearance: 51.9 mL/min (A) (by C-G formula based on SCr of 1.3 mg/dL (H)). Liver Function Tests: Recent Labs  Lab 12/17/17 0043  AST 19  ALT 8*  ALKPHOS 74  BILITOT 0.5  PROT 6.7  ALBUMIN 2.6*   No results for input(s): LIPASE, AMYLASE in the last 168 hours. No results for input(s): AMMONIA in the last 168 hours. Coagulation Profile: Recent Labs  Lab 12/17/17 0325  INR 1.08   Cardiac Enzymes: Recent Labs  Lab 12/17/17 0043  TROPONINI <0.03   BNP (last 3 results) No results for input(s): PROBNP in the last 8760 hours. HbA1C: No results for input(s): HGBA1C in the last 72 hours. CBG: Recent Labs  Lab 12/18/17 2025 12/19/17 0026 12/19/17 0447 12/19/17 0746 12/19/17 1159  GLUCAP 221* 285* 216* 155* 171*   Lipid Profile: No results for input(s): CHOL, HDL, LDLCALC, TRIG, CHOLHDL, LDLDIRECT in the last 72 hours. Thyroid Function Tests: Recent Labs    12/17/17 1058  TSH 0.195*   Anemia Panel: Recent Labs    12/17/17 1058  VITAMINB12 143*  FOLATE 7.1  FERRITIN 86  TIBC 260  IRON 108  RETICCTPCT 1.6   Sepsis Labs: Recent Labs  Lab 12/17/17 0534 12/17/17 1058 12/17/17 1545  PROCALCITON  --  0.22  --   LATICACIDVEN 2.3* 2.9* 1.0    Recent Results (from the past 240 hour(s))  Culture, blood (Routine X 2) w Reflex to ID Panel     Status: None (Preliminary result)   Collection Time: 12/17/17 10:59 AM  Result Value Ref Range Status   Specimen Description BLOOD LEFT ARM  Final   Special Requests   Final    BOTTLES DRAWN AEROBIC AND ANAEROBIC Blood Culture adequate volume   Culture   Final    NO GROWTH 2 DAYS Performed at [facility], [place] [address] [place], [place] [address]     Report Status PENDING  Incomplete  Culture, blood (Routine X 2) w Reflex to ID Panel     Status: None (Preliminary result)   Collection Time: 12/17/17 11:10 AM  Result Value Ref Range Status   Specimen Description BLOOD RIGHT ARM  Final   Special Requests  Final    BOTTLES DRAWN AEROBIC AND ANAEROBIC Blood Culture adequate volume   Culture   Final    NO GROWTH 2 DAYS Performed at [facility], [place] [address], [place], [place] [address]    Report Status PENDING  Incomplete         Radiology Studies: No results found.      Scheduled Meds: . atorvastatin  10 mg Oral Daily  . cholecalciferol  1,000 Units Oral Daily  . ferrous sulfate  325 mg Oral BID WC  . finasteride  5 mg Oral Daily  . FLUoxetine  60 mg Oral q morning - 10a  . furosemide  20 mg Oral Daily  . gabapentin  300 mg Oral QHS  . glipiZIDE  2.5 mg Oral BID AC  . insulin aspart  0-6 Units Subcutaneous Q4H  . ipratropium-albuterol  3 mL Nebulization Q6H  . multivitamin with minerals  1 tablet Oral Daily  . mupirocin ointment  1 application Nasal BID  . nicotine  7 mg Transdermal Daily  . pantoprazole  40 mg Oral Daily  . protein supplement shake  11 oz Oral BID BM  . silver sulfADIAZINE  1 application Topical BID  . vitamin B-12  100 mcg Oral Daily   Continuous Infusions: . azithromycin Stopped (12/19/17 1346)  . cefTRIAXone (ROCEPHIN)  IV Stopped (12/19/17 1049)     LOS: 0 days    Time spent: 35 minutes.     Elmarie Shiley, MD [facility] Pager [phone]  If 7PM-7AM, please contact night-coverage [email or web address] Password [facility] 12/19/2017, 2:44 PM

## 2017-12-20 DIAGNOSIS — J449 Chronic obstructive pulmonary disease, unspecified: Secondary | ICD-10-CM | POA: Diagnosis not present

## 2017-12-20 DIAGNOSIS — R0902 Hypoxemia: Secondary | ICD-10-CM | POA: Diagnosis not present

## 2017-12-20 DIAGNOSIS — E162 Hypoglycemia, unspecified: Secondary | ICD-10-CM | POA: Diagnosis not present

## 2017-12-20 LAB — CBC
HCT: 29.7 % — ABNORMAL LOW (ref 39.0–52.0)
HEMOGLOBIN: 9 g/dL — AB (ref 13.0–17.0)
MCH: 24.5 pg — AB (ref 26.0–34.0)
MCHC: 30.3 g/dL (ref 30.0–36.0)
MCV: 80.7 fL (ref 78.0–100.0)
PLATELETS: 374 10*3/uL (ref 150–400)
RBC: 3.68 MIL/uL — ABNORMAL LOW (ref 4.22–5.81)
RDW: 16.2 % — AB (ref 11.5–15.5)
WBC: 10.9 10*3/uL — ABNORMAL HIGH (ref 4.0–10.5)

## 2017-12-20 LAB — GLUCOSE, CAPILLARY
GLUCOSE-CAPILLARY: 150 mg/dL — AB (ref 65–99)
Glucose-Capillary: 172 mg/dL — ABNORMAL HIGH (ref 65–99)
Glucose-Capillary: 186 mg/dL — ABNORMAL HIGH (ref 65–99)

## 2017-12-20 LAB — T4, FREE: FREE T4: 1.19 ng/dL — AB (ref 0.61–1.12)

## 2017-12-20 MED ORDER — AMOXICILLIN-POT CLAVULANATE 875-125 MG PO TABS: 1.0000 | ORAL_TABLET | Freq: Two times a day (BID) | ORAL | 0 refills | Status: DC

## 2017-12-20 MED ORDER — ALBUTEROL SULFATE (2.5 MG/3ML) 0.083% IN NEBU: 2.5000 mg | INHALATION_SOLUTION | Freq: Four times a day (QID) | RESPIRATORY_TRACT | 12 refills | Status: DC

## 2017-12-20 MED ORDER — CYANOCOBALAMIN 100 MCG PO TABS: 100.0000 ug | ORAL_TABLET | Freq: Every day | ORAL | 0 refills | Status: DC

## 2017-12-20 MED ORDER — NICOTINE 7 MG/24HR TD PT24: 7.0000 mg | MEDICATED_PATCH | Freq: Every day | TRANSDERMAL | 0 refills | Status: DC

## 2017-12-20 MED ORDER — FERROUS SULFATE 325 (65 FE) MG PO TABS: 325.0000 mg | ORAL_TABLET | Freq: Every day | ORAL | 0 refills | Status: DC

## 2017-12-20 MED ORDER — CYANOCOBALAMIN 100 MCG PO TABS: 100.0000 ug | ORAL_TABLET | Freq: Every day | ORAL | 0 refills | Status: AC

## 2017-12-20 MED ORDER — ADULT MULTIVITAMIN W/MINERALS CH: 1.0000 | ORAL_TABLET | Freq: Every day | ORAL | 0 refills | Status: AC

## 2017-12-20 MED ORDER — PREDNISONE 20 MG PO TABS: 20.0000 mg | ORAL_TABLET | Freq: Every day | ORAL | 0 refills | Status: DC

## 2017-12-20 MED ORDER — ALBUTEROL SULFATE (2.5 MG/3ML) 0.083% IN NEBU: 2.5000 mg | INHALATION_SOLUTION | Freq: Four times a day (QID) | RESPIRATORY_TRACT | 1 refills | Status: AC

## 2017-12-20 MED ORDER — PREDNISONE 20 MG PO TABS: 20.0000 mg | ORAL_TABLET | Freq: Every day | ORAL | 0 refills | Status: AC

## 2017-12-20 MED ORDER — ADULT MULTIVITAMIN W/MINERALS CH: 1.0000 | ORAL_TABLET | Freq: Every day | ORAL | 0 refills | Status: DC

## 2017-12-20 MED ORDER — AMOXICILLIN-POT CLAVULANATE 875-125 MG PO TABS: 1.0000 | ORAL_TABLET | Freq: Two times a day (BID) | ORAL | 0 refills | Status: AC

## 2017-12-20 MED ORDER — GLIPIZIDE 5 MG PO TABS: 2.5000 mg | ORAL_TABLET | Freq: Two times a day (BID) | ORAL | 0 refills | Status: AC

## 2017-12-20 MED ORDER — NICOTINE 7 MG/24HR TD PT24: 7.0000 mg | MEDICATED_PATCH | Freq: Every day | TRANSDERMAL | 0 refills | Status: AC

## 2017-12-20 MED ORDER — SENNOSIDES-DOCUSATE SODIUM 8.6-50 MG PO TABS: 1.0000 | ORAL_TABLET | Freq: Every day | ORAL | 0 refills | Status: DC | PRN

## 2017-12-20 MED ORDER — SENNOSIDES-DOCUSATE SODIUM 8.6-50 MG PO TABS: 1.0000 | ORAL_TABLET | Freq: Every day | ORAL | 0 refills | Status: AC | PRN

## 2017-12-20 MED ORDER — GLIPIZIDE 5 MG PO TABS: 2.5000 mg | ORAL_TABLET | Freq: Two times a day (BID) | ORAL | 0 refills | Status: DC

## 2017-12-20 MED ORDER — FERROUS SULFATE 325 (65 FE) MG PO TABS: 325.0000 mg | ORAL_TABLET | Freq: Every day | ORAL | 0 refills | Status: AC

## 2017-12-20 MED ORDER — PREDNISONE 20 MG PO TABS
20.0000 mg | ORAL_TABLET | Freq: Every day | ORAL | Status: DC
  Administered 2017-12-20: 20 mg via ORAL
  Filled 2017-12-20: qty 1

## 2017-12-20 NOTE — Progress Notes (Signed)
Pt being discharged home via wheelchair with family. Pt alert and oriented x4. VSS. Pt c/o no pain at this time. No signs of respiratory distress. Pt on 2L/[place] of home oxygen. Education complete and care plans resolved. IV removed with catheter intact and pt tolerated well. No further issues at this time. Pt to follow up with PCP. Leanne Chang, RN

## 2017-12-20 NOTE — Discharge Summary (Signed)
Physician Discharge Summary  [name] [name] TGG:[id number] DOB: November 06, 1940 DOA: 12/17/2017  PCP: Prince Solian, MD  Admit date: 12/17/2017 Discharge date: 12/20/2017  Admitted From: Home  Disposition:  Home   Recommendations for Outpatient Follow-up:  1. Follow up with PCP in 1-2 weeks 2. Please obtain BMP/CBC in one week 3. Needs repeat TSH and free T 3 and  T 4. Please follow results of T 4.   Home Health: yes.   Discharge Condition: stable.  CODE STATUS: full code.  Diet recommendation: Heart Healthy   Brief/Interim Summary:  Brief Narrative: 77 year old male with history of prostrate cancer, dyslipidemia, diabetes, peripheral neuropathy, COPD on home oxygen 2 L, iron deficiency anemia, who presents today with generalized weakness and recurrent hypoglycemia. Patient brought in by family and states that he has had 3-4 episodes of hypoglycemia over the last 1 week, during which he gets very symptomatic with lightheadedness, diarrhea, nausea, clammy, diaphoretic. Today his CBG was in the 30s and he could not increase it with several dietary interventions, called EMS and brought him to the ED. It is unclear as to what patient's antidiabetic regimen at home is. Talked with his wife and he has been taking metformin XL 1000 mg twice a day and glipizide 10 mg twice a day. Dose recently it adjusted by PCP. Family states that his oral intake has been extremely poor secondary to not having any appetite. Patient is also found to be quite anemic and has never had a colonoscopy by choice. Is status post transfusion with 1 unit of packed red blood cells Chest x-ray shows bibasilar opacities.   Assessment & Plan:   Principal Problem:   Hypoglycemia Active Problems:   H/O prostate cancer   Diabetes mellitus (HCC)   HTN (hypertension)   History of tobacco abuse   HLD (hyperlipidemia)   Depression   Chronic kidney disease  1-Hypoglycemia; DM; now with hyperglycemia.  Hold oral  hypoglycemic agents.  Resolved.  His appetite is back.  He will be discharge on low dose glipizide. Hold metformin due to mild increase cr. No hypoglycemia on glipizide.   Anemia; B 12 deficiency;  Started  B 12 supplement. Iron supplement. No evidence of bleeding.  He received one unit PRBC.  Started  ferrous sulfate.  discharge on B 12.  Needs repeat labs, hb increase to 9.   PNA;  Treated with with ceftriaxone and azithromycin for 3 days. Discharge on Augmentin for 4 days.   HTN; Continue with lasix.   HLD; on statins.   Chronic kidney disease stage2baseline creatinine1-1.3 current Cr is1.55, slightly acute. Monitor on lasix.   COPDon home O2 Continue with  schedule nebulizer.  taper prednisone.   Depressionand anxiety Continue homeProzac and Valium  Prostate cancer  follow as OP  Continue Proscar     Discharge Diagnoses:  Principal Problem:   Hypoglycemia Active Problems:   H/O prostate cancer   Diabetes mellitus ([place])   HTN (hypertension)   History of tobacco abuse   HLD (hyperlipidemia)   Depression   Chronic kidney disease    Discharge Instructions  Discharge Instructions    Diet - low sodium heart healthy   Complete by:  As directed    Increase activity slowly   Complete by:  As directed      Allergies as of 12/20/2017      Reactions   Oxycodone Other (See Comments)   Only intolerant when takes with Diphenhydramine      Medication List  STOP taking these medications   hydrochlorothiazide 25 MG tablet Commonly known as:  HYDRODIURIL   lisinopril 20 MG tablet Commonly known as:  PRINIVIL,ZESTRIL   metFORMIN 1000 MG tablet Commonly known as:  GLUCOPHAGE     TAKE these medications   albuterol (2.5 MG/3ML) 0.083% nebulizer solution Commonly known as:  PROVENTIL Take 3 mLs (2.5 mg total) by nebulization every 6 (six) hours. What changed:    when to take this  reasons to take this    amoxicillin-clavulanate 875-125 MG tablet Commonly known as:  AUGMENTIN Take 1 tablet by mouth 2 (two) times daily for 4 days.   aspirin 325 MG tablet Take 325 mg by mouth daily.   atorvastatin 10 MG tablet Commonly known as:  LIPITOR Take 10 mg by mouth daily.   budesonide-formoterol 160-4.5 MCG/ACT inhaler Commonly known as:  SYMBICORT Inhale 2 puffs into the lungs 2 (two) times daily.   cholecalciferol 1000 units tablet Commonly known as:  VITAMIN D Take 1,000 Units by mouth daily.   cyanocobalamin 100 MCG tablet Take 1 tablet (100 mcg total) by mouth daily. Start taking on:  12/21/2017   diazepam 5 MG tablet Commonly known as:  VALIUM Take 5 mg by mouth every 8 (eight) hours as needed for anxiety.   ferrous sulfate 325 (65 FE) MG tablet Take 1 tablet (325 mg total) by mouth daily with breakfast. Take with Calcium   finasteride 5 MG tablet Commonly known as:  PROSCAR Take 5 mg by mouth daily.   FLUoxetine 40 MG capsule Commonly known as:  PROZAC Take 60 mg by mouth every morning.   furosemide 20 MG tablet Commonly known as:  LASIX Take 20 mg by mouth daily.   gabapentin 300 MG capsule Commonly known as:  NEURONTIN Take 300 mg by mouth at bedtime.   glipiZIDE 5 MG tablet Commonly known as:  GLUCOTROL Take 0.5 tablets (2.5 mg total) by mouth 2 (two) times daily before a meal. What changed:    medication strength  how much to take   hydroxypropyl methylcellulose / hypromellose 2.5 % ophthalmic solution Commonly known as:  ISOPTO TEARS / GONIOVISC Place 1 drop into both eyes as needed (for dryness).   multivitamin with minerals Tabs tablet Take 1 tablet by mouth daily. Start taking on:  12/21/2017   mupirocin ointment 2 % Commonly known as:  BACTROBAN Place 1 application into the nose 2 (two) times daily.   nicotine 7 mg/24hr patch Commonly known as:  NICODERM CQ - dosed in mg/24 hr Place 1 patch (7 mg total) onto the skin daily. Start taking on:   12/21/2017   oxyCODONE-acetaminophen 5-325 MG tablet Commonly known as:  PERCOCET/ROXICET Take one tablet by mouth every 4 hours as needed for severe pain. Caution for altered mental status   predniSONE 20 MG tablet Commonly known as:  DELTASONE Take 1 tablet (20 mg total) by mouth daily with breakfast.   senna-docusate 8.6-50 MG tablet Commonly known as:  Senokot-S Take 1 tablet by mouth daily as needed for mild constipation.   silver sulfADIAZINE 1 % cream Commonly known as:  SILVADENE Apply 1 application topically 2 (two) times daily.   tiotropium 18 MCG inhalation capsule Commonly known as:  SPIRIVA Place 18 mcg into inhaler and inhale daily.       Allergies  Allergen Reactions  . Oxycodone Other (See Comments)    Only intolerant when takes with Diphenhydramine    Consultations:  none   Procedures/Studies: Dg Chest 2  View  Result Date: 12/17/2017 CLINICAL DATA:  Acute onset of cough. EXAM: CHEST - 2 VIEW COMPARISON:  Chest radiograph performed 11/15/2017 FINDINGS: The lungs are well-aerated. Mild bibasilar opacities may reflect atelectasis or possibly mild pneumonia. There is no evidence of pleural effusion or pneumothorax. The heart is borderline enlarged. No acute osseous abnormalities are seen. IMPRESSION: Mild bibasilar opacities may reflect atelectasis or possibly mild pneumonia. Borderline cardiomegaly. Electronically Signed   By: Garald Balding M.D.   On: 12/17/2017 01:36   Ct Chest Wo Contrast  Result Date: 12/17/2017 CLINICAL DATA:  Chest pain and shortness of Breath EXAM: CT CHEST WITHOUT CONTRAST TECHNIQUE: Multidetector CT imaging of the chest was performed following the standard protocol without IV contrast. COMPARISON:  Chest x-ray from earlier in the same day FINDINGS: Cardiovascular: Thoracic aorta demonstrates atherosclerotic calcifications without significant aneurysmal dilatation. Coronary calcifications are seen. No cardiac enlargement is noted.  Mediastinum/Nodes: Thoracic inlet is within normal limits. No hilar or mediastinal adenopathy is seen. The esophagus is within normal limits. Lungs/Pleura: Left lower lobe infiltrate is noted. No associated effusion is seen. No significant parenchymal nodules are noted. Upper Abdomen: Visualized upper abdomen is within normal limits. Musculoskeletal: Biapical pleural and parenchymal scarring is seen. Emphysematous changes are noted. IMPRESSION: Left lower lobe infiltrate. Aortic Atherosclerosis (ICD10-I70.0) and Emphysema (ICD10-J43.9). Electronically Signed   By: Inez Catalina M.D.   On: 12/17/2017 14:21       Subjective: He is feeling well, denies weakness. Breathing at baseline./   Discharge Exam: Vitals:   12/20/17 0458 12/20/17 0749  BP: 137/80   Pulse: (!) 108   Resp: 15   Temp: 98 F (36.7 C)   SpO2: 96% 98%   Vitals:   12/19/17 2141 12/20/17 0237 12/20/17 0458 12/20/17 0749  BP: 127/62  137/80   Pulse: (!) 107 (!) 111 (!) 108   Resp: 20 20 15    Temp: 98.2 F (36.8 C)  98 F (36.7 C)   TempSrc: Oral  Oral   SpO2: 99% 98% 96% 98%  Weight:      Height:        General: Pt is alert, awake, not in acute distress Cardiovascular: RRR, S1/S2 +, no rubs, no gallops Respiratory: CTA bilaterally, sporadic  wheezing, no rhonchi Abdominal: Soft, NT, ND, bowel sounds + Extremities: no edema, no cyanosis    The results of significant diagnostics from this hospitalization (including imaging, microbiology, ancillary and laboratory) are listed below for reference.     Microbiology: Recent Results (from the past 240 hour(s))  Culture, blood (Routine X 2) w Reflex to ID Panel     Status: None (Preliminary result)   Collection Time: 12/17/17 10:59 AM  Result Value Ref Range Status   Specimen Description BLOOD LEFT ARM  Final   Special Requests   Final    BOTTLES DRAWN AEROBIC AND ANAEROBIC Blood Culture adequate volume   Culture   Final    NO GROWTH 2 DAYS Performed at [facility], [address], [place], [place] [address]    Report Status PENDING  Incomplete  Culture, blood (Routine X 2) w Reflex to ID Panel     Status: None (Preliminary result)   Collection Time: 12/17/17 11:10 AM  Result Value Ref Range Status   Specimen Description BLOOD RIGHT ARM  Final   Special Requests   Final    BOTTLES DRAWN AEROBIC AND ANAEROBIC Blood Culture adequate volume   Culture   Final    NO  GROWTH 2 DAYS Performed at [facility], [place] [address], [place],  [address]    Report Status PENDING  Incomplete     Labs: BNP (last 3 results) Recent Labs    12/17/17 0043  BNP 92.1   Basic Metabolic Panel: Recent Labs  Lab 12/17/17 0043 12/18/17 0227 12/19/17 0407 12/19/17 1458  NA 137 136 138 139  K 4.3 4.4 3.7 3.9  CL 98* 100* 101 100*  CO2 24 25 26 28   GLUCOSE 141* 336* 227* 224*  BUN 21* 25* 25* 24*  CREATININE 1.55* 1.46* 1.30* 1.38*  CALCIUM 8.8* 8.5* 8.7* 8.9   Liver Function Tests: Recent Labs  Lab 12/17/17 0043  AST 19  ALT 8*  ALKPHOS 74  BILITOT 0.5  PROT 6.7  ALBUMIN 2.6*   No results for input(s): LIPASE, AMYLASE in the last 168 hours. No results for input(s): AMMONIA in the last 168 hours. CBC: Recent Labs  Lab 12/17/17 0043 12/17/17 0534  12/17/17 1545 12/17/17 2036 12/18/17 0227 12/19/17 0407 12/20/17 0645  WBC 15.2*  --   --   --   --  9.1 8.7 10.9*  NEUTROABS 12.6*  --   --   --   --   --   --   --   HGB 8.2* 7.3*   < > 8.1* 8.0* 7.9* 7.6* 9.0*  HCT 27.5* 24.7*  --   --   --  26.4* 25.9* 29.7*  MCV 80.6  --   --   --   --  79.5 79.7 80.7  PLT 418*  --   --   --   --  329 287 374   < > = values in this interval not displayed.   Cardiac Enzymes: Recent Labs  Lab 12/17/17 0043  TROPONINI <0.03   BNP: Invalid input(s): POCBNP CBG: Recent Labs  Lab 12/19/17 1656 12/19/17 2137 12/20/17 0042 12/20/17 0401 12/20/17 0808  GLUCAP 217* 147* 150* 172* 186*   D-Dimer No results for input(s): DDIMER  in the last 72 hours. Hgb A1c No results for input(s): HGBA1C in the last 72 hours. Lipid Profile No results for input(s): CHOL, HDL, LDLCALC, TRIG, CHOLHDL, LDLDIRECT in the last 72 hours. Thyroid function studies Recent Labs    12/17/17 1058  TSH 0.195*   Anemia work up Recent Labs    12/17/17 1058  VITAMINB12 143*  FOLATE 7.1  FERRITIN 86  TIBC 260  IRON 108  RETICCTPCT 1.6   Urinalysis    Component Value Date/Time   COLORURINE YELLOW 12/17/2017 [place] 12/17/2017 0535   LABSPEC 1.011 12/17/2017 0535   PHURINE 5.0 12/17/2017 0535   GLUCOSEU NEGATIVE 12/17/2017 0535   HGBUR NEGATIVE 12/17/2017 0535   BILIRUBINUR NEGATIVE 12/17/2017 0535   KETONESUR NEGATIVE 12/17/2017 0535   PROTEINUR NEGATIVE 12/17/2017 0535   UROBILINOGEN 1.0 09/04/2014 2000   NITRITE NEGATIVE 12/17/2017 0535   LEUKOCYTESUR NEGATIVE 12/17/2017 0535   Sepsis Labs Invalid input(s): PROCALCITONIN,  WBC,  LACTICIDVEN Microbiology Recent Results (from the past 240 hour(s))  Culture, blood (Routine X 2) w Reflex to ID Panel     Status: None (Preliminary result)   Collection Time: 12/17/17 10:59 AM  Result Value Ref Range Status   Specimen Description BLOOD LEFT ARM  Final   Special Requests   Final    BOTTLES DRAWN AEROBIC AND ANAEROBIC Blood Culture adequate volume   Culture   Final    NO GROWTH 2 DAYS Performed at  [facility], [place] [address] [place], [place] [address]    Report Status PENDING  Incomplete  Culture, blood (Routine X 2) w Reflex to ID Panel     Status: None (Preliminary result)   Collection Time: 12/17/17 11:10 AM  Result Value Ref Range Status   Specimen Description BLOOD RIGHT ARM  Final   Special Requests   Final    BOTTLES DRAWN AEROBIC AND ANAEROBIC Blood Culture adequate volume   Culture   Final    NO GROWTH 2 DAYS Performed at [facility], [place] [address], [place], [place] [address]    Report Status PENDING  Incomplete     Time  coordinating discharge: Over 30 minutes  SIGNED:   Elmarie Shiley, MD  [facility] 12/20/2017, 10:09 AM Pager   If 7PM-7AM, please contact night-coverage [email or web address] Password [email or web address]

## 2017-12-21 DIAGNOSIS — C61 Malignant neoplasm of prostate: Secondary | ICD-10-CM | POA: Diagnosis not present

## 2017-12-21 DIAGNOSIS — J449 Chronic obstructive pulmonary disease, unspecified: Secondary | ICD-10-CM | POA: Diagnosis not present

## 2017-12-21 DIAGNOSIS — I10 Essential (primary) hypertension: Secondary | ICD-10-CM | POA: Diagnosis not present

## 2017-12-21 DIAGNOSIS — M21372 Foot drop, left foot: Secondary | ICD-10-CM | POA: Diagnosis not present

## 2017-12-21 LAB — TYPE AND SCREEN
ABO/RH(D): A NEG
Antibody Screen: NEGATIVE
UNIT DIVISION: 0
Unit division: 0
Unit division: 0

## 2017-12-21 LAB — BPAM RBC
BLOOD PRODUCT EXPIRATION DATE: 201904012359
BLOOD PRODUCT EXPIRATION DATE: 201904022359
Blood Product Expiration Date: 201904012359
ISSUE DATE / TIME: 201903150724
UNIT TYPE AND RH: 600
Unit Type and Rh: 600
Unit Type and Rh: 600

## 2017-12-21 LAB — T3, FREE: T3 FREE: 2.9 pg/mL (ref 2.0–4.4)

## 2017-12-22 DIAGNOSIS — J449 Chronic obstructive pulmonary disease, unspecified: Secondary | ICD-10-CM | POA: Diagnosis not present

## 2017-12-22 DIAGNOSIS — D509 Iron deficiency anemia, unspecified: Secondary | ICD-10-CM | POA: Diagnosis not present

## 2017-12-22 DIAGNOSIS — J189 Pneumonia, unspecified organism: Secondary | ICD-10-CM | POA: Diagnosis not present

## 2017-12-22 DIAGNOSIS — N183 Chronic kidney disease, stage 3 (moderate): Secondary | ICD-10-CM | POA: Diagnosis not present

## 2017-12-22 DIAGNOSIS — F39 Unspecified mood [affective] disorder: Secondary | ICD-10-CM | POA: Diagnosis not present

## 2017-12-22 DIAGNOSIS — Z6830 Body mass index (BMI) 30.0-30.9, adult: Secondary | ICD-10-CM | POA: Diagnosis not present

## 2017-12-22 DIAGNOSIS — E162 Hypoglycemia, unspecified: Secondary | ICD-10-CM | POA: Diagnosis not present

## 2017-12-22 LAB — CULTURE, BLOOD (ROUTINE X 2)
CULTURE: NO GROWTH
Culture: NO GROWTH
SPECIAL REQUESTS: ADEQUATE
Special Requests: ADEQUATE

## 2017-12-27 DIAGNOSIS — J449 Chronic obstructive pulmonary disease, unspecified: Secondary | ICD-10-CM | POA: Diagnosis not present

## 2017-12-27 DIAGNOSIS — N189 Chronic kidney disease, unspecified: Secondary | ICD-10-CM | POA: Diagnosis not present

## 2017-12-27 DIAGNOSIS — E1122 Type 2 diabetes mellitus with diabetic chronic kidney disease: Secondary | ICD-10-CM | POA: Diagnosis not present

## 2017-12-27 DIAGNOSIS — Z7951 Long term (current) use of inhaled steroids: Secondary | ICD-10-CM | POA: Diagnosis not present

## 2017-12-27 DIAGNOSIS — I129 Hypertensive chronic kidney disease with stage 1 through stage 4 chronic kidney disease, or unspecified chronic kidney disease: Secondary | ICD-10-CM | POA: Diagnosis not present

## 2017-12-27 DIAGNOSIS — E785 Hyperlipidemia, unspecified: Secondary | ICD-10-CM | POA: Diagnosis not present

## 2017-12-27 DIAGNOSIS — F329 Major depressive disorder, single episode, unspecified: Secondary | ICD-10-CM | POA: Diagnosis not present

## 2017-12-27 DIAGNOSIS — Z7982 Long term (current) use of aspirin: Secondary | ICD-10-CM | POA: Diagnosis not present

## 2017-12-27 DIAGNOSIS — Z7984 Long term (current) use of oral hypoglycemic drugs: Secondary | ICD-10-CM | POA: Diagnosis not present

## 2017-12-28 DIAGNOSIS — Z7982 Long term (current) use of aspirin: Secondary | ICD-10-CM | POA: Diagnosis not present

## 2017-12-28 DIAGNOSIS — F329 Major depressive disorder, single episode, unspecified: Secondary | ICD-10-CM | POA: Diagnosis not present

## 2017-12-28 DIAGNOSIS — Z7951 Long term (current) use of inhaled steroids: Secondary | ICD-10-CM | POA: Diagnosis not present

## 2017-12-28 DIAGNOSIS — J449 Chronic obstructive pulmonary disease, unspecified: Secondary | ICD-10-CM | POA: Diagnosis not present

## 2017-12-28 DIAGNOSIS — N189 Chronic kidney disease, unspecified: Secondary | ICD-10-CM | POA: Diagnosis not present

## 2017-12-28 DIAGNOSIS — Z7984 Long term (current) use of oral hypoglycemic drugs: Secondary | ICD-10-CM | POA: Diagnosis not present

## 2017-12-28 DIAGNOSIS — E785 Hyperlipidemia, unspecified: Secondary | ICD-10-CM | POA: Diagnosis not present

## 2017-12-28 DIAGNOSIS — I129 Hypertensive chronic kidney disease with stage 1 through stage 4 chronic kidney disease, or unspecified chronic kidney disease: Secondary | ICD-10-CM | POA: Diagnosis not present

## 2017-12-28 DIAGNOSIS — E1122 Type 2 diabetes mellitus with diabetic chronic kidney disease: Secondary | ICD-10-CM | POA: Diagnosis not present

## 2017-12-29 ENCOUNTER — Emergency Department (HOSPITAL_COMMUNITY): Payer: Medicare HMO

## 2017-12-29 ENCOUNTER — Other Ambulatory Visit: Payer: Self-pay

## 2017-12-29 ENCOUNTER — Encounter (HOSPITAL_COMMUNITY): Payer: Self-pay | Admitting: *Deleted

## 2017-12-29 ENCOUNTER — Inpatient Hospital Stay (HOSPITAL_COMMUNITY)
Admission: EM | Admit: 2017-12-29 | Discharge: 2018-01-03 | DRG: 603 | Disposition: A | Discharge status: Home Health | Payer: Medicare HMO | Attending: Internal Medicine | Admitting: Internal Medicine

## 2017-12-29 DIAGNOSIS — E11628 Type 2 diabetes mellitus with other skin complications: Secondary | ICD-10-CM | POA: Diagnosis not present

## 2017-12-29 DIAGNOSIS — Z66 Do not resuscitate: Secondary | ICD-10-CM | POA: Diagnosis present

## 2017-12-29 DIAGNOSIS — Z7951 Long term (current) use of inhaled steroids: Secondary | ICD-10-CM

## 2017-12-29 DIAGNOSIS — L03116 Cellulitis of left lower limb: Secondary | ICD-10-CM | POA: Diagnosis not present

## 2017-12-29 DIAGNOSIS — I129 Hypertensive chronic kidney disease with stage 1 through stage 4 chronic kidney disease, or unspecified chronic kidney disease: Secondary | ICD-10-CM | POA: Diagnosis not present

## 2017-12-29 DIAGNOSIS — E11621 Type 2 diabetes mellitus with foot ulcer: Secondary | ICD-10-CM | POA: Diagnosis present

## 2017-12-29 DIAGNOSIS — Z7984 Long term (current) use of oral hypoglycemic drugs: Secondary | ICD-10-CM | POA: Diagnosis not present

## 2017-12-29 DIAGNOSIS — N183 Chronic kidney disease, stage 3 (moderate): Secondary | ICD-10-CM | POA: Diagnosis not present

## 2017-12-29 DIAGNOSIS — E1122 Type 2 diabetes mellitus with diabetic chronic kidney disease: Secondary | ICD-10-CM | POA: Diagnosis present

## 2017-12-29 DIAGNOSIS — Z9981 Dependence on supplemental oxygen: Secondary | ICD-10-CM | POA: Diagnosis not present

## 2017-12-29 DIAGNOSIS — Z7982 Long term (current) use of aspirin: Secondary | ICD-10-CM

## 2017-12-29 DIAGNOSIS — E860 Dehydration: Secondary | ICD-10-CM | POA: Diagnosis present

## 2017-12-29 DIAGNOSIS — C61 Malignant neoplasm of prostate: Secondary | ICD-10-CM | POA: Diagnosis not present

## 2017-12-29 DIAGNOSIS — R531 Weakness: Secondary | ICD-10-CM | POA: Diagnosis not present

## 2017-12-29 DIAGNOSIS — J9811 Atelectasis: Secondary | ICD-10-CM | POA: Diagnosis not present

## 2017-12-29 DIAGNOSIS — F419 Anxiety disorder, unspecified: Secondary | ICD-10-CM | POA: Diagnosis present

## 2017-12-29 DIAGNOSIS — R5383 Other fatigue: Secondary | ICD-10-CM | POA: Diagnosis not present

## 2017-12-29 DIAGNOSIS — D631 Anemia in chronic kidney disease: Secondary | ICD-10-CM | POA: Diagnosis present

## 2017-12-29 DIAGNOSIS — R600 Localized edema: Secondary | ICD-10-CM | POA: Diagnosis present

## 2017-12-29 DIAGNOSIS — Z87891 Personal history of nicotine dependence: Secondary | ICD-10-CM

## 2017-12-29 DIAGNOSIS — L0390 Cellulitis, unspecified: Secondary | ICD-10-CM | POA: Diagnosis not present

## 2017-12-29 DIAGNOSIS — Z7952 Long term (current) use of systemic steroids: Secondary | ICD-10-CM

## 2017-12-29 DIAGNOSIS — L03119 Cellulitis of unspecified part of limb: Secondary | ICD-10-CM

## 2017-12-29 DIAGNOSIS — N189 Chronic kidney disease, unspecified: Secondary | ICD-10-CM | POA: Diagnosis not present

## 2017-12-29 DIAGNOSIS — R5381 Other malaise: Secondary | ICD-10-CM | POA: Diagnosis not present

## 2017-12-29 DIAGNOSIS — I10 Essential (primary) hypertension: Secondary | ICD-10-CM | POA: Diagnosis not present

## 2017-12-29 DIAGNOSIS — L89629 Pressure ulcer of left heel, unspecified stage: Secondary | ICD-10-CM | POA: Diagnosis not present

## 2017-12-29 DIAGNOSIS — Z8546 Personal history of malignant neoplasm of prostate: Secondary | ICD-10-CM | POA: Diagnosis not present

## 2017-12-29 DIAGNOSIS — R404 Transient alteration of awareness: Secondary | ICD-10-CM | POA: Diagnosis not present

## 2017-12-29 DIAGNOSIS — J449 Chronic obstructive pulmonary disease, unspecified: Secondary | ICD-10-CM | POA: Diagnosis not present

## 2017-12-29 DIAGNOSIS — L8990 Pressure ulcer of unspecified site, unspecified stage: Secondary | ICD-10-CM

## 2017-12-29 DIAGNOSIS — N179 Acute kidney failure, unspecified: Secondary | ICD-10-CM | POA: Diagnosis present

## 2017-12-29 DIAGNOSIS — L97429 Non-pressure chronic ulcer of left heel and midfoot with unspecified severity: Secondary | ICD-10-CM | POA: Diagnosis present

## 2017-12-29 DIAGNOSIS — R269 Unspecified abnormalities of gait and mobility: Secondary | ICD-10-CM | POA: Diagnosis not present

## 2017-12-29 DIAGNOSIS — L03115 Cellulitis of right lower limb: Secondary | ICD-10-CM | POA: Diagnosis not present

## 2017-12-29 DIAGNOSIS — E1142 Type 2 diabetes mellitus with diabetic polyneuropathy: Secondary | ICD-10-CM | POA: Diagnosis present

## 2017-12-29 DIAGNOSIS — M6289 Other specified disorders of muscle: Secondary | ICD-10-CM | POA: Diagnosis not present

## 2017-12-29 DIAGNOSIS — R079 Chest pain, unspecified: Secondary | ICD-10-CM | POA: Diagnosis not present

## 2017-12-29 DIAGNOSIS — E785 Hyperlipidemia, unspecified: Secondary | ICD-10-CM | POA: Diagnosis present

## 2017-12-29 DIAGNOSIS — F39 Unspecified mood [affective] disorder: Secondary | ICD-10-CM | POA: Diagnosis not present

## 2017-12-29 DIAGNOSIS — I872 Venous insufficiency (chronic) (peripheral): Secondary | ICD-10-CM | POA: Diagnosis not present

## 2017-12-29 LAB — CBC WITH DIFFERENTIAL/PLATELET
Basophils Absolute: 0 10*3/uL (ref 0.0–0.1)
Basophils Relative: 0 %
EOS PCT: 1 %
Eosinophils Absolute: 0.2 10*3/uL (ref 0.0–0.7)
HEMATOCRIT: 30.6 % — AB (ref 39.0–52.0)
Hemoglobin: 9.2 g/dL — ABNORMAL LOW (ref 13.0–17.0)
LYMPHS PCT: 9 %
Lymphs Abs: 1.3 10*3/uL (ref 0.7–4.0)
MCH: 24.6 pg — AB (ref 26.0–34.0)
MCHC: 30.1 g/dL (ref 30.0–36.0)
MCV: 81.8 fL (ref 78.0–100.0)
MONO ABS: 1.1 10*3/uL — AB (ref 0.1–1.0)
MONOS PCT: 7 %
NEUTROS ABS: 12.9 10*3/uL — AB (ref 1.7–7.7)
Neutrophils Relative %: 83 %
PLATELETS: 335 10*3/uL (ref 150–400)
RBC: 3.74 MIL/uL — ABNORMAL LOW (ref 4.22–5.81)
RDW: 16.5 % — AB (ref 11.5–15.5)
WBC: 15.5 10*3/uL — ABNORMAL HIGH (ref 4.0–10.5)

## 2017-12-29 LAB — LACTIC ACID, PLASMA: Lactic Acid, Venous: 1.4 mmol/L (ref 0.5–1.9)

## 2017-12-29 LAB — COMPREHENSIVE METABOLIC PANEL
ALT: 13 U/L — ABNORMAL LOW (ref 17–63)
ANION GAP: 15 (ref 5–15)
AST: 22 U/L (ref 15–41)
Albumin: 3.1 g/dL — ABNORMAL LOW (ref 3.5–5.0)
Alkaline Phosphatase: 82 U/L (ref 38–126)
BILIRUBIN TOTAL: 0.7 mg/dL (ref 0.3–1.2)
BUN: 21 mg/dL — AB (ref 6–20)
CO2: 29 mmol/L (ref 22–32)
Calcium: 9.5 mg/dL (ref 8.9–10.3)
Chloride: 91 mmol/L — ABNORMAL LOW (ref 101–111)
Creatinine, Ser: 1.61 mg/dL — ABNORMAL HIGH (ref 0.61–1.24)
GFR calc non Af Amer: 39 mL/min — ABNORMAL LOW (ref >60)
GFR, EST AFRICAN AMERICAN: 46 mL/min — AB (ref >60)
Glucose, Bld: 193 mg/dL — ABNORMAL HIGH (ref 65–99)
POTASSIUM: 4.4 mmol/L (ref 3.5–5.1)
Sodium: 135 mmol/L (ref 135–145)
TOTAL PROTEIN: 7.4 g/dL (ref 6.5–8.1)

## 2017-12-29 LAB — I-STAT TROPONIN, ED: Troponin i, poc: 0 ng/mL (ref 0.00–0.08)

## 2017-12-29 LAB — BRAIN NATRIURETIC PEPTIDE: B Natriuretic Peptide: 101.2 pg/mL — ABNORMAL HIGH (ref 0.0–100.0)

## 2017-12-29 LAB — I-STAT CG4 LACTIC ACID, ED
LACTIC ACID, VENOUS: 3.58 mmol/L — AB (ref 0.5–1.9)
Lactic Acid, Venous: 1.05 mmol/L (ref 0.5–1.9)

## 2017-12-29 MED ORDER — SODIUM CHLORIDE 0.9 % IV BOLUS
1000.0000 mL | Freq: Once | INTRAVENOUS | Status: AC
  Administered 2017-12-29: 1000 mL via INTRAVENOUS

## 2017-12-29 MED ORDER — GABAPENTIN 300 MG PO CAPS
300.0000 mg | ORAL_CAPSULE | Freq: Every day | ORAL | Status: DC
  Administered 2017-12-29 – 2018-01-02 (×5): 300 mg via ORAL
  Filled 2017-12-29 (×5): qty 1

## 2017-12-29 MED ORDER — ATORVASTATIN CALCIUM 10 MG PO TABS
10.0000 mg | ORAL_TABLET | Freq: Every day | ORAL | Status: DC
  Administered 2017-12-29 – 2018-01-03 (×6): 10 mg via ORAL
  Filled 2017-12-29 (×6): qty 1

## 2017-12-29 MED ORDER — BUSPIRONE HCL 5 MG PO TABS
10.0000 mg | ORAL_TABLET | Freq: Two times a day (BID) | ORAL | Status: DC
  Administered 2017-12-29 – 2018-01-03 (×10): 10 mg via ORAL
  Filled 2017-12-29: qty 2
  Filled 2017-12-29 (×2): qty 1
  Filled 2017-12-29: qty 2
  Filled 2017-12-29: qty 1
  Filled 2017-12-29 (×2): qty 2
  Filled 2017-12-29: qty 1
  Filled 2017-12-29 (×2): qty 2

## 2017-12-29 MED ORDER — PREDNISONE 20 MG PO TABS
20.0000 mg | ORAL_TABLET | Freq: Every day | ORAL | Status: DC
  Administered 2017-12-30 – 2018-01-03 (×5): 20 mg via ORAL
  Filled 2017-12-29 (×5): qty 1

## 2017-12-29 MED ORDER — ADULT MULTIVITAMIN W/MINERALS CH
1.0000 | ORAL_TABLET | Freq: Every day | ORAL | Status: DC
  Administered 2017-12-30 – 2018-01-01 (×3): 1 via ORAL
  Filled 2017-12-29 (×4): qty 1

## 2017-12-29 MED ORDER — VITAMIN B-12 100 MCG PO TABS
100.0000 ug | ORAL_TABLET | Freq: Every day | ORAL | Status: DC
  Administered 2017-12-30 – 2018-01-03 (×5): 100 ug via ORAL
  Filled 2017-12-29 (×5): qty 1

## 2017-12-29 MED ORDER — INSULIN ASPART 100 UNIT/ML ~~LOC~~ SOLN: 0.0000 [IU] | Freq: Three times a day (TID) | SUBCUTANEOUS | Status: DC

## 2017-12-29 MED ORDER — VANCOMYCIN HCL 10 G IV SOLR
2000.0000 mg | Freq: Once | INTRAVENOUS | Status: AC
  Administered 2017-12-29: 2000 mg via INTRAVENOUS
  Filled 2017-12-29: qty 2000

## 2017-12-29 MED ORDER — NICOTINE 7 MG/24HR TD PT24
7.0000 mg | MEDICATED_PATCH | Freq: Every day | TRANSDERMAL | Status: DC
  Administered 2017-12-31 – 2018-01-03 (×4): 7 mg via TRANSDERMAL
  Filled 2017-12-29 (×5): qty 1

## 2017-12-29 MED ORDER — FERROUS SULFATE 325 (65 FE) MG PO TABS
325.0000 mg | ORAL_TABLET | Freq: Every day | ORAL | Status: DC
  Administered 2017-12-30 – 2018-01-03 (×5): 325 mg via ORAL
  Filled 2017-12-29 (×5): qty 1

## 2017-12-29 MED ORDER — ENOXAPARIN SODIUM 30 MG/0.3ML ~~LOC~~ SOLN: 30.0000 mg | SUBCUTANEOUS | Status: DC

## 2017-12-29 MED ORDER — POLYVINYL ALCOHOL 1.4 % OP SOLN: 1.0000 [drp] | OPHTHALMIC | Status: DC | PRN

## 2017-12-29 MED ORDER — PIPERACILLIN-TAZOBACTAM 3.375 G IVPB 30 MIN
3.3750 g | Freq: Once | INTRAVENOUS | Status: AC
  Administered 2017-12-29: 3.375 g via INTRAVENOUS
  Filled 2017-12-29: qty 50

## 2017-12-29 MED ORDER — TIOTROPIUM BROMIDE MONOHYDRATE 18 MCG IN CAPS
18.0000 ug | ORAL_CAPSULE | Freq: Every day | RESPIRATORY_TRACT | Status: DC
  Administered 2017-12-30 – 2018-01-03 (×4): 18 ug via RESPIRATORY_TRACT
  Filled 2017-12-29 (×2): qty 5

## 2017-12-29 MED ORDER — ASPIRIN 325 MG PO TABS
325.0000 mg | ORAL_TABLET | Freq: Every day | ORAL | Status: DC
  Administered 2017-12-30 – 2018-01-03 (×5): 325 mg via ORAL
  Filled 2017-12-29 (×5): qty 1

## 2017-12-29 MED ORDER — FINASTERIDE 5 MG PO TABS
5.0000 mg | ORAL_TABLET | Freq: Every day | ORAL | Status: DC
  Administered 2017-12-30 – 2018-01-03 (×5): 5 mg via ORAL
  Filled 2017-12-29 (×5): qty 1

## 2017-12-29 MED ORDER — OXYCODONE-ACETAMINOPHEN 5-325 MG PO TABS: 1.0000 | ORAL_TABLET | Freq: Four times a day (QID) | ORAL | Status: DC | PRN

## 2017-12-29 MED ORDER — INSULIN ASPART 100 UNIT/ML ~~LOC~~ SOLN: 0.0000 [IU] | Freq: Every day | SUBCUTANEOUS | Status: DC

## 2017-12-29 MED ORDER — FLUTICASONE FUROATE-VILANTEROL 200-25 MCG/INH IN AEPB
1.0000 | INHALATION_SPRAY | Freq: Every day | RESPIRATORY_TRACT | Status: DC
  Filled 2017-12-29 (×2): qty 28

## 2017-12-29 MED ORDER — GLIPIZIDE 5 MG PO TABS
2.5000 mg | ORAL_TABLET | Freq: Two times a day (BID) | ORAL | Status: DC
  Administered 2017-12-30 – 2018-01-03 (×9): 2.5 mg via ORAL
  Filled 2017-12-29 (×12): qty 1

## 2017-12-29 MED ORDER — SENNOSIDES-DOCUSATE SODIUM 8.6-50 MG PO TABS: 1.0000 | ORAL_TABLET | Freq: Every day | ORAL | Status: DC | PRN

## 2017-12-29 MED ORDER — SODIUM CHLORIDE 0.9 % IV SOLN
1.0000 g | INTRAVENOUS | Status: DC
  Administered 2017-12-29 – 2017-12-31 (×3): 1 g via INTRAVENOUS
  Filled 2017-12-29 (×3): qty 10
  Filled 2017-12-29 (×2): qty 1

## 2017-12-29 MED ORDER — VITAMIN D3 25 MCG (1000 UNIT) PO TABS
1000.0000 [IU] | ORAL_TABLET | Freq: Every day | ORAL | Status: DC
  Administered 2017-12-30 – 2018-01-03 (×5): 1000 [IU] via ORAL
  Filled 2017-12-29 (×5): qty 1

## 2017-12-29 MED ORDER — SODIUM CHLORIDE 0.9 % IV SOLN
Freq: Once | INTRAVENOUS | Status: AC
  Administered 2017-12-29: 17:00:00 via INTRAVENOUS

## 2017-12-29 MED ORDER — INSULIN ASPART 100 UNIT/ML ~~LOC~~ SOLN
0.0000 [IU] | SUBCUTANEOUS | Status: DC
  Administered 2017-12-30 (×2): 2 [IU] via SUBCUTANEOUS

## 2017-12-29 MED ORDER — FLUOXETINE HCL 20 MG PO CAPS
60.0000 mg | ORAL_CAPSULE | Freq: Every day | ORAL | Status: DC
  Administered 2017-12-30 – 2018-01-03 (×5): 60 mg via ORAL
  Filled 2017-12-29 (×5): qty 3

## 2017-12-29 MED ORDER — ALBUTEROL SULFATE (2.5 MG/3ML) 0.083% IN NEBU
2.5000 mg | INHALATION_SOLUTION | Freq: Four times a day (QID) | RESPIRATORY_TRACT | Status: DC
  Administered 2017-12-29 – 2017-12-30 (×4): 2.5 mg via RESPIRATORY_TRACT
  Filled 2017-12-29 (×5): qty 3

## 2017-12-29 MED ORDER — HYPROMELLOSE (GONIOSCOPIC) 2.5 % OP SOLN: 1.0000 [drp] | OPHTHALMIC | Status: DC | PRN

## 2017-12-29 NOTE — H&P (Signed)
History and Physical    [name] [name]:[id number] DOB: November 03, 1940 DOA: 12/29/2017  PCP: Prince Solian, MD Patient coming from: Home, patient lives at home with his elderly wife.  And his son lives around the corner.    Chief Complaint: Generalized weakness HPI: [name] [name] is a 77 y.o. male with medical history significant of prostate cancer, diabetes, peripheral neuropathy, O2 dependent COPD, iron deficiency anemia, lower extremity weakness.  His wife reports that normally he walks with a walker at home inside the house between bed to chair and chair to the bathroom but he has not been able to do that for the last few days.  She reported no fever or chills no nausea vomiting or diarrhea.  His appetite has been poor with decreased p.o. intake.  She is not sure if he is lost weight.  She also reports that symptoms he does not have control over his bowels which has been present for over a month.  He does have chronic prostate issues and prostate cancer so he does have frequency of urination but no burning urination.  Eyes any chest pain, productive cough.  Patient and family also reported that they noticed a red patch in the left shin yesterday.  Patient was admitted 12/17/2017 with similar complaints of generalized weakness which was thought to be due to hypoglycemia.  At that time he had almost the same symptoms as the current presentation.  Patient had extremely poor intake, chest x-ray showed bibasilar atelectasis at that time he also received a unit of blood transfusion for anemia.  Patient refuses colonoscopy by choice.  He had a 2D echocardiogram ejection fraction was 60-65%.  He he also complained of lower extremity edema.  He had a CT of the chest 315 showed left lower lobe infiltrate so he was admitted at that time and treated for community-acquired pneumonia.  He DENIES any back pain.    ED Course: Patient had elevated lactic acid level of 3.58, white count was 15,000, creatinine was  1.61 which is up from 10 days ago his creatinine was 1.30-1.46, chest x-ray shows bibasilar atelectasis.  She was given IV fluids 1 L and IV antibiotics vancomycin.  UA showed negative leukocytes negative nitrites there is no evidence of UTI.  Blood cultures were done in the ER.  Review of Systems: As per HPI otherwise all other systems reviewed and are negative  Ambulatory Status:  Past Medical History:  Diagnosis Date  . Cancer ([place])   . Diabetes mellitus   . Hyperlipidemia   . Hypertension   . Prostate cancer [facility])     Past Surgical History:  Procedure Laterality Date  . CATARACT EXTRACTION W/ INTRAOCULAR LENS IMPLANT  2007   bilateral  . FOOT SURGERY     left drop foot repair  . SHOULDER OPEN ROTATOR CUFF REPAIR  2010   left    Social History   Socioeconomic History  . Marital status: Married    Spouse name: Not on file  . Number of children: Not on file  . Years of education: Not on file  . Highest education level: Not on file  Occupational History  . Not on file  Social Needs  . Financial resource strain: Not on file  . Food insecurity:    Worry: Not on file    Inability: Not on file  . Transportation needs:    Medical: Not on file    Non-medical: Not on file  Tobacco Use  . Smoking status:  Former Smoker    Last attempt to quit: 04/11/2005    Years since quitting: 12.7  . Smokeless tobacco: Never Used  Substance and Sexual Activity  . Alcohol use: No  . Drug use: No  . Sexual activity: Yes  Lifestyle  . Physical activity:    Days per week: Not on file    Minutes per session: Not on file  . Stress: Not on file  Relationships  . Social connections:    Talks on phone: Not on file    Gets together: Not on file    Attends religious service: Not on file    Active member of club or organization: Not on file    Attends meetings of clubs or organizations: Not on file    Relationship status: Not on file  . Intimate partner violence:    Fear of current or ex  partner: Not on file    Emotionally abused: Not on file    Physically abused: Not on file    Forced sexual activity: Not on file  Other Topics Concern  . Not on file  Social History Narrative  . Not on file    Allergies  Allergen Reactions  . Oxycodone Itching and Other (See Comments)    Only intolerant when takes with Diphenhydramine, altered mental status     No family history on file.   Prior to Admission medications   Medication Sig Start Date End Date Taking? Authorizing Provider  albuterol (PROVENTIL) (2.5 MG/3ML) 0.083% nebulizer solution Take 3 mLs (2.5 mg total) by nebulization every 6 (six) hours. 12/20/17  Yes Regalado, Belkys A, MD  aspirin 325 MG tablet Take 325 mg by mouth daily.   Yes [provider]  atorvastatin (LIPITOR) 10 MG tablet Take 10 mg by mouth daily.   Yes [provider]  budesonide-formoterol (SYMBICORT) 160-4.5 MCG/ACT inhaler Inhale 2 puffs into the lungs 2 (two) times daily. 07/07/13  Yes Tat, Shanon Brow, MD  busPIRone (BUSPAR) 10 MG tablet Take 10 mg by mouth 2 (two) times daily. 12/21/17  Yes [provider]  cholecalciferol (VITAMIN D) 1000 UNITS tablet Take 1,000 Units by mouth daily.   Yes [provider]  cyanocobalamin 100 MCG tablet Take 1 tablet (100 mcg total) by mouth daily. 12/21/17  Yes Regalado, Belkys A, MD  ferrous sulfate 325 (65 FE) MG tablet Take 1 tablet (325 mg total) by mouth daily with breakfast. Take with Calcium 12/20/17  Yes Regalado, Belkys A, MD  finasteride (PROSCAR) 5 MG tablet Take 5 mg by mouth daily.   Yes [provider]  FLUoxetine (PROZAC) 20 MG capsule Take 20 mg by mouth 3 (three) times daily. 12/28/17  Yes [provider]  furosemide (LASIX) 20 MG tablet Take 20 mg by mouth daily.   Yes [provider]  gabapentin (NEURONTIN) 300 MG capsule Take 300 mg by mouth at bedtime.   Yes [provider]  glipiZIDE (GLUCOTROL) 5 MG tablet Take 0.5 tablets (2.5 mg  total) by mouth 2 (two) times daily before a meal. 12/20/17  Yes Regalado, Belkys A, MD  hydroxypropyl methylcellulose (ISOPTO TEARS) 2.5 % ophthalmic solution Place 1 drop into both eyes as needed (for dryness).    Yes [provider]  metFORMIN (GLUCOPHAGE) 1000 MG tablet Take 500 mg by mouth 2 (two) times daily with a meal.   Yes [provider]  Multiple Vitamin (MULTIVITAMIN WITH MINERALS) TABS tablet Take 1 tablet by mouth daily. 12/21/17  Yes Regalado, Belkys A,  MD  nicotine (NICODERM CQ - DOSED IN MG/24 HR) 7 mg/24hr patch Place 1 patch (7 mg total) onto the skin daily. 12/21/17  Yes Regalado, Belkys A, MD  OVER THE COUNTER MEDICATION Apply 1 application topically 2 (two) times daily as needed (RASH). DIAPER/RASH CREAM   Yes [provider]  oxyCODONE-acetaminophen (PERCOCET/ROXICET) 5-325 MG per tablet Take one tablet by mouth every 4 hours as needed for severe pain. Caution for altered mental status 09/11/14  Yes Estill Dooms, MD  predniSONE (DELTASONE) 20 MG tablet Take 1 tablet (20 mg total) by mouth daily with breakfast. 12/20/17  Yes Regalado, Belkys A, MD  senna-docusate (SENOKOT-S) 8.6-50 MG tablet Take 1 tablet by mouth daily as needed for mild constipation. 12/20/17  Yes Regalado, Belkys A, MD  tiotropium (SPIRIVA) 18 MCG inhalation capsule Place 18 mcg into inhaler and inhale daily.   Yes [provider]    Physical Exam: Vitals:   12/29/17 1201 12/29/17 1529 12/29/17 1600  BP: (!) 112/56 (!) 126/111 125/69  Pulse: 96 85 79  Resp: 16 (!) 21 15  Temp: 98.3 F (36.8 C)    TempSrc: Oral    SpO2: 95% 100%      General:  Appears calm and comfortable Eyes:  PERRL, EOMI, normal lids, iris ENT:  grossly normal hearing, lips & tongue, mmm Neck: no LAD, masses or thyromegaly Cardiovascular:  RRR, no m/r/g. No LE edema.  Respiratory:  CTA bilaterally, no w/r/r. Normal respiratory effort. Abdomen:  soft, ntnd, NABS Skin:  no rash or induration  seen on limited exam Musculoskeletal:  grossly normal tone BUE/BLE, good ROM, no bony abnormality.  Erythema noted on the left shin which is warm to touch no tenderness.  Trace edema in bilateral lower extremities. Psychiatric:  grossly normal mood and affect, speech fluent and appropriate, AOx3 Neurologic: CN 2-12 grossly intact, MOVES  all extremities in coordinated fashion, sensation intact  Labs on Admission: I have personally reviewed following labs and imaging studies  CBC: Recent Labs  Lab 12/29/17 1332  WBC 15.5*  NEUTROABS 12.9*  HGB 9.2*  HCT 30.6*  MCV 81.8  PLT 644   Basic Metabolic Panel: Recent Labs  Lab 12/29/17 1332  NA 135  K 4.4  CL 91*  CO2 29  GLUCOSE 193*  BUN 21*  CREATININE 1.61*  CALCIUM 9.5   GFR: Estimated Creatinine Clearance: 41.9 mL/min (A) (by C-G formula based on SCr of 1.61 mg/dL (H)). Liver Function Tests: Recent Labs  Lab 12/29/17 1332  AST 22  ALT 13*  ALKPHOS 82  BILITOT 0.7  PROT 7.4  ALBUMIN 3.1*   No results for input(s): LIPASE, AMYLASE in the last 168 hours. No results for input(s): AMMONIA in the last 168 hours. Coagulation Profile: No results for input(s): INR, PROTIME in the last 168 hours. Cardiac Enzymes: No results for input(s): CKTOTAL, CKMB, CKMBINDEX, TROPONINI in the last 168 hours. BNP (last 3 results) No results for input(s): PROBNP in the last 8760 hours. HbA1C: No results for input(s): HGBA1C in the last 72 hours. CBG: No results for input(s): GLUCAP in the last 168 hours. Lipid Profile: No results for input(s): CHOL, HDL, LDLCALC, TRIG, CHOLHDL, LDLDIRECT in the last 72 hours. Thyroid Function Tests: No results for input(s): TSH, T4TOTAL, FREET4, T3FREE, THYROIDAB in the last 72 hours. Anemia Panel: No results for input(s): VITAMINB12, FOLATE, FERRITIN, TIBC, IRON, RETICCTPCT in the last 72 hours. Urine analysis:    Component Value Date/Time   COLORURINE YELLOW 12/17/2017  [place] 12/17/2017 0535   LABSPEC 1.011 12/17/2017 0535   PHURINE 5.0 12/17/2017 0535   GLUCOSEU NEGATIVE 12/17/2017 0535   HGBUR NEGATIVE 12/17/2017 0535   BILIRUBINUR NEGATIVE 12/17/2017 [place] 12/17/2017 0535   PROTEINUR NEGATIVE 12/17/2017 0535   UROBILINOGEN 1.0 09/04/2014 2000   NITRITE NEGATIVE 12/17/2017 0535   LEUKOCYTESUR NEGATIVE 12/17/2017 0535    Creatinine Clearance: Estimated Creatinine Clearance: 41.9 mL/min (A) (by C-G formula based on SCr of 1.61 mg/dL (H)).  Sepsis Labs: @LABRCNTIP (procalcitonin:4,lacticidven:4) )No results found for this or any previous visit (from the past 240 hour(s)).   Radiological Exams on Admission: Dg Chest 2 View  Result Date: 12/29/2017 CLINICAL DATA:  Chest pain. EXAM: CHEST - 2 VIEW COMPARISON:  Radiographs of December 17, 2017. FINDINGS: Stable cardiomediastinal silhouette. No pneumothorax is noted. Mild bibasilar subsegmental atelectasis is noted. No significant pleural effusion is noted. Bony thorax is unremarkable. IMPRESSION: Mild bibasilar subsegmental atelectasis. Electronically Signed   By: Marijo Conception, M.D.   On: 12/29/2017 14:09    EKG: Independently reviewed. Assessment/Plan Active Problems:   * No active hospital problems. *  1] cellulitis of the left lower extremity patient has received a dose of vancomycin in the ER already.  I will start him on Rocephin 1 g daily.  This is probably contributing to his leukocytosis and elevated lactic acid level.  He is also dehydrated.  His appetite has been poor with decreased p.o. Intake.  Follow-up lactic acid level.  2] bilateral lower extremity weakness per family-patient was able to raise his leg up and move all his extremities.  His sensation was intact in both lower extremities.  When his wife reported that he does not have control over his bowel movements which has been present over a month I did think of the possibility of cord compression but patient has good  sensation and movement in both legs.  I will hold his Lasix.  He takes Lasix 20 mg daily.  3] acute on chronic CKD with a creatinine 1.61.  His creatinine was 1.30-1.4 610 days ago while he was admitted to the hospital.  He has already already received a liter of IV fluids in the ER I will give him slow IV hydration.  I am holding his Lasix and metformin recheck labs tomorrow.  4] chronic bilateral pitting edema very minimal.  Patient had echocardiogram during his recent hospital stay with good ejection fraction 60-65%.  5] O2 dependent COPD patient currently has no COPD exacerbation chest x-ray shows bibasilar atelectasis will restart his home dose of nebulizers as well as prednisone.  And Spiriva.  6] history of diabetes restart glipizide hold metformin.  7]History of prostate cancer continue Proscar.  8] hyperlipidemia continue Lipitor.  9] anemia of chronic disease continue iron tablets.  10] anxiety restart Prozac and BuSpar.  11] neuropathy continue Neurontin.  12] generalized weakness patient with 2 admissions this month.  Will obtain PT OT consult patient most likely will need a skilled nursing facility placement for rehab.    DVT prophylaxis: Lovenox Code Status: DO NOT RESUSCITATE patient told me this in front of the family.  He did say that he did not want resuscitation. Family Communication: Discussed with family Disposition Plan: To be determined Consults called: None Admission status: Inpatient   Georgette Shell MD [facility]  If 7PM-7AM, please contact night-coverage [email or web address] Password [facility]  12/29/2017, 4:07 PM

## 2017-12-29 NOTE — Progress Notes (Signed)
Pt gave permission to ask questions on the nursing admission history in front of his son and his wife. Lucius Conn BSN, RN-BC Admissions RN 12/29/2017 3:57 PM

## 2017-12-29 NOTE — Progress Notes (Signed)
A consult was received from an ED physician for [place] per pharmacy dosing.  The patient's profile has been reviewed for ht/wt/allergies/indication/available labs.   A one time order has been placed for Vancomycin 2gm & Zosyn 3.375gm.  Further antibiotics/pharmacy consults should be ordered by admitting physician if indicated.                       Thank you, Biagio Borg 12/29/2017  2:33 PM

## 2017-12-29 NOTE — ED Provider Notes (Signed)
[facility] DEPT Provider Note   CSN: [id number] Arrival date & time: 12/29/17  1131     History   Chief Complaint Chief Complaint  Patient presents with  . Weakness    HPI ALCUS BRADLY is a 77 y.o. male.  HPI   77 year old male with past medical history significant for diabetes, hypertension hyperlipidemia prostate cancer.  Patient is being brought here today by wife for globalized weakness.  Patient had multiple ED visits recetnly t for hypoglycemia.  This weakness sounds mildly progressive given that wife is reports that she is been having people come and help her shower him in such.  Patient does not wish to be in a facility at this time.    Patient has home nursing already.   Past Medical History:  Diagnosis Date  . Cancer ([place])   . Diabetes mellitus   . Hyperlipidemia   . Hypertension   . Prostate cancer [facility])     Patient Active Problem List   Diagnosis Date Noted  . Hypoglycemia 12/17/2017  . Leg weakness 11/21/2015  . Lumbar canal stenosis 11/21/2015  . Open wound of heel 02/15/2015  . History of surgical procedure 11/16/2014  . Chronic obstructive pulmonary disease ([place]) 10/31/2014  . Type 2 diabetes mellitus ([place]) 10/31/2014  . BP (high blood pressure) 10/31/2014  . Acquired cavovarus deformity of foot 04/19/2014  . Acquired equinus deformity of foot 04/19/2014  . Ankle contracture 03/28/2014  . Rhabdomyolysis 07/05/2013  . COPD exacerbation ([place]) 07/05/2013  . H/O prostate cancer 07/04/2013  . Diabetes mellitus ([place]) 07/04/2013  . HTN (hypertension) 07/04/2013  . History of tobacco abuse 07/04/2013  . Fall 07/04/2013  . Thigh pain, musculoskeletal 07/04/2013  . Hypoxia 07/04/2013  . HLD (hyperlipidemia) 07/04/2013  . Depression 07/04/2013  . Chronic kidney disease 07/04/2013    Past Surgical History:  Procedure Laterality Date  . CATARACT EXTRACTION W/ INTRAOCULAR LENS IMPLANT  2007   bilateral  . FOOT  SURGERY     left drop foot repair  . SHOULDER OPEN ROTATOR CUFF REPAIR  2010   left        Home Medications    Prior to Admission medications   Medication Sig Start Date End Date Taking? Authorizing Provider  albuterol (PROVENTIL) (2.5 MG/3ML) 0.083% nebulizer solution Take 3 mLs (2.5 mg total) by nebulization every 6 (six) hours. 12/20/17  Yes Regalado, Belkys A, MD  aspirin 325 MG tablet Take 325 mg by mouth daily.   Yes [provider]  atorvastatin (LIPITOR) 10 MG tablet Take 10 mg by mouth daily.   Yes [provider]  budesonide-formoterol (SYMBICORT) 160-4.5 MCG/ACT inhaler Inhale 2 puffs into the lungs 2 (two) times daily. 07/07/13  Yes Tat, Shanon Brow, MD  busPIRone (BUSPAR) 10 MG tablet Take 10 mg by mouth 2 (two) times daily. 12/21/17  Yes [provider]  cholecalciferol (VITAMIN D) 1000 UNITS tablet Take 1,000 Units by mouth daily.   Yes [provider]  cyanocobalamin 100 MCG tablet Take 1 tablet (100 mcg total) by mouth daily. 12/21/17  Yes Regalado, Belkys A, MD  ferrous sulfate 325 (65 FE) MG tablet Take 1 tablet (325 mg total) by mouth daily with breakfast. Take with Calcium 12/20/17  Yes Regalado, Belkys A, MD  finasteride (PROSCAR) 5 MG tablet Take 5 mg by mouth daily.   Yes [provider]  FLUoxetine (PROZAC) 20 MG capsule Take 20 mg by mouth 3 (three) times daily. 12/28/17  Yes [provider]  furosemide (LASIX) 20 MG tablet Take 20 mg by mouth daily.   Yes [provider]  gabapentin (NEURONTIN) 300 MG capsule Take 300 mg by mouth at bedtime.   Yes [provider]  glipiZIDE (GLUCOTROL) 5 MG tablet Take 0.5 tablets (2.5 mg total) by mouth 2 (two) times daily before a meal. 12/20/17  Yes Regalado, Belkys A, MD  hydroxypropyl methylcellulose (ISOPTO TEARS) 2.5 % ophthalmic solution Place 1 drop into both eyes as needed (for dryness).    Yes [provider]  metFORMIN (GLUCOPHAGE) 1000 MG tablet Take  500 mg by mouth 2 (two) times daily with a meal.   Yes [provider]  Multiple Vitamin (MULTIVITAMIN WITH MINERALS) TABS tablet Take 1 tablet by mouth daily. 12/21/17  Yes Regalado, Belkys A, MD  nicotine (NICODERM CQ - DOSED IN MG/24 HR) 7 mg/24hr patch Place 1 patch (7 mg total) onto the skin daily. 12/21/17  Yes Regalado, Belkys A, MD  OVER THE COUNTER MEDICATION Apply 1 application topically 2 (two) times daily as needed (RASH). DIAPER/RASH CREAM   Yes [provider]  oxyCODONE-acetaminophen (PERCOCET/ROXICET) 5-325 MG per tablet Take one tablet by mouth every 4 hours as needed for severe pain. Caution for altered mental status 09/11/14  Yes Estill Dooms, MD  predniSONE (DELTASONE) 20 MG tablet Take 1 tablet (20 mg total) by mouth daily with breakfast. 12/20/17  Yes Regalado, Belkys A, MD  senna-docusate (SENOKOT-S) 8.6-50 MG tablet Take 1 tablet by mouth daily as needed for mild constipation. 12/20/17  Yes Regalado, Belkys A, MD  tiotropium (SPIRIVA) 18 MCG inhalation capsule Place 18 mcg into inhaler and inhale daily.   Yes [provider]    Family History No family history on file.  Social History Social History   Tobacco Use  . Smoking status: Former Smoker    Last attempt to quit: 04/11/2005    Years since quitting: 12.7  . Smokeless tobacco: Never Used  Substance Use Topics  . Alcohol use: No  . Drug use: No     Allergies   Oxycodone   Review of Systems Review of Systems  Constitutional: Positive for fatigue. Negative for activity change and fever.  HENT: Negative for congestion.   Respiratory: Negative for chest tightness and shortness of breath.   Cardiovascular: Negative for chest pain.  Gastrointestinal: Negative for abdominal pain, nausea and vomiting.  Genitourinary: Negative for difficulty urinating and dysuria.  Neurological: Positive for weakness.  All other systems reviewed and are negative.    Physical Exam Updated Vital  Signs BP (!) 112/56 (BP Location: Right Arm)   Pulse 96   Temp 98.3 F (36.8 C) (Oral)   Resp 16   SpO2 95%   Physical Exam  Constitutional: He is oriented to person, place, and time. He appears well-nourished.  HENT:  Head: Normocephalic.  Eyes: Conjunctivae are normal. Right eye exhibits no discharge. Left eye exhibits no discharge.  Cardiovascular: Normal rate and regular rhythm.  No murmur heard. Pulmonary/Chest: Effort normal and breath sounds normal. No respiratory distress.  Scattered wheezes.  On home O2.  Musculoskeletal:  Right lower extremity larger than left, chronic.  Left with chronic changes from old surgery.  Mild area of erythema on the left anterior shin, patient reports his potentially different than baseline though he is unsure. It is warm to the touch  Neurological: He is oriented to person, place, and time. No cranial nerve deficit.  Skin: Skin is warm and dry. He  is not diaphoretic.  Psychiatric: He has a normal mood and affect. His behavior is normal.     ED Treatments / Results  Labs (all labs ordered are listed, but only abnormal results are displayed) Labs Reviewed  BRAIN NATRIURETIC PEPTIDE - Abnormal; Notable for the following components:      Result Value   B Natriuretic Peptide 101.2 (*)    All other components within normal limits  CBC WITH DIFFERENTIAL/PLATELET - Abnormal; Notable for the following components:   WBC 15.5 (*)    RBC 3.74 (*)    Hemoglobin 9.2 (*)    HCT 30.6 (*)    MCH 24.6 (*)    RDW 16.5 (*)    Neutro Abs 12.9 (*)    Monocytes Absolute 1.1 (*)    All other components within normal limits  COMPREHENSIVE METABOLIC PANEL - Abnormal; Notable for the following components:   Chloride 91 (*)    Glucose, Bld 193 (*)    BUN 21 (*)    Creatinine, Ser 1.61 (*)    Albumin 3.1 (*)    ALT 13 (*)    GFR calc non Af Amer 39 (*)    GFR calc Af Amer 46 (*)    All other components within normal limits  I-STAT CG4 LACTIC ACID, ED -  Abnormal; Notable for the following components:   Lactic Acid, Venous 3.58 (*)    All other components within normal limits  CULTURE, BLOOD (ROUTINE X 2)  CULTURE, BLOOD (ROUTINE X 2)  URINALYSIS, ROUTINE W REFLEX MICROSCOPIC  I-STAT TROPONIN, ED    EKG EKG Interpretation  Date/Time:  Wednesday December 29 2017 12:03:15 EDT Ventricular Rate:  96 PR Interval:    QRS Duration: 104 QT Interval:  379 QTC Calculation: 479 R Axis:   40 Text Interpretation:  Sinus rhythm Normal sinus rhythm Confirmed by Thomasene Lot [place] [phone]) on 12/29/2017 3:11:37 PM   Radiology Dg Chest 2 View  Result Date: 12/29/2017 CLINICAL DATA:  Chest pain. EXAM: CHEST - 2 VIEW COMPARISON:  Radiographs of December 17, 2017. FINDINGS: Stable cardiomediastinal silhouette. No pneumothorax is noted. Mild bibasilar subsegmental atelectasis is noted. No significant pleural effusion is noted. Bony thorax is unremarkable. IMPRESSION: Mild bibasilar subsegmental atelectasis. Electronically Signed   By: Marijo Conception, M.D.   On: 12/29/2017 14:09    Procedures Procedures (including critical care time)  CRITICAL CARE Performed by: Gardiner Sleeper Total critical care time: 45 minutes Critical care time was exclusive of separately billable procedures and treating other patients. Critical care was necessary to treat or prevent imminent or life-threatening deterioration. Critical care was time spent personally by me on the following activities: development of treatment plan with patient and/or surrogate as well as nursing, discussions with consultants, evaluation of patient's response to treatment, examination of patient, obtaining history from patient or surrogate, ordering and performing treatments and interventions, ordering and review of laboratory studies, ordering and review of radiographic studies, pulse oximetry and re-evaluation of patient's condition.   Medications Ordered in ED Medications  sodium chloride 0.9 %  bolus 1,000 mL (1,000 mLs Intravenous New Bag/Given 12/29/17 1438)  piperacillin-tazobactam (ZOSYN) IVPB 3.375 g (3.375 g Intravenous New Bag/Given 12/29/17 1458)  vancomycin (VANCOCIN) 2,000 mg in sodium chloride 0.9 % 500 mL IVPB (has no administration in time range)     Initial Impression / Assessment and Plan / ED Course  I have reviewed the triage vital signs and the nursing notes.  Pertinent labs & imaging results that  were available during my care of the patient were reviewed by me and considered in my medical decision making (see chart for details).     77 year old male with past medical history significant for diabetes, hypertension hyperlipidemia prostate cancer.  Patient is being brought here today by wife for globalized weakness.  Patient had multiple ED visits recetnly t for hypoglycemia.  This weakness sounds mildly progressive given that wife is reports that she is been having people come and help her shower him in such.  Patient does not wish to be in a facility at this time.    Patient has home nursing already.  1:43 PM Will look for signs of infection to explain patient's progressive weakness.  Though I suspect that it is mulitfactorial and likely due to deconditioning and age.    3:15 PM Patient's lactate came back elevated above 3.  White count is 15.  This is evidence for infection, though I am unsure where.  Patient's chest x-ray shows mild atelectasis.  Awaiting patient's urine.  Patient does have evidence of questionable cellulitis in left lower extremity.  Broad-spectrum antibiotics initiated.  Patient given a liter of fluid.  Will admit. Final Clinical Impressions(s) / ED Diagnoses   Final diagnoses:  None    ED Discharge Orders    None       Starkeisha Vanwinkle, Fredia Sorrow, MD 12/29/17 1515

## 2017-12-29 NOTE — ED Triage Notes (Signed)
Per EMS, pt from home. Wife states pt seemed weak yesterday. Wife states the pt was unable to stand up by himself or walk today. Pt could barely stand up with EMS.   BP 130/80 HR 101 O2 96% on 4L CBG 247

## 2017-12-29 NOTE — ED Notes (Signed)
Bed: WA20 Expected date:  Expected time:  Means of arrival:  Comments: Hold room 70 77 yo weakness

## 2017-12-29 NOTE — ED Notes (Signed)
ED TO INPATIENT HANDOFF REPORT  Name/Age/Gender [name] [name] 77 y.o. male  Code Status    Code Status Orders  (From admission, onward)        Start     Ordered   12/29/17 1636  Do not attempt resuscitation (DNR)  Continuous    Question Answer Comment  In the event of cardiac or respiratory ARREST Do not call a "code blue"   In the event of cardiac or respiratory ARREST Do not perform Intubation, CPR, defibrillation or ACLS   In the event of cardiac or respiratory ARREST Use medication by any route, position, wound care, and other measures to relive pain and suffering. May use oxygen, suction and manual treatment of airway obstruction as needed for comfort.      12/29/17 1635    Code Status History    Date Active Date Inactive Code Status Order ID Comments User Context   12/17/2017 0806 12/20/2017 1709 Full Code [id number]  Elease Hashimoto ED   07/04/2013 2232 07/07/2013 1328 Full Code [id number]  Barton Dubois, MD Inpatient      Home/SNF/Other Home  Chief Complaint Weakness  Level of Care/Admitting Diagnosis ED Disposition    ED Disposition Condition [facility]: [facility] [[id number]]  Level of Care: Med-Surg [16]  Diagnosis: Cellulitis [[id number]]  Admitting Physician: Georgette Shell [[id number]]  Attending Physician: Georgette Shell [[id number]]  Estimated length of stay: 3 - 4 days  Certification:: I certify this patient will need inpatient services for at least 2 midnights  PT Class (Do Not Modify): Inpatient [101]  PT Acc Code (Do Not Modify): Private [1]       Medical History Past Medical History:  Diagnosis Date  . Cancer ([place])   . Diabetes mellitus   . Hyperlipidemia   . Hypertension   . Prostate cancer ([facility])     Allergies Allergies  Allergen Reactions  . Oxycodone Itching and Other (See Comments)    Only intolerant when takes with Diphenhydramine, altered mental status    IV  Location/Drains/Wounds Patient Lines/Drains/Airways Status   Active Line/Drains/Airways    Name:   Placement date:   Placement time:   Site:   Days:   Peripheral IV 12/29/17 Left Hand   12/29/17    1329    Hand   less than 1   Peripheral IV 12/29/17 Right Antecubital   12/29/17    1602    Antecubital   less than 1   Wound / Incision (Open or Dehisced) 12/18/17 Other (Comment) Buttocks Right;Left Discoloration on BIL buttocks   12/18/17    0830    Buttocks   11          Labs/Imaging Results for orders placed or performed during the hospital encounter of 12/29/17 (from the past 48 hour(s))  Brain natriuretic peptide     Status: Abnormal   Collection Time: 12/29/17  1:31 PM  Result Value Ref Range   B Natriuretic Peptide 101.2 (H) 0.0 - 100.0 pg/mL    Comment: Performed at [facility], [place] [address], [place], [place] [address]  CBC with Differential/Platelet     Status: Abnormal   Collection Time: 12/29/17  1:32 PM  Result Value Ref Range   WBC 15.5 (H) 4.0 - 10.5 K/uL   RBC 3.74 (L) 4.22 - 5.81 MIL/uL   Hemoglobin 9.2 (L) 13.0 - 17.0 g/dL   HCT 30.6 (L) 39.0 - 52.0 %  MCV 81.8 78.0 - 100.0 fL   MCH 24.6 (L) 26.0 - 34.0 pg   MCHC 30.1 30.0 - 36.0 g/dL   RDW 16.5 (H) 11.5 - 15.5 %   Platelets 335 150 - 400 K/uL   Neutrophils Relative % 83 %   Neutro Abs 12.9 (H) 1.7 - 7.7 K/uL   Lymphocytes Relative 9 %   Lymphs Abs 1.3 0.7 - 4.0 K/uL   Monocytes Relative 7 %   Monocytes Absolute 1.1 (H) 0.1 - 1.0 K/uL   Eosinophils Relative 1 %   Eosinophils Absolute 0.2 0.0 - 0.7 K/uL   Basophils Relative 0 %   Basophils Absolute 0.0 0.0 - 0.1 K/uL    Comment: Performed at [facility], [place] [address] [place], [place] [address]  Comprehensive metabolic panel     Status: Abnormal   Collection Time: 12/29/17  1:32 PM  Result Value Ref Range   Sodium 135 135 - 145 mmol/L   Potassium 4.4 3.5 - 5.1 mmol/L   Chloride 91 (L) 101 - 111 mmol/L   CO2 29 22  - 32 mmol/L   Glucose, Bld 193 (H) 65 - 99 mg/dL   BUN 21 (H) 6 - 20 mg/dL   Creatinine, Ser 1.61 (H) 0.61 - 1.24 mg/dL   Calcium 9.5 8.9 - 10.3 mg/dL   Total Protein 7.4 6.5 - 8.1 g/dL   Albumin 3.1 (L) 3.5 - 5.0 g/dL   AST 22 15 - 41 U/L   ALT 13 (L) 17 - 63 U/L   Alkaline Phosphatase 82 38 - 126 U/L   Total Bilirubin 0.7 0.3 - 1.2 mg/dL   GFR calc non Af Amer 39 (L) >60 mL/min   GFR calc Af Amer 46 (L) >60 mL/min    Comment: (NOTE) The eGFR has been calculated using the CKD EPI equation. This calculation has not been validated in all clinical situations. eGFR's persistently <60 mL/min signify possible Chronic Kidney Disease.    Anion gap 15 5 - 15    Comment: Performed at [facility], [place] [address], [place], [place] [address]  I-stat troponin, ED     Status: None   Collection Time: 12/29/17  1:36 PM  Result Value Ref Range   Troponin i, poc 0.00 0.00 - 0.08 ng/mL   Comment 3            Comment: Due to the release kinetics of cTnI, a negative result within the first hours of the onset of symptoms does not rule out myocardial infarction with certainty. If myocardial infarction is still suspected, repeat the test at appropriate intervals.   I-Stat CG4 Lactic Acid, ED     Status: Abnormal   Collection Time: 12/29/17  1:38 PM  Result Value Ref Range   Lactic Acid, Venous 3.58 (HH) 0.5 - 1.9 mmol/L   Comment NOTIFIED PHYSICIAN   I-Stat CG4 Lactic Acid, ED     Status: None   Collection Time: 12/29/17  4:09 PM  Result Value Ref Range   Lactic Acid, Venous 1.05 0.5 - 1.9 mmol/L  Lactic acid, plasma     Status: None   Collection Time: 12/29/17  6:29 PM  Result Value Ref Range   Lactic Acid, Venous 1.4 0.5 - 1.9 mmol/L    Comment: Performed at [facility], [place] [address], [place], [place] [address]   Dg Chest 2 View  Result Date: 12/29/2017 CLINICAL DATA:  Chest pain. EXAM: CHEST - 2 VIEW COMPARISON:  Radiographs of December 17, 2017.  FINDINGS: Stable cardiomediastinal silhouette. No pneumothorax is noted. Mild bibasilar subsegmental atelectasis is noted. No significant pleural effusion is noted. Bony thorax is unremarkable. IMPRESSION: Mild bibasilar subsegmental atelectasis. Electronically Signed   By: Marijo Conception, M.D.   On: 12/29/2017 14:09    Pending Labs Unresulted Labs (From admission, onward)   Start     Ordered   12/30/17 9935  Basic metabolic panel  Tomorrow morning,   R     12/29/17 1636   12/29/17 1429  Blood Culture (routine x 2)  BLOOD CULTURE X 2,   STAT     12/29/17 1428   12/29/17 1257  Urinalysis, Routine w reflex microscopic  Once,   R     12/29/17 1257      Vitals/Pain Today's Vitals   12/29/17 1529 12/29/17 1600 12/29/17 1608 12/29/17 1834  BP: (!) 126/111 125/69  139/61  Pulse: 85 79  86  Resp: (!) 21 15  17   Temp:      TempSrc:      SpO2: 100%   97%  PainSc:   0-No pain     Isolation Precautions No active isolations  Medications Medications  albuterol (PROVENTIL) (2.5 MG/3ML) 0.083% nebulizer solution 2.5 mg (2.5 mg Nebulization Given 12/29/17 1936)  aspirin tablet 325 mg (has no administration in time range)  atorvastatin (LIPITOR) tablet 10 mg (10 mg Oral Given 12/29/17 1828)  fluticasone furoate-vilanterol (BREO ELLIPTA) 200-25 MCG/INH 1 puff (has no administration in time range)  busPIRone (BUSPAR) tablet 10 mg (has no administration in time range)  cholecalciferol (VITAMIN D) tablet 1,000 Units (has no administration in time range)  vitamin B-12 (CYANOCOBALAMIN) tablet 100 mcg (has no administration in time range)  ferrous sulfate tablet 325 mg (has no administration in time range)  finasteride (PROSCAR) tablet 5 mg (has no administration in time range)  FLUoxetine (PROZAC) capsule 20 mg (has no administration in time range)  gabapentin (NEURONTIN) capsule 300 mg (has no administration in time range)  glipiZIDE (GLUCOTROL) tablet 2.5 mg (has no administration in time range)   hydroxypropyl methylcellulose / hypromellose (ISOPTO TEARS / GONIOVISC) 2.5 % ophthalmic solution 1 drop (has no administration in time range)  multivitamin with minerals tablet 1 tablet (has no administration in time range)  nicotine (NICODERM CQ - dosed in mg/24 hr) patch 7 mg (has no administration in time range)  oxyCODONE-acetaminophen (PERCOCET/ROXICET) 5-325 MG per tablet 1 tablet (has no administration in time range)  predniSONE (DELTASONE) tablet 20 mg (has no administration in time range)  senna-docusate (Senokot-S) tablet 1 tablet (has no administration in time range)  tiotropium (SPIRIVA) inhalation capsule 18 mcg (has no administration in time range)  cefTRIAXone (ROCEPHIN) 1 g in sodium chloride 0.9 % 100 mL IVPB (has no administration in time range)  sodium chloride 0.9 % bolus 1,000 mL (0 mLs Intravenous Stopped 12/29/17 1600)  piperacillin-tazobactam (ZOSYN) IVPB 3.375 g (0 g Intravenous Stopped 12/29/17 1528)  vancomycin (VANCOCIN) 2,000 mg in sodium chloride 0.9 % 500 mL IVPB (0 mg Intravenous Stopped 12/29/17 1728)  0.9 %  sodium chloride infusion ( Intravenous New Bag/Given 12/29/17 1640)    Mobility walks with person assist

## 2017-12-30 DIAGNOSIS — L03116 Cellulitis of left lower limb: Principal | ICD-10-CM

## 2017-12-30 DIAGNOSIS — N189 Chronic kidney disease, unspecified: Secondary | ICD-10-CM

## 2017-12-30 DIAGNOSIS — N179 Acute kidney failure, unspecified: Secondary | ICD-10-CM

## 2017-12-30 LAB — GLUCOSE, CAPILLARY
GLUCOSE-CAPILLARY: 132 mg/dL — AB (ref 65–99)
Glucose-Capillary: 113 mg/dL — ABNORMAL HIGH (ref 65–99)
Glucose-Capillary: 140 mg/dL — ABNORMAL HIGH (ref 65–99)
Glucose-Capillary: 174 mg/dL — ABNORMAL HIGH (ref 65–99)
Glucose-Capillary: 219 mg/dL — ABNORMAL HIGH (ref 65–99)
Glucose-Capillary: 239 mg/dL — ABNORMAL HIGH (ref 65–99)

## 2017-12-30 LAB — BASIC METABOLIC PANEL
Anion gap: 9 (ref 5–15)
BUN: 17 mg/dL (ref 6–20)
CHLORIDE: 100 mmol/L — AB (ref 101–111)
CO2: 29 mmol/L (ref 22–32)
Calcium: 8.5 mg/dL — ABNORMAL LOW (ref 8.9–10.3)
Creatinine, Ser: 1.4 mg/dL — ABNORMAL HIGH (ref 0.61–1.24)
GFR calc Af Amer: 54 mL/min — ABNORMAL LOW (ref >60)
GFR, EST NON AFRICAN AMERICAN: 47 mL/min — AB (ref >60)
GLUCOSE: 133 mg/dL — AB (ref 65–99)
POTASSIUM: 3.9 mmol/L (ref 3.5–5.1)
Sodium: 138 mmol/L (ref 135–145)

## 2017-12-30 LAB — URINALYSIS, ROUTINE W REFLEX MICROSCOPIC
BILIRUBIN URINE: NEGATIVE
Glucose, UA: NEGATIVE mg/dL
HGB URINE DIPSTICK: NEGATIVE
Ketones, ur: NEGATIVE mg/dL
Leukocytes, UA: NEGATIVE
Nitrite: NEGATIVE
Protein, ur: NEGATIVE mg/dL
Specific Gravity, Urine: 1.011 (ref 1.005–1.030)
pH: 7 (ref 5.0–8.0)

## 2017-12-30 MED ORDER — INSULIN ASPART 100 UNIT/ML ~~LOC~~ SOLN
0.0000 [IU] | Freq: Three times a day (TID) | SUBCUTANEOUS | Status: DC
  Administered 2017-12-30: 3 [IU] via SUBCUTANEOUS
  Administered 2017-12-30 – 2017-12-31 (×2): 1 [IU] via SUBCUTANEOUS
  Administered 2017-12-31: 2 [IU] via SUBCUTANEOUS
  Administered 2017-12-31: 5 [IU] via SUBCUTANEOUS
  Administered 2018-01-01: 1 [IU] via SUBCUTANEOUS
  Administered 2018-01-01: 3 [IU] via SUBCUTANEOUS
  Administered 2018-01-01: 7 [IU] via SUBCUTANEOUS
  Administered 2018-01-02: 5 [IU] via SUBCUTANEOUS
  Administered 2018-01-03: 1 [IU] via SUBCUTANEOUS
  Administered 2018-01-03: 2 [IU] via SUBCUTANEOUS

## 2017-12-30 MED ORDER — MUPIROCIN CALCIUM 2 % EX CREA
TOPICAL_CREAM | Freq: Every day | CUTANEOUS | Status: DC
Start: 2017-12-30 — End: 2018-01-03
  Administered 2017-12-30 – 2018-01-03 (×5): via TOPICAL
  Filled 2017-12-30: qty 15

## 2017-12-30 MED ORDER — FLUTICASONE FUROATE-VILANTEROL 200-25 MCG/INH IN AEPB
1.0000 | INHALATION_SPRAY | Freq: Every day | RESPIRATORY_TRACT | Status: DC
  Administered 2017-12-30 – 2018-01-03 (×4): 1 via RESPIRATORY_TRACT
  Filled 2017-12-30 (×2): qty 28

## 2017-12-30 MED ORDER — ALBUTEROL SULFATE (2.5 MG/3ML) 0.083% IN NEBU
2.5000 mg | INHALATION_SOLUTION | Freq: Three times a day (TID) | RESPIRATORY_TRACT | Status: DC
  Administered 2017-12-31 – 2018-01-03 (×10): 2.5 mg via RESPIRATORY_TRACT
  Filled 2017-12-30 (×10): qty 3

## 2017-12-30 MED ORDER — MUPIROCIN CALCIUM 2 % EX CREA
TOPICAL_CREAM | Freq: Two times a day (BID) | CUTANEOUS | Status: DC
  Filled 2017-12-30: qty 15

## 2017-12-30 MED ORDER — ALBUTEROL SULFATE (2.5 MG/3ML) 0.083% IN NEBU: 2.5000 mg | INHALATION_SOLUTION | RESPIRATORY_TRACT | Status: DC | PRN

## 2017-12-30 NOTE — Evaluation (Signed)
Physical Therapy Evaluation Patient Details Name: [name] [name] MRN: [id number] DOB: November 06, 1940 Today's Date: 12/30/2017   History of Present Illness  77 yo male admitted with L LE cellulitis. Hx of prostate ca, anemia, neuropathy, COPD-O2, dm, CKD.   Clinical Impression  On eval, pt required Mod assist +2 for assistance, +2 for safety/equipment for mobility. He was able to stand x 2 and take a few steps along the side of the bed with a RW. Pt remained on 4L [place] O2-sats 96%. Pt presents with general weakness, decreased activity tolerance, and impaired gait and balance. He is currently at risk for falls. Recommend ST rehab at SNF to regain strength and improve functional mobility and safety. Will follow during hospital stay.     Follow Up Recommendations SNF    Equipment Recommendations  (TBD at next venue)    Recommendations for Other Services       Precautions / Restrictions Precautions Precautions: Fall Precaution Comments: O2 dep Restrictions Weight Bearing Restrictions: No      Mobility  Bed Mobility Overal bed mobility: Needs Assistance Bed Mobility: Supine to Sit;Sit to Supine     Supine to sit: Mod assist;HOB elevated Sit to supine: Mod assist   General bed mobility comments: Assist for trunk and LEs. Increased time. Cues for pt to put forth more effort.   Transfers Overall transfer level: Needs assistance Equipment used: Rolling walker (2 wheeled) Transfers: Sit to/from Stand Sit to Stand: Mod assist;+2 physical assistance;+2 safety/equipment;From elevated surface         General transfer comment: x2. Assist to rise, stabilize, control descent. VCS safety, hand placement. Pt preferred to pull up on walker with 1 hand on front bar.   Ambulation/Gait Ambulation/Gait assistance: Min assist;+2 physical assistance;+2 safety/equipment   Assistive device: Rolling walker (2 wheeled)       General Gait Details: side steps along side of bed with RW x 5. Assist to  stabilize pt and maneuver with walker.   Stairs            Wheelchair Mobility    Modified Rankin (Stroke Patients Only)       Balance Overall balance assessment: Needs assistance         Standing balance support: Bilateral upper extremity supported Standing balance-Leahy Scale: Poor                               Pertinent Vitals/Pain Pain Assessment: No/denies pain    Home Living Family/patient expects to be discharged to:: Private residence Living Arrangements: Spouse/significant other Available Help at Discharge: Friend(s);Family;Personal care attendant Type of Home: House Home Access: Level entry     Home Layout: One level Home Equipment: Walker - 4 wheels;Shower seat - built in;Grab bars - tub/shower;Bedside commode Additional Comments: uses BSC next to bed at night; has lift chair    Prior Function Level of Independence: Needs assistance   Gait / Transfers Assistance Needed: Uses rollator for ambulation with multiple breaks due to fatigue.   ADL's / Homemaking Assistance Needed: Wife assists with LB dressing at times. Able to complete showers without assistance. Wife does most of housework. Will go to store but only with wife present.    Comments: Not very active at home per pt and family     Hand Dominance   Dominant Hand: Right    Extremity/Trunk Assessment   Upper Extremity Assessment Upper Extremity Assessment: Defer to OT evaluation  Lower Extremity Assessment Lower Extremity Assessment: Generalized weakness    Cervical / Trunk Assessment Cervical / Trunk Assessment: Normal  Communication   Communication: No difficulties  Cognition Arousal/Alertness: Awake/alert Behavior During Therapy: WFL for tasks assessed/performed Overall Cognitive Status: Difficult to assess                                 General Comments: pt somewhat drowsy during session      General Comments      Exercises      Assessment/Plan    PT Assessment Patient needs continued PT services  PT Problem List Decreased strength;Decreased range of motion;Decreased activity tolerance;Decreased balance;Decreased mobility;Decreased knowledge of use of DME;Decreased safety awareness;Decreased knowledge of precautions       PT Treatment Interventions DME instruction;Gait training;Stair training;Functional mobility training;Therapeutic activities;Therapeutic exercise;Balance training;Neuromuscular re-education;Patient/family education    PT Goals (Current goals can be found in the Care Plan section)  Acute Rehab PT Goals Patient Stated Goal: none stated PT Goal Formulation: With patient Time For Goal Achievement: 01/13/18 Potential to Achieve Goals: Good    Frequency Min 3X/week   Barriers to discharge Decreased caregiver support      Co-evaluation               AM-PAC PT "6 Clicks" Daily Activity  Outcome Measure     Difficulty sitting down on and standing up from a chair with arms (e.g., wheelchair, bedside commode, etc,.)?: Unable Help needed moving to and from a bed to chair (including a wheelchair)?: A Lot Help needed walking in hospital room?: A Lot Help needed climbing 3-5 steps with a railing? : A Lot 6 Click Score: 7    End of Session Equipment Utilized During Treatment: Gait belt Activity Tolerance: Patient limited by fatigue Patient left: in bed;with call bell/phone within reach;with bed alarm set   PT Visit Diagnosis: Muscle weakness (generalized) (M62.81);Difficulty in walking, not elsewhere classified (R26.2);Unsteadiness on feet (R26.81);Other abnormalities of gait and mobility (R26.89);History of falling (Z91.81)    Time: 5361-4431 PT Time Calculation (min) (ACUTE ONLY): 25 min   Charges:   PT Evaluation $PT Eval Moderate Complexity: 1 Mod     PT G Codes:          Weston Anna, MPT Pager: [phone]

## 2017-12-30 NOTE — Progress Notes (Signed)
PROGRESS NOTE    [name] [name]  QQP:[id number] DOB: 01/25/1941 DOA: 12/29/2017 PCP: Prince Solian, MD   Brief Narrative:  Patient is a 77 year old male with history of prostate cancer, diabetes, neuropathy, oxygen dependent COPD, and deficiency anemia, debility who was admitted with cellulitis of the left lower extremity, worsening weakness, debility, acute on chronic renal failure.  Assessment & Plan:   Active Problems:   Cellulitis   1] cellulitis of the left lower extremity: - He got 1 dose of IV vancomycin in the emergency room.-  - Currently improving with ceftriaxone. -Monitor WBC count while on antibiotics.  -lactic acid level improving.  2] bilateral lower extremity weakness per family-patient was able to raise his leg up and move all his extremities. -He has diabetic neuropathy. -There was also mention of him not having control over his bowel movements he has intact strength without any saddle anesthesia.  Unlikely cord compression.  - sensation and movement present in both legs.   -PT/OT consulted.  3] acute on chronic CKD with a creatinine 1.61.   - Gentle IV hydration. -Holding Lasix for now. -Continue to monitor BUN/creatinine.   4] chronic bilateral pitting edema very minimal.  Patient had echocardiogram during his recent hospital stay with good ejection fraction 60-65%. - Improved.  5] O2 dependent COPD patient currently has no COPD exacerbation -  chest x-ray shows bibasilar atelectasis - Continue home inhalers, nebulizers as well as prednisone.   6] history of diabetes  - restarted glipizide; hold metformin. -Monitor Accu-Cheks, insulin sliding scale.  7]History of prostate cancer continue Proscar.  8] hyperlipidemia continue Lipitor.  9] anemia of chronic disease continue iron tablets.  10] anxiety: Stable on Prozac and BuSpar (home meds).  11] neuropathy continue Neurontin.  12] generalized weakness patient with 2 admissions this  month.   - PT/OT consult requested. -Family agreeable for SNF if needed.   DVT prophylaxis: Lovenox Code Status: DO NOT RESUSCITATE confirmed with patient Family Communication: Discussed with family Disposition Plan: To be determined Consults called: None   Subjective: Lower extremity cellulitis improving.  Afebrile.  Complaining of generalized weakness.  Objective: Vitals:   12/30/17 0203 12/30/17 0608 12/30/17 0734 12/30/17 0951  BP:  (!) 136/44  131/62  Pulse:  93  96  Resp:  16  16  Temp:  98.7 F (37.1 C)  98.2 F (36.8 C)  TempSrc:  Oral  Oral  SpO2: 97% 97% 97% 97%  Weight:      Height:        Intake/Output Summary (Last 24 hours) at 12/30/2017 1101 Last data filed at 12/30/2017 0825 Gross per 24 hour  Intake 1700 ml  Output 951 ml  Net 749 ml   Filed Weights   12/29/17 2046  Weight: 91.4 kg (201 lb 8 oz)    Examination:  General exam: Appears calm and comfortable  Respiratory system: Clear to auscultation. Respiratory effort normal. Cardiovascular system: S1 & S2 heard, RRR. No JVD, murmurs, rubs, gallops or clicks. No pedal edema. Gastrointestinal system: Abdomen is nondistended, soft and nontender. No organomegaly or masses felt. Normal bowel sounds heard. Central nervous system: Alert and oriented.  No complaint of generalized weakness, no focal neurological deficits. Gait not assessed Extremities: Erythema Left shin improving.  Edema improving, dry skin, no open lesions Skin: No rashes, lesions or ulcers Psychiatry: Judgement and insight appear normal. Mood & affect appropriate.    Data Reviewed: I have personally reviewed following labs and imaging studies  CBC: Recent Labs  Lab 12/29/17 1332  WBC 15.5*  NEUTROABS 12.9*  HGB 9.2*  HCT 30.6*  MCV 81.8  PLT 850   Basic Metabolic Panel: Recent Labs  Lab 12/29/17 1332 12/30/17 0551  NA 135 138  K 4.4 3.9  CL 91* 100*  CO2 29 29  GLUCOSE 193* 133*  BUN 21* 17  CREATININE 1.61*  1.40*  CALCIUM 9.5 8.5*   GFR: Estimated Creatinine Clearance: 47.7 mL/min (A) (by C-G formula based on SCr of 1.4 mg/dL (H)). Liver Function Tests: Recent Labs  Lab 12/29/17 1332  AST 22  ALT 13*  ALKPHOS 82  BILITOT 0.7  PROT 7.4  ALBUMIN 3.1*   No results for input(s): LIPASE, AMYLASE in the last 168 hours. No results for input(s): AMMONIA in the last 168 hours. Coagulation Profile: No results for input(s): INR, PROTIME in the last 168 hours. Cardiac Enzymes: No results for input(s): CKTOTAL, CKMB, CKMBINDEX, TROPONINI in the last 168 hours. BNP (last 3 results) No results for input(s): PROBNP in the last 8760 hours. HbA1C: No results for input(s): HGBA1C in the last 72 hours. CBG: Recent Labs  Lab 12/29/17 2356 12/30/17 0323 12/30/17 0726  GLUCAP 113* 140* 132*   Lipid Profile: No results for input(s): CHOL, HDL, LDLCALC, TRIG, CHOLHDL, LDLDIRECT in the last 72 hours. Thyroid Function Tests: No results for input(s): TSH, T4TOTAL, FREET4, T3FREE, THYROIDAB in the last 72 hours. Anemia Panel: No results for input(s): VITAMINB12, FOLATE, FERRITIN, TIBC, IRON, RETICCTPCT in the last 72 hours. Sepsis Labs: Recent Labs  Lab 12/29/17 1338 12/29/17 1609 12/29/17 1829  LATICACIDVEN 3.58* 1.05 1.4    No results found for this or any previous visit (from the past 240 hour(s)).       Radiology Studies: Dg Chest 2 View  Result Date: 12/29/2017 CLINICAL DATA:  Chest pain. EXAM: CHEST - 2 VIEW COMPARISON:  Radiographs of December 17, 2017. FINDINGS: Stable cardiomediastinal silhouette. No pneumothorax is noted. Mild bibasilar subsegmental atelectasis is noted. No significant pleural effusion is noted. Bony thorax is unremarkable. IMPRESSION: Mild bibasilar subsegmental atelectasis. Electronically Signed   By: Marijo Conception, M.D.   On: 12/29/2017 14:09        Scheduled Meds: . albuterol  2.5 mg Nebulization Q6H  . aspirin  325 mg Oral Daily  . atorvastatin  10  mg Oral Daily  . busPIRone  10 mg Oral BID  . cholecalciferol  1,000 Units Oral Daily  . ferrous sulfate  325 mg Oral Q breakfast  . finasteride  5 mg Oral Daily  . FLUoxetine  60 mg Oral Daily  . fluticasone furoate-vilanterol  1 puff Inhalation Daily  . gabapentin  300 mg Oral QHS  . glipiZIDE  2.5 mg Oral BID AC  . insulin aspart  0-15 Units Subcutaneous Q4H  . multivitamin with minerals  1 tablet Oral Daily  . nicotine  7 mg Transdermal Daily  . predniSONE  20 mg Oral Q breakfast  . tiotropium  18 mcg Inhalation Daily  . vitamin B-12  100 mcg Oral Daily   Continuous Infusions: . cefTRIAXone (ROCEPHIN)  IV Stopped (12/29/17 2030)     LOS: 1 day    Dannetta Lekas, MD [facility] Pager [phone]  If 7PM-7AM, please contact night-coverage [email or web address] Password [email or web address] 12/30/2017, 11:01 AM

## 2017-12-30 NOTE — Progress Notes (Signed)
Chaplain provided support around decisions for end of life.  Provided education around advance directive.  [name] [name] completed [place] and Living Will.   Chaplain notarized.  Original and copies with patient.  Copy in patient Chart.    In discussion of Living Will / Life prolonging measures, it seemed [name] [name] needed some clarification on DNR / decisions around CPR.    [name] [name] stated that he did not wish to have CPR if he was not able to recover - which his health care power of attorneys heard and affirmed.  However, he noted if he was able to return to the state of health where he is currently, he would want to be resuscitated.  Reported this to nursing, who will relay that [name] [name] may need further conversation around code status.  This decision may be influenced by [name] [name] having family around him during our conversation.    [facility] Chaplain [name] [name], MDiv, [facility]

## 2017-12-30 NOTE — Evaluation (Signed)
Occupational Therapy Evaluation Patient Details Name: [name] [name] MRN: [id number] DOB: 12-03-1940 Today's Date: 12/30/2017    History of Present Illness 77 yo male admitted with L LE cellulitis. Hx of prostate ca, anemia, neuropathy, COPD-O2, dm, CKD.    Clinical Impression   Pt was admitted for the above.  He was at home prior to admission, up with RW and wife assisted as needed with adls. Pt was sleepy and slow to respond. He needs +2 for safety for LB adls (up to total A) and mobility (mod). Goals in acute are for min to mod +1 assist.  Will benefit from OT in acute setting as well as follow up OT after d/c    Follow Up Recommendations  SNF    Equipment Recommendations  None recommended by OT    Recommendations for Other Services       Precautions / Restrictions Precautions Precautions: Fall Precaution Comments: O2 dep Restrictions Weight Bearing Restrictions: No      Mobility Bed Mobility Overal bed mobility: Needs Assistance Bed Mobility: Supine to Sit;Sit to Supine     Supine to sit: Mod assist;HOB elevated Sit to supine: Mod assist   General bed mobility comments: Assist for trunk and LEs. Increased time. Cues for pt to put forth more effort.   Transfers Overall transfer level: Needs assistance Equipment used: Rolling walker (2 wheeled) Transfers: Sit to/from Stand Sit to Stand: Mod assist;+2 physical assistance;+2 safety/equipment;From elevated surface         General transfer comment: x2. Assist to rise, stabilize, control descent. VCS safety, hand placement. Pt preferred to pull up on walker with 1 hand on front bar.     Balance Overall balance assessment: Needs assistance         Standing balance support: Bilateral upper extremity supported Standing balance-Leahy Scale: Poor                             ADL either performed or assessed with clinical judgement   ADL   Eating/Feeding: Set up;Sitting   Grooming: Set up;Sitting    Upper Body Bathing: Moderate assistance;Sitting   Lower Body Bathing: Maximal assistance;+2 for physical assistance;Sit to/from stand   Upper Body Dressing : Moderate assistance;Sitting   Lower Body Dressing: Total assistance;+2 for physical assistance;Sit to/from stand       Toileting- Water quality scientist and Hygiene: Total assistance;+2 for physical assistance;Sit to/from stand         General ADL Comments: pt sleepy this session.  Needed multiple cues to initiate and perform tasks.  Only sidestepped up [place].  Pt had had bowel movement; assisted with hygiene     Vision         Perception     Praxis      Pertinent Vitals/Pain Pain Assessment: No/denies pain     Hand Dominance Right   Extremity/Trunk Assessment Upper Extremity Assessment Upper Extremity Assessment: Generalized weakness   Lower Extremity Assessment Lower Extremity Assessment: Generalized weakness   Cervical / Trunk Assessment Cervical / Trunk Assessment: Normal   Communication Communication Communication: No difficulties   Cognition Arousal/Alertness: Awake/alert Behavior During Therapy: WFL for tasks assessed/performed Overall Cognitive Status: Difficult to assess                                 General Comments: pt somewhat drowsy during session.  Slow to initiate   General Comments  Exercises     Shoulder Instructions      Home Living Family/patient expects to be discharged to:: Private residence Living Arrangements: Spouse/significant other Available Help at Discharge: Friend(s);Family;Personal care attendant Type of Home: House Home Access: Level entry     Home Layout: One level     Bathroom Shower/Tub: Walk-in shower;Tub/shower unit   Bathroom Toilet: Standard     Home Equipment: Environmental consultant - 4 wheels;Shower seat - built in;Grab bars - tub/shower;Bedside commode   Additional Comments: uses BSC next to bed at night; has lift chair      Prior  Functioning/Environment Level of Independence: Needs assistance  Gait / Transfers Assistance Needed: Uses rollator for ambulation with multiple breaks due to fatigue.  ADL's / Homemaking Assistance Needed: Wife assists with LB dressing at times. Able to complete showers without assistance. Wife does most of housework. Will go to store but only with wife present.     Comments: Not very active at home per pt and family        OT Problem List: Decreased strength;Decreased activity tolerance;Impaired balance (sitting and/or standing);Decreased safety awareness;Decreased knowledge of use of DME or AE;Decreased knowledge of precautions;Cardiopulmonary status limiting activity;Decreased cognition      OT Treatment/Interventions: Self-care/ADL training;Therapeutic exercise;Energy conservation;DME and/or AE instruction;Therapeutic activities;Patient/family education;Balance training    OT Goals(Current goals can be found in the care plan section) Acute Rehab OT Goals Patient Stated Goal: none stated OT Goal Formulation: With patient/family Time For Goal Achievement: 01/13/18 Potential to Achieve Goals: Good ADL Goals Pt Will Transfer to Toilet: with min assist;ambulating;bedside commode Pt Will Perform Toileting - Clothing Manipulation and hygiene: with min assist;sit to/from stand Additional ADL Goal #1: pt will perform ub adls with set up in unsupported sitting Additional ADL Goal #2: pt will only need one cue to initiate and follow single step directions Additional ADL Goal #3: pt will perform LB adls with mod A, sit to stand  OT Frequency: Min 2X/week   Barriers to D/C:            Co-evaluation PT/OT/SLP Co-Evaluation/Treatment: Yes Reason for Co-Treatment: For patient/therapist safety PT goals addressed during session: Mobility/safety with mobility OT goals addressed during session: ADL's and self-care      AM-PAC PT "6 Clicks" Daily Activity     Outcome Measure Help from another  person eating meals?: A Little Help from another person taking care of personal grooming?: A Little Help from another person toileting, which includes using toliet, bedpan, or urinal?: A Lot Help from another person bathing (including washing, rinsing, drying)?: A Lot Help from another person to put on and taking off regular upper body clothing?: A Lot Help from another person to put on and taking off regular lower body clothing?: Total 6 Click Score: 13   End of Session Equipment Utilized During Treatment: Gait belt;Rolling walker;Oxygen  Activity Tolerance: Patient limited by fatigue Patient left: in bed;with call bell/phone within reach;with family/visitor present  OT Visit Diagnosis: Unsteadiness on feet (R26.81);Muscle weakness (generalized) (M62.81)                Time: 2376-2831 OT Time Calculation (min): 30 min Charges:  OT General Charges $OT Visit: 1 Visit OT Evaluation $OT Eval Moderate Complexity: 1 Mod G-Codes:     [place], OTR/L [phone] 12/30/2017  Halaina Vanduzer 12/30/2017, 3:26 PM

## 2017-12-30 NOTE — Consult Note (Addendum)
[facility] Nurse wound consult note Reason for Consult: Consult requested for bilat buttocks and left foot/heel.  Family members at the bedside state the patient spends long amts of time in a recliner at home and scoots to the edge of the chair to get up. He is frequently incontinent of stool while in the hospital.   Wound type:  There are patchy areas of partial thickness skin loss related to moisture and shear; left buttock .8X.8X.1cm, right buttock .5X.5X.1cm, both sites are dark reddish-purple and moist, multiple smaller areas of partial thickness loss are red and macerated and scattered across bilat buttocks.  Surrounding skin appearance is consistent with moisture associated skin damage.   Pressure Injury POA: These areas were present on admission but are NOT pressure injuries. Left heel with previous scar tissue from surgery; Yellow and dry calloused; .2X3cm, no odor, drainage, or fluctuance.   Left foot near ankle with healing full thickness wound; .2X.2X.2cm round opening which has never closed completely since surgery, according to wife at the bedside.  Narrow opening, unable to visualize wound bed.  No odor, drainage, or fluctuance. Dressing procedure/placement/frequency: Barrier cream to protect buttocks and repel moisture.  Bactroban to provide antimicrobial benefits to left heel and foot wounds.  Discussed plan of care with patient and family members. Please re-consult if further assistance is needed.  Thank-you,  Julien Girt MSN, [place], [place], [place], [place]

## 2017-12-31 DIAGNOSIS — R5381 Other malaise: Secondary | ICD-10-CM

## 2017-12-31 DIAGNOSIS — L8990 Pressure ulcer of unspecified site, unspecified stage: Secondary | ICD-10-CM

## 2017-12-31 LAB — CBC WITH DIFFERENTIAL/PLATELET
BASOS PCT: 0 %
Basophils Absolute: 0 10*3/uL (ref 0.0–0.1)
Eosinophils Absolute: 0.1 10*3/uL (ref 0.0–0.7)
Eosinophils Relative: 1 %
HEMATOCRIT: 26.4 % — AB (ref 39.0–52.0)
HEMOGLOBIN: 8 g/dL — AB (ref 13.0–17.0)
LYMPHS PCT: 11 %
Lymphs Abs: 1.3 10*3/uL (ref 0.7–4.0)
MCH: 24.2 pg — ABNORMAL LOW (ref 26.0–34.0)
MCHC: 30.3 g/dL (ref 30.0–36.0)
MCV: 80 fL (ref 78.0–100.0)
MONO ABS: 0.6 10*3/uL (ref 0.1–1.0)
Monocytes Relative: 5 %
NEUTROS PCT: 83 %
Neutro Abs: 10.2 10*3/uL — ABNORMAL HIGH (ref 1.7–7.7)
Platelets: 289 10*3/uL (ref 150–400)
RBC: 3.3 MIL/uL — AB (ref 4.22–5.81)
RDW: 16.2 % — AB (ref 11.5–15.5)
WBC: 12.3 10*3/uL — AB (ref 4.0–10.5)

## 2017-12-31 LAB — BASIC METABOLIC PANEL
Anion gap: 10 (ref 5–15)
BUN: 18 mg/dL (ref 6–20)
CALCIUM: 8.5 mg/dL — AB (ref 8.9–10.3)
CO2: 28 mmol/L (ref 22–32)
Chloride: 102 mmol/L (ref 101–111)
Creatinine, Ser: 1.37 mg/dL — ABNORMAL HIGH (ref 0.61–1.24)
GFR calc non Af Amer: 48 mL/min — ABNORMAL LOW (ref >60)
GFR, EST AFRICAN AMERICAN: 55 mL/min — AB (ref >60)
GLUCOSE: 135 mg/dL — AB (ref 65–99)
POTASSIUM: 4 mmol/L (ref 3.5–5.1)
Sodium: 140 mmol/L (ref 135–145)

## 2017-12-31 LAB — GLUCOSE, CAPILLARY
GLUCOSE-CAPILLARY: 158 mg/dL — AB (ref 65–99)
GLUCOSE-CAPILLARY: 290 mg/dL — AB (ref 65–99)
GLUCOSE-CAPILLARY: 280 mg/dL — AB (ref 65–99)
Glucose-Capillary: 126 mg/dL — ABNORMAL HIGH (ref 65–99)

## 2017-12-31 MED ORDER — HEPARIN SODIUM (PORCINE) 5000 UNIT/ML IJ SOLN
5000.0000 [IU] | Freq: Three times a day (TID) | INTRAMUSCULAR | Status: DC
  Administered 2017-12-31 – 2018-01-03 (×5): 5000 [IU] via SUBCUTANEOUS
  Filled 2017-12-31 (×6): qty 1

## 2017-12-31 MED ORDER — IPRATROPIUM-ALBUTEROL 0.5-2.5 (3) MG/3ML IN SOLN
3.0000 mL | Freq: Four times a day (QID) | RESPIRATORY_TRACT | Status: DC | PRN
  Administered 2018-01-02: 3 mL via RESPIRATORY_TRACT
  Filled 2017-12-31: qty 3

## 2017-12-31 MED ORDER — GUAIFENESIN ER 600 MG PO TB12
600.0000 mg | ORAL_TABLET | Freq: Two times a day (BID) | ORAL | Status: DC
  Administered 2017-12-31 – 2018-01-03 (×7): 600 mg via ORAL
  Filled 2017-12-31 (×7): qty 1

## 2017-12-31 NOTE — Progress Notes (Signed)
PROGRESS NOTE    [name] [name]  JSH:[id number] DOB: 1941/04/02 DOA: 12/29/2017 PCP: Prince Solian, MD   Brief Narrative:  Patient is a 77 year old male with history of prostate cancer, diabetes, neuropathy, oxygen dependent COPD, and deficiency anemia, debility who was admitted with cellulitis of the left lower extremity, worsening weakness, debility, acute on chronic renal failure.  Assessment & Plan:   Active Problems:   Cellulitis   Pressure injury of skin   1] cellulitis of the left lower extremity: - He got 1 dose of IV vancomycin in the emergency room.  - Currently improving with ceftriaxone. -Monitor WBC count while on antibiotics.  -lactic acid level improving.  12/31/2017: -Lower extremity cellulitis continues to improve. -Continue IV ceftriaxone. -WBC also improving.  Continue to monitor.  2] bilateral lower extremity weakness per family-patient was able to raise his leg up and move all his extremities. -He has diabetic neuropathy. -There was also mention of him not having control over his bowel movements he has intact strength without any saddle anesthesia.  Unlikely cord compression.  - sensation and movement present in both legs.   -PT/OT consulted.  12/31/2017: -His bilateral lower extremity strength has significantly improved. -PT/OT consulted.  Family agreeable to skilled nursing facility if needed.  3] acute on chronic CKD with initial creatinine 1.61.   - Gentle IV hydration.  Improving. -Holding Lasix for now. -Continue to monitor BUN/creatinine.   4] chronic bilateral pitting edema very minimal.  Patient had echocardiogram during his recent hospital stay with good ejection fraction 60-65%. - Improved.  5] O2 dependent COPD 12/31/2017: -He is having a few wheezes today.  Complaining of cough but unable to produce sputum. -  chest x-ray shows bibasilar atelectasis - Continue home inhalers, nebulizers as well as prednisone.  -Will order  Mucinex.  6] history of diabetes  - restarted glipizide; hold metformin. -Monitor Accu-Cheks, insulin sliding scale. -On diabetic diet.  7]History of prostate cancer continue Proscar.  8] hyperlipidemia continue Lipitor.  9] anemia of chronic disease continue iron tablets. - Transfuse if Hb<7.  10] anxiety: Stable on Prozac and BuSpar (home meds).  11] neuropathy continue Neurontin.  12] generalized weakness patient with 2 admissions this month.   - PT/OT consult requested. -Family agreeable for SNF if needed.   DVT prophylaxis: subcutaneous Heparin due to risk of DVT Code Status: DO NOT RESUSCITATE confirmed with patient Family Communication: Discussed with family Disposition Plan: To be determined Consults called: None   Subjective: Lower extremity cellulitis improving.  Has cough but unable to bring sputum.  Objective: Vitals:   12/30/17 1940 12/30/17 2151 12/31/17 0653 12/31/17 0754  BP:  (!) 126/48 (!) 144/68   Pulse:  69 79 75  Resp:  16 15 17   Temp:  (!) 97.4 F (36.3 C) (!) 97.5 F (36.4 C)   TempSrc:  Oral Oral   SpO2: 95% 99% 100% 98%  Weight:      Height:        Intake/Output Summary (Last 24 hours) at 12/31/2017 1029 Last data filed at 12/31/2017 0655 Gross per 24 hour  Intake 100 ml  Output 750 ml  Net -650 ml   Filed Weights   12/29/17 2046  Weight: 91.4 kg (201 lb 8 oz)    Examination:  General exam: Appears calm and comfortable  Respiratory system: occasional wheezing, no rhonchi. Respiratory effort normal. Cardiovascular system: S1 & S2 heard, RRR. No JVD, murmur heard, no rubs, gallops or clicks. No pedal edema. Gastrointestinal system:  Abdomen is nondistended, soft and nontender. No organomegaly or masses felt. Normal bowel sounds heard. Central nervous system: Alert and oriented.  No complaint of generalized weakness, no focal neurological deficits. Gait not assessed Extremities: Erythema Left shin improving.  Edema  improving, dry skin, no open lesions Skin: No rashes, lesions or ulcers Psychiatry: Judgement and insight appear normal. Mood & affect appropriate.    Data Reviewed: I have personally reviewed following labs and imaging studies  CBC: Recent Labs  Lab 12/29/17 1332 12/31/17 0430  WBC 15.5* 12.3*  NEUTROABS 12.9* 10.2*  HGB 9.2* 8.0*  HCT 30.6* 26.4*  MCV 81.8 80.0  PLT 335 315   Basic Metabolic Panel: Recent Labs  Lab 12/29/17 1332 12/30/17 0551 12/31/17 0430  NA 135 138 140  K 4.4 3.9 4.0  CL 91* 100* 102  CO2 29 29 28   GLUCOSE 193* 133* 135*  BUN 21* 17 18  CREATININE 1.61* 1.40* 1.37*  CALCIUM 9.5 8.5* 8.5*   GFR: Estimated Creatinine Clearance: 48.8 mL/min (A) (by C-G formula based on SCr of 1.37 mg/dL (H)). Liver Function Tests: Recent Labs  Lab 12/29/17 1332  AST 22  ALT 13*  ALKPHOS 82  BILITOT 0.7  PROT 7.4  ALBUMIN 3.1*   No results for input(s): LIPASE, AMYLASE in the last 168 hours. No results for input(s): AMMONIA in the last 168 hours. Coagulation Profile: No results for input(s): INR, PROTIME in the last 168 hours. Cardiac Enzymes: No results for input(s): CKTOTAL, CKMB, CKMBINDEX, TROPONINI in the last 168 hours. BNP (last 3 results) No results for input(s): PROBNP in the last 8760 hours. HbA1C: No results for input(s): HGBA1C in the last 72 hours. CBG: Recent Labs  Lab 12/30/17 0726 12/30/17 1155 12/30/17 1703 12/30/17 2014 12/31/17 0718  GLUCAP 132* 174* 219* 239* 126*   Lipid Profile: No results for input(s): CHOL, HDL, LDLCALC, TRIG, CHOLHDL, LDLDIRECT in the last 72 hours. Thyroid Function Tests: No results for input(s): TSH, T4TOTAL, FREET4, T3FREE, THYROIDAB in the last 72 hours. Anemia Panel: No results for input(s): VITAMINB12, FOLATE, FERRITIN, TIBC, IRON, RETICCTPCT in the last 72 hours. Sepsis Labs: Recent Labs  Lab 12/29/17 1338 12/29/17 1609 12/29/17 1829  LATICACIDVEN 3.58* 1.05 1.4    Recent Results (from  the past 240 hour(s))  Blood Culture (routine x 2)     Status: None (Preliminary result)   Collection Time: 12/29/17  2:47 PM  Result Value Ref Range Status   Specimen Description   Final    BLOOD LEFT HAND Performed at [facility], [place] [address] [place], [place] [address]    Special Requests   Final    BOTTLES DRAWN AEROBIC AND ANAEROBIC Blood Culture adequate volume Performed at [place] [address] [place], [place] [address]    Culture   Final    NO GROWTH < 24 HOURS Performed at [place] [address] [place], [place] [address]    Report Status PENDING  Incomplete  Blood Culture (routine x 2)     Status: None (Preliminary result)   Collection Time: 12/29/17  3:00 PM  Result Value Ref Range Status   Specimen Description   Final    BLOOD RIGHT WRIST Performed at [place] [address], [place],  [address]    Special Requests   Final    BOTTLES DRAWN AEROBIC AND ANAEROBIC Blood Culture adequate volume Performed at [facility] [address] [place],  [place] [address]    Culture   Final    NO GROWTH < 24 HOURS Performed at [facility], [place] [address], [place], [place] [address]    Report Status PENDING  Incomplete         Radiology Studies: Dg Chest 2 View  Result Date: 12/29/2017 CLINICAL DATA:  Chest pain. EXAM: CHEST - 2 VIEW COMPARISON:  Radiographs of December 17, 2017. FINDINGS: Stable cardiomediastinal silhouette. No pneumothorax is noted. Mild bibasilar subsegmental atelectasis is noted. No significant pleural effusion is noted. Bony thorax is unremarkable. IMPRESSION: Mild bibasilar subsegmental atelectasis. Electronically Signed   By: Marijo Conception, M.D.   On: 12/29/2017 14:09   Scheduled Meds: . albuterol  2.5 mg Nebulization TID  . aspirin  325 mg Oral Daily  . atorvastatin  10 mg Oral Daily  . busPIRone  10 mg Oral BID  .  cholecalciferol  1,000 Units Oral Daily  . ferrous sulfate  325 mg Oral Q breakfast  . finasteride  5 mg Oral Daily  . FLUoxetine  60 mg Oral Daily  . fluticasone furoate-vilanterol  1 puff Inhalation Daily  . gabapentin  300 mg Oral QHS  . glipiZIDE  2.5 mg Oral BID AC  . insulin aspart  0-9 Units Subcutaneous TID WC  . multivitamin with minerals  1 tablet Oral Daily  . mupirocin cream   Topical Daily  . nicotine  7 mg Transdermal Daily  . predniSONE  20 mg Oral Q breakfast  . tiotropium  18 mcg Inhalation Daily  . vitamin B-12  100 mcg Oral Daily   Continuous Infusions: . cefTRIAXone (ROCEPHIN)  IV Stopped (12/30/17 2038)     LOS: 2 days    Beverely Pace, MD [facility] Pager [phone]  If 7PM-7AM, please contact night-coverage [email or web address] Password [facility] 12/31/2017, 10:29 AM

## 2017-12-31 NOTE — Progress Notes (Signed)
Pt will be transferred to 3w room 24. Report given to 3w nurse, all questions were answered. Patient and wife informed.

## 2018-01-01 DIAGNOSIS — L0390 Cellulitis, unspecified: Secondary | ICD-10-CM

## 2018-01-01 LAB — BASIC METABOLIC PANEL
Anion gap: 9 (ref 5–15)
BUN: 24 mg/dL — ABNORMAL HIGH (ref 6–20)
CHLORIDE: 104 mmol/L (ref 101–111)
CO2: 29 mmol/L (ref 22–32)
Calcium: 9 mg/dL (ref 8.9–10.3)
Creatinine, Ser: 1.57 mg/dL — ABNORMAL HIGH (ref 0.61–1.24)
GFR calc non Af Amer: 41 mL/min — ABNORMAL LOW (ref >60)
GFR, EST AFRICAN AMERICAN: 47 mL/min — AB (ref >60)
Glucose, Bld: 151 mg/dL — ABNORMAL HIGH (ref 65–99)
POTASSIUM: 4.9 mmol/L (ref 3.5–5.1)
SODIUM: 142 mmol/L (ref 135–145)

## 2018-01-01 LAB — CBC
HEMATOCRIT: 27.1 % — AB (ref 39.0–52.0)
HEMOGLOBIN: 8.1 g/dL — AB (ref 13.0–17.0)
MCH: 24.3 pg — ABNORMAL LOW (ref 26.0–34.0)
MCHC: 29.9 g/dL — ABNORMAL LOW (ref 30.0–36.0)
MCV: 81.1 fL (ref 78.0–100.0)
Platelets: 270 10*3/uL (ref 150–400)
RBC: 3.34 MIL/uL — AB (ref 4.22–5.81)
RDW: 16.2 % — ABNORMAL HIGH (ref 11.5–15.5)
WBC: 11.2 10*3/uL — ABNORMAL HIGH (ref 4.0–10.5)

## 2018-01-01 LAB — GLUCOSE, CAPILLARY
GLUCOSE-CAPILLARY: 210 mg/dL — AB (ref 65–99)
Glucose-Capillary: 128 mg/dL — ABNORMAL HIGH (ref 65–99)
Glucose-Capillary: 210 mg/dL — ABNORMAL HIGH (ref 65–99)
Glucose-Capillary: 319 mg/dL — ABNORMAL HIGH (ref 65–99)

## 2018-01-01 MED ORDER — HALOPERIDOL 0.5 MG PO TABS: 1.0000 mg | ORAL_TABLET | Freq: Three times a day (TID) | ORAL | Status: DC | PRN

## 2018-01-01 MED ORDER — DOXYCYCLINE HYCLATE 100 MG PO TABS
100.0000 mg | ORAL_TABLET | Freq: Once | ORAL | Status: AC
Start: 2018-01-01 — End: 2018-01-01
  Administered 2018-01-01: 100 mg via ORAL
  Filled 2018-01-01: qty 1

## 2018-01-01 NOTE — Clinical Social Work Note (Signed)
Clinical Social Work Assessment  Patient Details  Name: [name] [name] MRN: [id number] Date of Birth: 11/28/1940  Date of referral:  01/01/18               Reason for consult:  Facility Placement                Permission sought to share information with:  Family Supports Permission granted to share information::  Yes, Verbal Permission Granted  Name::       July jr,Derral  Agency::  SNF   Relationship::   Son   Contact Information:    [phone]  Housing/Transportation Living arrangements for the past 2 months:  Strong of Information:  Patient, Adult Children, Other (Comment Required) Patient Interpreter Needed:  None Criminal Activity/Legal Involvement Pertinent to Current Situation/Hospitalization:  No - Comment as needed Significant Relationships:  Adult Children, Other Family Members, Spouse Lives with:  Spouse Do you feel safe going back to the place where you live?  No Need for family participation in patient care:  Yes (Dependent with mobility)  Care giving concerns:   Patient admitted for weakness and evaluated by physical therapy for SNF rehab. Patient and son agreeable.  Patient report he walks with a walker at home. Patient has been deconditioned and weak and has not been able to get around even with the assistance of the walker. Patient states he can usually complete his own tolieting and his spouse helps with his meal preparations.   Social Worker assessment / plan:  CSW met with patient at bedside, explained role and reason for visit to assist with patient d/c to skill nursing. Patient alert and oriented x4 but preferred CSW to talk with his son about SNF process/options.  CSW called pt. Son and daughter in law and informed them of the SNF process and insurance authorization process. Insurance cannot be initiated on weekend due to hours of operation.  CSW left a SNF list in room for patient son to reference. CSW explain once the patient clinical  information has been reviewed by SNF's the CSW will follow up with bed offers later.   Plan: SNF  Employment status:  Retired Forensic scientist:  Medicare PT Recommendations:  [place] / Referral to community resources:  [place]  Patient/Family's Response to care:  Agreeable and Responding to care. Appreciative of CSW services.  Patient/Family's Understanding of and Emotional Response to Diagnosis, Current Treatment, and Prognosis: Patient has good understanding of his diagnosis. Patient spouse, son and family are very supportive of the patient. Patient is looking forward to a few weeks of rehab and transition home.   Emotional Assessment Appearance:  Appears stated age Attitude/Demeanor/Rapport:    Affect (typically observed):  Accepting, Pleasant Orientation:  Oriented to Self, Oriented to Place, Oriented to  Time, Oriented to Situation Alcohol / Substance use:  Not Applicable Psych involvement (Current and /or in the community):  No (Comment)  Discharge Needs  Concerns to be addressed:  Discharge Planning Concerns, Decision making concerns Readmission within the last 30 days:  No Current discharge risk:  Dependent with Mobility Barriers to Discharge:  Continued Medical Work up, [place], LCSW 01/01/2018, 2:27 PM

## 2018-01-01 NOTE — NC FL2 (Signed)
[place] LEVEL OF CARE SCREENING TOOL     IDENTIFICATION  Patient Name: [name] [name] Birthdate: [birth year] Sex: male Admission Date (Current Location): 12/29/2017  [place] and [place] Number:  Kathleen Argue [id number] [place] and Address:  [facility],  [place] [address], [place]      Provider Number: [id number]  Attending Physician Name and Address:  Hosie Poisson, MD  Relative Name and Phone Number:       Current Level of Care: Hospital Recommended Level of Care: [place] Prior Approval Number:    Date Approved/Denied:   PASRR Number:    [id number]  Discharge Plan: SNF    Current Diagnoses: Patient Active Problem List   Diagnosis Date Noted  . Pressure injury of skin 12/31/2017  . Cellulitis 12/29/2017  . Hypoglycemia 12/17/2017  . Leg weakness 11/21/2015  . Lumbar canal stenosis 11/21/2015  . Open wound of heel 02/15/2015  . History of surgical procedure 11/16/2014  . Chronic obstructive pulmonary disease ([place]) 10/31/2014  . Type 2 diabetes mellitus ([place]) 10/31/2014  . BP (high blood pressure) 10/31/2014  . Acquired cavovarus deformity of foot 04/19/2014  . Acquired equinus deformity of foot 04/19/2014  . Ankle contracture 03/28/2014  . Rhabdomyolysis 07/05/2013  . COPD exacerbation ([place]) 07/05/2013  . H/O prostate cancer 07/04/2013  . Diabetes mellitus ([place]) 07/04/2013  . HTN (hypertension) 07/04/2013  . History of tobacco abuse 07/04/2013  . Fall 07/04/2013  . Thigh pain, musculoskeletal 07/04/2013  . Hypoxia 07/04/2013  . HLD (hyperlipidemia) 07/04/2013  . Depression 07/04/2013  . Chronic kidney disease 07/04/2013    Orientation RESPIRATION BLADDER Height & Weight     Self, Time, Place  Normal Continent Weight: 201 lb 15.1 oz (91.6 kg) Height:  6' (182.9 cm)  BEHAVIORAL SYMPTOMS/MOOD NEUROLOGICAL BOWEL NUTRITION STATUS      Continent Diet(Carb Modified. )  AMBULATORY STATUS COMMUNICATION OF  NEEDS Skin   Extensive Assist Verbally Normal                       Personal Care Assistance Level of Assistance  Bathing, Feeding, Dressing Bathing Assistance: Limited assistance Feeding assistance: Independent Dressing Assistance: Limited assistance     Functional Limitations Info  Sight, Hearing, Speech Sight Info: Adequate Hearing Info: Adequate Speech Info: Adequate    SPECIAL CARE FACTORS FREQUENCY  PT (By licensed PT), OT (By licensed OT)     PT Frequency: 5x/week OT Frequency: 5x/week            Contractures Contractures Info: Not present    Additional Factors Info  Psychotropic, Insulin Sliding Scale Code Status Info: Fullcode Allergies Info: Allergies: Oxycodone Psychotropic Info: Prozac Insulin Sliding Scale Info: 3x's daily with meals       Current Medications (01/01/2018):  This is the current hospital active medication list Current Facility-Administered Medications  Medication Dose Route Frequency Provider Last Rate Last Dose  . albuterol (PROVENTIL) (2.5 MG/3ML) 0.083% nebulizer solution 2.5 mg  2.5 mg Nebulization TID Matcha, Anupama, MD   2.5 mg at 01/01/18 1433  . aspirin tablet 325 mg  325 mg Oral Daily Georgette Shell, MD   325 mg at 01/01/18 0945  . atorvastatin (LIPITOR) tablet 10 mg  10 mg Oral Daily Georgette Shell, MD   10 mg at 01/01/18 0945  . busPIRone (BUSPAR) tablet 10 mg  10 mg Oral BID Georgette Shell, MD   10 mg at 01/01/18 0945  . cefTRIAXone (ROCEPHIN) 1  g in sodium chloride 0.9 % 100 mL IVPB  1 g Intravenous Q24H Georgette Shell, MD   Stopped at 12/31/17 2245  . cholecalciferol (VITAMIN D) tablet 1,000 Units  1,000 Units Oral Daily Georgette Shell, MD   1,000 Units at 01/01/18 [phone]  . ferrous sulfate tablet 325 mg  325 mg Oral Q breakfast Georgette Shell, MD   325 mg at 01/01/18 5400  . finasteride (PROSCAR) tablet 5 mg  5 mg Oral Daily Georgette Shell, MD   5 mg at 01/01/18 0945  . FLUoxetine  (PROZAC) capsule 60 mg  60 mg Oral Daily Georgette Shell, MD   60 mg at 01/01/18 0945  . fluticasone furoate-vilanterol (BREO ELLIPTA) 200-25 MCG/INH 1 puff  1 puff Inhalation Daily Georgette Shell, MD   1 puff at 01/01/18 [phone]  . gabapentin (NEURONTIN) capsule 300 mg  300 mg Oral QHS Georgette Shell, MD   300 mg at 12/31/17 2052  . glipiZIDE (GLUCOTROL) tablet 2.5 mg  2.5 mg Oral BID AC Georgette Shell, MD   2.5 mg at 01/01/18 0815  . guaiFENesin (MUCINEX) 12 hr tablet 600 mg  600 mg Oral BID Matcha, Anupama, MD   600 mg at 01/01/18 0944  . heparin injection 5,000 Units  5,000 Units Subcutaneous Q8H Matcha, Anupama, MD   5,000 Units at 01/01/18 0530  . insulin aspart (novoLOG) injection 0-9 Units  0-9 Units Subcutaneous TID WC Matcha, Anupama, MD   3 Units at 01/01/18 1232  . ipratropium-albuterol (DUONEB) 0.5-2.5 (3) MG/3ML nebulizer solution 3 mL  3 mL Nebulization Q6H PRN Matcha, Anupama, MD      . multivitamin with minerals tablet 1 tablet  1 tablet Oral Daily Georgette Shell, MD   1 tablet at 01/01/18 0945  . mupirocin cream (BACTROBAN) 2 %   Topical Daily Avva, Ravisankar, MD      . nicotine (NICODERM CQ - dosed in mg/24 hr) patch 7 mg  7 mg Transdermal Daily Georgette Shell, MD   7 mg at 01/01/18 0945  . oxyCODONE-acetaminophen (PERCOCET/ROXICET) 5-325 MG per tablet 1 tablet  1 tablet Oral Q6H PRN Georgette Shell, MD      . polyvinyl alcohol (LIQUIFILM TEARS) 1.4 % ophthalmic solution 1 drop  1 drop Both Eyes PRN Georgette Shell, MD      . predniSONE (DELTASONE) tablet 20 mg  20 mg Oral Q breakfast Georgette Shell, MD   20 mg at 01/01/18 0815  . senna-docusate (Senokot-S) tablet 1 tablet  1 tablet Oral Daily PRN Georgette Shell, MD      . tiotropium [facility]) inhalation capsule 18 mcg  18 mcg Inhalation Daily Georgette Shell, MD   18 mcg at 01/01/18 0746  . vitamin B-12 (CYANOCOBALAMIN) tablet 100 mcg  100 mcg Oral Daily Georgette Shell, MD   100 mcg at 01/01/18 1950     Discharge Medications: Please see discharge summary for a list of discharge medications.  Relevant Imaging Results:  Relevant Lab Results:   Additional Information [place] Rexton Greulich, LCSW

## 2018-01-01 NOTE — Progress Notes (Addendum)
PROGRESS NOTE    [name] [name]  HBZ:[id number] DOB: 09-04-1941 DOA: 12/29/2017 PCP: Prince Solian, MD    Brief Narrative: Patient is a 77 year old male with history of prostate cancer, diabetes, neuropathy, oxygen dependent COPD, and deficiency anemia, debility who was admitted with cellulitis of the left lower extremity, worsening weakness, debility, acute on chronic renal failure.    Assessment & Plan:   Active Problems:   Cellulitis   Pressure injury of skin   Cellulitis of the left lower extremity: - improving, on IV rocephin.  - wound care consulted.  Afebrile , improving leukocytosis.    Heel ulcer: Slight pus drainage.    Bilateral lower extremity weakness: Secondary to deconditioning. PT/OT eval, recommending at SNF on discharge.    Acute on Stage 3 CKD Improving.  His creatinine at baseline is less than 1.    Pedal edema:  Resolved.  Echo with preserved EF.    COPD: No wheezing heard today.  RESUME home medications.  No change in meds.    Diabetes Mellitus:  CBG (last 3)  Recent Labs    01/01/18 0753 01/01/18 1208 01/01/18 1646  GLUCAP 128* 210* 319*   Resume SSI.  Currently on glipizide.    Tobacco abuse; ON nicotine patch.       DVT prophylaxis: (heparin) Code Status:full code.  Family Communication: multiple family members at bedside.  Disposition Plan: pending. SNF on Monday.    Consultants:   None.    Procedures: none.   Antimicrobials:  Rocephin.    Subjective: No new complaints.   Objective: Vitals:   01/01/18 0542 01/01/18 0746 01/01/18 1433 01/01/18 1449  BP: 123/68   (!) 145/60  Pulse: 62   84  Resp: 14   20  Temp: 97.6 F (36.4 C)   98.6 F (37 C)  TempSrc: Oral   Oral  SpO2: 100% 98% 93% 99%  Weight: 91.6 kg (201 lb 15.1 oz)     Height:        Intake/Output Summary (Last 24 hours) at 01/01/2018 1733 Last data filed at 01/01/2018 0500 Gross per 24 hour  Intake -  Output 650 ml  Net -650 ml    Filed Weights   12/29/17 2046 01/01/18 0542  Weight: 91.4 kg (201 lb 8 oz) 91.6 kg (201 lb 15.1 oz)    Examination:  General exam: Appears calm and comfortable on 3.5 lit of [place] oxygen.  Respiratory system: Clear to auscultation. Respiratory effort normal. Cardiovascular system: S1 & S2 heard, RRR. No JVD, murmurs, . No pedal edema. Gastrointestinal system: Abdomen is nondistended, soft and nontender. No organomegaly or masses felt. Normal bowel sounds heard. Central nervous system: Alert and oriented. No focal neurological deficits. Extremities: small ulcer over the left heel. Erythema and swelling of the bilateral lower extremities have improved. Non tender.  Skin: see above.  Psychiatry: Judgement and insight appear normal. Mood & affect appropriate.     Data Reviewed: I have personally reviewed following labs and imaging studies  CBC: Recent Labs  Lab 12/29/17 1332 12/31/17 0430 01/01/18 0343  WBC 15.5* 12.3* 11.2*  NEUTROABS 12.9* 10.2*  --   HGB 9.2* 8.0* 8.1*  HCT 30.6* 26.4* 27.1*  MCV 81.8 80.0 81.1  PLT 335 289 101   Basic Metabolic Panel: Recent Labs  Lab 12/29/17 1332 12/30/17 0551 12/31/17 0430 01/01/18 0343  NA 135 138 140 142  K 4.4 3.9 4.0 4.9  CL 91* 100* 102 104  CO2 29 29 28  29  GLUCOSE 193* 133* 135* 151*  BUN 21* 17 18 24*  CREATININE 1.61* 1.40* 1.37* 1.57*  CALCIUM 9.5 8.5* 8.5* 9.0   GFR: Estimated Creatinine Clearance: 42.6 mL/min (A) (by C-G formula based on SCr of 1.57 mg/dL (H)). Liver Function Tests: Recent Labs  Lab 12/29/17 1332  AST 22  ALT 13*  ALKPHOS 82  BILITOT 0.7  PROT 7.4  ALBUMIN 3.1*   No results for input(s): LIPASE, AMYLASE in the last 168 hours. No results for input(s): AMMONIA in the last 168 hours. Coagulation Profile: No results for input(s): INR, PROTIME in the last 168 hours. Cardiac Enzymes: No results for input(s): CKTOTAL, CKMB, CKMBINDEX, TROPONINI in the last 168 hours. BNP (last 3  results) No results for input(s): PROBNP in the last 8760 hours. HbA1C: No results for input(s): HGBA1C in the last 72 hours. CBG: Recent Labs  Lab 12/31/17 1653 12/31/17 2051 01/01/18 0753 01/01/18 1208 01/01/18 1646  GLUCAP 290* 280* 128* 210* 319*   Lipid Profile: No results for input(s): CHOL, HDL, LDLCALC, TRIG, CHOLHDL, LDLDIRECT in the last 72 hours. Thyroid Function Tests: No results for input(s): TSH, T4TOTAL, FREET4, T3FREE, THYROIDAB in the last 72 hours. Anemia Panel: No results for input(s): VITAMINB12, FOLATE, FERRITIN, TIBC, IRON, RETICCTPCT in the last 72 hours. Sepsis Labs: Recent Labs  Lab 12/29/17 1338 12/29/17 1609 12/29/17 1829  LATICACIDVEN 3.58* 1.05 1.4    Recent Results (from the past 240 hour(s))  Blood Culture (routine x 2)     Status: None (Preliminary result)   Collection Time: 12/29/17  2:47 PM  Result Value Ref Range Status   Specimen Description   Final    BLOOD LEFT HAND Performed at [facility], [place] [address] [place], [place] [address]    Special Requests   Final    BOTTLES DRAWN AEROBIC AND ANAEROBIC Blood Culture adequate volume Performed at [place] [address] [place], [place] [address]    Culture   Final    NO GROWTH 3 DAYS Performed at [facility], [place] [address], [place], [place] [address]    Report Status PENDING  Incomplete  Blood Culture (routine x 2)     Status: None (Preliminary result)   Collection Time: 12/29/17  3:00 PM  Result Value Ref Range Status   Specimen Description   Final    BLOOD RIGHT WRIST Performed at [place] [address] [place], [place] [address]    Special Requests   Final    BOTTLES DRAWN AEROBIC AND ANAEROBIC Blood Culture adequate volume Performed at [place] [address], [place], [place] [address]    Culture   Final    NO GROWTH 3 DAYS Performed at [facility], [place]  [address], [place], [place] [address]    Report Status PENDING  Incomplete         Radiology Studies: No results found.      Scheduled Meds: . albuterol  2.5 mg Nebulization TID  . aspirin  325 mg Oral Daily  . atorvastatin  10 mg Oral Daily  . busPIRone  10 mg Oral BID  . cholecalciferol  1,000 Units Oral Daily  . ferrous sulfate  325 mg Oral Q breakfast  . finasteride  5 mg Oral Daily  . FLUoxetine  60 mg Oral Daily  . fluticasone furoate-vilanterol  1 puff Inhalation Daily  . gabapentin  300 mg Oral QHS  . glipiZIDE  2.5  mg Oral BID AC  . guaiFENesin  600 mg Oral BID  . heparin injection (subcutaneous)  5,000 Units Subcutaneous Q8H  . insulin aspart  0-9 Units Subcutaneous TID WC  . multivitamin with minerals  1 tablet Oral Daily  . mupirocin cream   Topical Daily  . nicotine  7 mg Transdermal Daily  . predniSONE  20 mg Oral Q breakfast  . tiotropium  18 mcg Inhalation Daily  . vitamin B-12  100 mcg Oral Daily   Continuous Infusions: . cefTRIAXone (ROCEPHIN)  IV Stopped (12/31/17 2245)     LOS: 3 days    Time spent: 35 minutes.     Hosie Poisson, MD [facility] Pager [phone]  If 7PM-7AM, please contact night-coverage [email or web address] Password [facility] 01/01/2018, 5:33 PM

## 2018-01-02 LAB — GLUCOSE, CAPILLARY
GLUCOSE-CAPILLARY: 117 mg/dL — AB (ref 65–99)
GLUCOSE-CAPILLARY: 113 mg/dL — AB (ref 65–99)
Glucose-Capillary: 281 mg/dL — ABNORMAL HIGH (ref 65–99)
Glucose-Capillary: 266 mg/dL — ABNORMAL HIGH (ref 65–99)

## 2018-01-02 MED ORDER — ADULT MULTIVITAMIN W/MINERALS CH
1.0000 | ORAL_TABLET | Freq: Every day | ORAL | Status: DC
  Administered 2018-01-02 – 2018-01-03 (×2): 1 via ORAL
  Filled 2018-01-02: qty 1

## 2018-01-02 MED ORDER — DOXYCYCLINE HYCLATE 100 MG PO TABS
100.0000 mg | ORAL_TABLET | Freq: Two times a day (BID) | ORAL | Status: DC
  Administered 2018-01-02 – 2018-01-03 (×3): 100 mg via ORAL
  Filled 2018-01-02 (×3): qty 1

## 2018-01-02 NOTE — Progress Notes (Signed)
Pt had a state of confusion around 1700 yesterday where he was verbally aggressive with family members / staff and wanted to go home. Throughout the night, patient has been calm and cooperative until about 2:00am - he tried to get out of bed and stated he was going home - he was again aggressive verbally to the wife who is at bedside - after some redirection patient seems to be calm again and resting. This is not baseline for the patient and will continue to monitor.

## 2018-01-02 NOTE — Progress Notes (Signed)
PROGRESS NOTE    [name] [name]  ELF:[id number] DOB: 14-Dec-1940 DOA: 12/29/2017 PCP: Prince Solian, MD    Brief Narrative: Patient is a 76 year old male with history of prostate cancer, diabetes, neuropathy, oxygen dependent COPD, and deficiency anemia, debility who was admitted with cellulitis of the left lower extremity, worsening weakness, debility, acute on chronic renal failure.    Assessment & Plan:   Active Problems:   Cellulitis   Pressure injury of skin   Cellulitis of the left lower extremity: - improving, on IV rocephin change to oral doxycycline.  - wound care consulted.  Afebrile , improving leukocytosis.    Heel ulcer: Slight pus drainage. Pain controlled.    Bilateral lower extremity weakness: Secondary to deconditioning. PT/OT eval, recommending at SNF on discharge.    Acute on Stage 3 CKD Improving.  His creatinine at baseline is less than 1.    Pedal edema:  Resolved.  Echo with preserved EF.    COPD: No wheezing heard today.  RESUME home medications.  No change in meds.    Diabetes Mellitus:  CBG (last 3)  Recent Labs    01/02/18 0756 01/02/18 1156 01/02/18 1725  GLUCAP 117* 113* 281*   Resume SSI.  Currently on glipizide.    Tobacco abuse; ON nicotine patch.       DVT prophylaxis: (heparin) Code Status:full code.  Family Communication: multiple family members at bedside.  Disposition Plan: pending. SNF on Monday.    Consultants:   None.    Procedures: none.   Antimicrobials:  Rocephin.    Subjective: No new complaints.   Objective: Vitals:   01/02/18 0413 01/02/18 0926 01/02/18 1313 01/02/18 1601  BP: 132/67  130/63   Pulse: 76  93   Resp: 12  18   Temp: 98.4 F (36.9 C)  97.9 F (36.6 C)   TempSrc: Oral  Oral   SpO2: 95% 95% 96% 96%  Weight: 90.7 kg (199 lb 15.3 oz)     Height:        Intake/Output Summary (Last 24 hours) at 01/02/2018 1821 Last data filed at 01/02/2018 1313 Gross per 24 hour   Intake 840 ml  Output 2150 ml  Net -1310 ml   Filed Weights   12/29/17 2046 01/01/18 0542 01/02/18 0413  Weight: 91.4 kg (201 lb 8 oz) 91.6 kg (201 lb 15.1 oz) 90.7 kg (199 lb 15.3 oz)    Examination:  General exam: Appears calm and comfortable on 3.5 lit of [place] oxygen.  Respiratory system: Clear to auscultation. Respiratory effort normal. No wheezing heard.  Cardiovascular system: S1 & S2 heard, RRR. No JVD, murmurs, . No pedal edema. Gastrointestinal system: Abdomen is nondistended, soft and nontender. No organomegaly or masses felt. Normal bowel sounds heard. Central nervous system: Alert, but slightly confused.  Extremities: small ulcer over the left heel. Erythema and swelling of the bilateral lower extremities have improved. Skin: see above.  Psychiatry: Mood & affect appropriate.     Data Reviewed: I have personally reviewed following labs and imaging studies  CBC: Recent Labs  Lab 12/29/17 1332 12/31/17 0430 01/01/18 0343  WBC 15.5* 12.3* 11.2*  NEUTROABS 12.9* 10.2*  --   HGB 9.2* 8.0* 8.1*  HCT 30.6* 26.4* 27.1*  MCV 81.8 80.0 81.1  PLT 335 289 585   Basic Metabolic Panel: Recent Labs  Lab 12/29/17 1332 12/30/17 0551 12/31/17 0430 01/01/18 0343  NA 135 138 140 142  K 4.4 3.9 4.0 4.9  CL 91* 100* 102  104  CO2 29 29 28 29   GLUCOSE 193* 133* 135* 151*  BUN 21* 17 18 24*  CREATININE 1.61* 1.40* 1.37* 1.57*  CALCIUM 9.5 8.5* 8.5* 9.0   GFR: Estimated Creatinine Clearance: 42.6 mL/min (A) (by C-G formula based on SCr of 1.57 mg/dL (H)). Liver Function Tests: Recent Labs  Lab 12/29/17 1332  AST 22  ALT 13*  ALKPHOS 82  BILITOT 0.7  PROT 7.4  ALBUMIN 3.1*   No results for input(s): LIPASE, AMYLASE in the last 168 hours. No results for input(s): AMMONIA in the last 168 hours. Coagulation Profile: No results for input(s): INR, PROTIME in the last 168 hours. Cardiac Enzymes: No results for input(s): CKTOTAL, CKMB, CKMBINDEX, TROPONINI in the last  168 hours. BNP (last 3 results) No results for input(s): PROBNP in the last 8760 hours. HbA1C: No results for input(s): HGBA1C in the last 72 hours. CBG: Recent Labs  Lab 01/01/18 1646 01/01/18 2039 01/02/18 0756 01/02/18 1156 01/02/18 1725  GLUCAP 319* 210* 117* 113* 281*   Lipid Profile: No results for input(s): CHOL, HDL, LDLCALC, TRIG, CHOLHDL, LDLDIRECT in the last 72 hours. Thyroid Function Tests: No results for input(s): TSH, T4TOTAL, FREET4, T3FREE, THYROIDAB in the last 72 hours. Anemia Panel: No results for input(s): VITAMINB12, FOLATE, FERRITIN, TIBC, IRON, RETICCTPCT in the last 72 hours. Sepsis Labs: Recent Labs  Lab 12/29/17 1338 12/29/17 1609 12/29/17 1829  LATICACIDVEN 3.58* 1.05 1.4    Recent Results (from the past 240 hour(s))  Blood Culture (routine x 2)     Status: None (Preliminary result)   Collection Time: 12/29/17  2:47 PM  Result Value Ref Range Status   Specimen Description   Final    BLOOD LEFT HAND Performed at [facility], [place] [address] [place], [place] [address]    Special Requests   Final    BOTTLES DRAWN AEROBIC AND ANAEROBIC Blood Culture adequate volume Performed at [place] [address] [place], [place] [address]    Culture   Final    NO GROWTH 4 DAYS Performed at [facility], [place] [address], [place], [place] [address]    Report Status PENDING  Incomplete  Blood Culture (routine x 2)     Status: None (Preliminary result)   Collection Time: 12/29/17  3:00 PM  Result Value Ref Range Status   Specimen Description   Final    BLOOD RIGHT WRIST Performed at [place] [address], [place], [place] [address]    Special Requests   Final    BOTTLES DRAWN AEROBIC AND ANAEROBIC Blood Culture adequate volume Performed at [place] [address], [place], [place] [address]    Culture   Final    NO GROWTH 4 DAYS Performed at [facility], [place] [address], [place],  [address]    Report Status PENDING  Incomplete         Radiology Studies: No results found.      Scheduled Meds: . albuterol  2.5 mg Nebulization TID  . aspirin  325 mg Oral Daily  . atorvastatin  10 mg Oral Daily  . busPIRone  10 mg Oral BID  . cholecalciferol  1,000 Units Oral Daily  . doxycycline  100 mg Oral Q12H  . ferrous sulfate  325 mg Oral Q breakfast  . finasteride  5 mg Oral Daily  . FLUoxetine  60 mg Oral Daily  . fluticasone furoate-vilanterol  1  puff Inhalation Daily  . gabapentin  300 mg Oral QHS  . glipiZIDE  2.5 mg Oral BID AC  . guaiFENesin  600 mg Oral BID  . heparin injection (subcutaneous)  5,000 Units Subcutaneous Q8H  . insulin aspart  0-9 Units Subcutaneous TID WC  . multivitamin with minerals  1 tablet Oral Q lunch  . mupirocin cream   Topical Daily  . nicotine  7 mg Transdermal Daily  . predniSONE  20 mg Oral Q breakfast  . tiotropium  18 mcg Inhalation Daily  . vitamin B-12  100 mcg Oral Daily   Continuous Infusions:    LOS: 4 days    Time spent: 35 minutes.     Hosie Poisson, MD [facility] Pager [phone]  If 7PM-7AM, please contact night-coverage [email or web address] Password [facility] 01/02/2018, 6:21 PM

## 2018-01-03 DIAGNOSIS — I872 Venous insufficiency (chronic) (peripheral): Secondary | ICD-10-CM | POA: Diagnosis not present

## 2018-01-03 DIAGNOSIS — C61 Malignant neoplasm of prostate: Secondary | ICD-10-CM | POA: Diagnosis not present

## 2018-01-03 DIAGNOSIS — E1122 Type 2 diabetes mellitus with diabetic chronic kidney disease: Secondary | ICD-10-CM | POA: Diagnosis not present

## 2018-01-03 DIAGNOSIS — I10 Essential (primary) hypertension: Secondary | ICD-10-CM | POA: Diagnosis not present

## 2018-01-03 DIAGNOSIS — Z9981 Dependence on supplemental oxygen: Secondary | ICD-10-CM | POA: Diagnosis not present

## 2018-01-03 DIAGNOSIS — F39 Unspecified mood [affective] disorder: Secondary | ICD-10-CM | POA: Diagnosis not present

## 2018-01-03 DIAGNOSIS — L89629 Pressure ulcer of left heel, unspecified stage: Secondary | ICD-10-CM

## 2018-01-03 DIAGNOSIS — R269 Unspecified abnormalities of gait and mobility: Secondary | ICD-10-CM | POA: Diagnosis not present

## 2018-01-03 DIAGNOSIS — N183 Chronic kidney disease, stage 3 (moderate): Secondary | ICD-10-CM | POA: Diagnosis not present

## 2018-01-03 DIAGNOSIS — M6289 Other specified disorders of muscle: Secondary | ICD-10-CM | POA: Diagnosis not present

## 2018-01-03 LAB — CULTURE, BLOOD (ROUTINE X 2)
CULTURE: NO GROWTH
Culture: NO GROWTH
SPECIAL REQUESTS: ADEQUATE
Special Requests: ADEQUATE

## 2018-01-03 LAB — GLUCOSE, CAPILLARY
Glucose-Capillary: 123 mg/dL — ABNORMAL HIGH (ref 65–99)
Glucose-Capillary: 196 mg/dL — ABNORMAL HIGH (ref 65–99)

## 2018-01-03 LAB — CREATININE, SERUM
Creatinine, Ser: 1.44 mg/dL — ABNORMAL HIGH (ref 0.61–1.24)
GFR calc Af Amer: 52 mL/min — ABNORMAL LOW (ref >60)
GFR, EST NON AFRICAN AMERICAN: 45 mL/min — AB (ref >60)

## 2018-01-03 MED ORDER — DOXYCYCLINE HYCLATE 100 MG PO TABS: 100.0000 mg | ORAL_TABLET | Freq: Two times a day (BID) | ORAL | 0 refills | Status: AC

## 2018-01-03 NOTE — Discharge Summary (Signed)
Physician Discharge Summary  [name] WJX:[id number] DOB: [birth year] DOA: 12/29/2017  PCP: Prince Solian, MD  Admit date: 12/29/2017 Discharge date: 01/03/2018  Admitted From: Home. ) Disposition:  SNF  Recommendations for Outpatient Follow-up:  1. Follow up with PCP in 1-2 weeks 2. Please obtain BMP/CBC in one week 3. Please follow up with wound care as recommended.     Discharge Condition: stable.  CODE STATUS:full code.  Diet recommendation: Heart Healthy  Brief/Interim Summary: Patient is a 77 year old male with history of prostate cancer, diabetes, neuropathy, oxygen dependent COPD, and deficiency anemia, debility who was admitted with cellulitis of the left lower extremity, worsening weakness, debility, acute on chronic renal failure.    Discharge Diagnoses:  Active Problems:   Cellulitis   Pressure injury of skin   Cellulitis of the left lower extremity: The erythema, swelling and tenderness has improved.  - wound care consulted.  Afebrile , improving leukocytosis.      Heel ulcer: Slight pus drainage. Pain controlled.  Wound care consulted and recommendations given.    Bilateral lower extremity weakness: Secondary to deconditioning. PT/OT eval, recommending at SNF on discharge.    Acute on Stage 3 CKD Improving.  His creatinine at baseline is around 1.3. Repeat creatinine in one week.     Pedal edema:  Resolved.  Echo with preserved EF.    COPD: No wheezing heard today.  RESUME home medications.  No change in meds.    Diabetes Mellitus:  CBG (last 3)  RecentLabs(last2labs)       Recent Labs    01/02/18 0756 01/02/18 1156 01/02/18 1725  GLUCAP 117* 113* 281*     Resume SSI.  Currently on glipizide. Resume metformin on discharge.  Resume gabapentin on discharge.    Tobacco abuse; ON nicotine patch.      Discharge Instructions  Discharge Instructions    Diet - low sodium heart healthy   Complete by:   As directed    Discharge instructions   Complete by:  As directed    Please follow up with PCP in one week .     Allergies as of 01/03/2018      Reactions   Oxycodone Itching, Other (See Comments)   Only intolerant when takes with Diphenhydramine, altered mental status      Medication List    STOP taking these medications   oxyCODONE-acetaminophen 5-325 MG tablet Commonly known as:  PERCOCET/ROXICET     TAKE these medications   albuterol (2.5 MG/3ML) 0.083% nebulizer solution Commonly known as:  PROVENTIL Take 3 mLs (2.5 mg total) by nebulization every 6 (six) hours.   aspirin 325 MG tablet Take 325 mg by mouth daily.   atorvastatin 10 MG tablet Commonly known as:  LIPITOR Take 10 mg by mouth daily.   budesonide-formoterol 160-4.5 MCG/ACT inhaler Commonly known as:  SYMBICORT Inhale 2 puffs into the lungs 2 (two) times daily.   busPIRone 10 MG tablet Commonly known as:  BUSPAR Take 10 mg by mouth 2 (two) times daily.   cholecalciferol 1000 units tablet Commonly known as:  VITAMIN D Take 1,000 Units by mouth daily.   cyanocobalamin 100 MCG tablet Take 1 tablet (100 mcg total) by mouth daily.   doxycycline 100 MG tablet Commonly known as:  VIBRA-TABS Take 1 tablet (100 mg total) by mouth every 12 (twelve) hours.   ferrous sulfate 325 (65 FE) MG tablet Take 1 tablet (325 mg total) by mouth daily with breakfast. Take with Calcium   finasteride  5 MG tablet Commonly known as:  PROSCAR Take 5 mg by mouth daily.   FLUoxetine 20 MG capsule Commonly known as:  PROZAC Take 60 mg by mouth daily.   furosemide 20 MG tablet Commonly known as:  LASIX Take 20 mg by mouth daily.   gabapentin 300 MG capsule Commonly known as:  NEURONTIN Take 300 mg by mouth at bedtime.   glipiZIDE 5 MG tablet Commonly known as:  GLUCOTROL Take 0.5 tablets (2.5 mg total) by mouth 2 (two) times daily before a meal.   hydroxypropyl methylcellulose / hypromellose 2.5 % ophthalmic  solution Commonly known as:  ISOPTO TEARS / GONIOVISC Place 1 drop into both eyes as needed (for dryness).   metFORMIN 1000 MG tablet Commonly known as:  GLUCOPHAGE Take 500 mg by mouth 2 (two) times daily with a meal.   multivitamin with minerals Tabs tablet Take 1 tablet by mouth daily.   nicotine 7 mg/24hr patch Commonly known as:  NICODERM CQ - dosed in mg/24 hr Place 1 patch (7 mg total) onto the skin daily.   OVER THE COUNTER MEDICATION Apply 1 application topically 2 (two) times daily as needed (RASH). DIAPER/RASH CREAM   predniSONE 20 MG tablet Commonly known as:  DELTASONE Take 1 tablet (20 mg total) by mouth daily with breakfast.   senna-docusate 8.6-50 MG tablet Commonly known as:  Senokot-S Take 1 tablet by mouth daily as needed for mild constipation.   tiotropium 18 MCG inhalation capsule Commonly known as:  SPIRIVA Place 18 mcg into inhaler and inhale daily.       Allergies  Allergen Reactions  . Oxycodone Itching and Other (See Comments)    Only intolerant when takes with Diphenhydramine, altered mental status    Consultations:  None.    Procedures/Studies: Dg Chest 2 View  Result Date: 12/29/2017 CLINICAL DATA:  Chest pain. EXAM: CHEST - 2 VIEW COMPARISON:  Radiographs of December 17, 2017. FINDINGS: Stable cardiomediastinal silhouette. No pneumothorax is noted. Mild bibasilar subsegmental atelectasis is noted. No significant pleural effusion is noted. Bony thorax is unremarkable. IMPRESSION: Mild bibasilar subsegmental atelectasis. Electronically Signed   By: Marijo Conception, M.D.   On: 12/29/2017 14:09   Dg Chest 2 View  Result Date: 12/17/2017 CLINICAL DATA:  Acute onset of cough. EXAM: CHEST - 2 VIEW COMPARISON:  Chest radiograph performed 11/15/2017 FINDINGS: The lungs are well-aerated. Mild bibasilar opacities may reflect atelectasis or possibly mild pneumonia. There is no evidence of pleural effusion or pneumothorax. The heart is borderline  enlarged. No acute osseous abnormalities are seen. IMPRESSION: Mild bibasilar opacities may reflect atelectasis or possibly mild pneumonia. Borderline cardiomegaly. Electronically Signed   By: Garald Balding M.D.   On: 12/17/2017 01:36   Ct Chest Wo Contrast  Result Date: 12/17/2017 CLINICAL DATA:  Chest pain and shortness of Breath EXAM: CT CHEST WITHOUT CONTRAST TECHNIQUE: Multidetector CT imaging of the chest was performed following the standard protocol without IV contrast. COMPARISON:  Chest x-ray from earlier in the same day FINDINGS: Cardiovascular: Thoracic aorta demonstrates atherosclerotic calcifications without significant aneurysmal dilatation. Coronary calcifications are seen. No cardiac enlargement is noted. Mediastinum/Nodes: Thoracic inlet is within normal limits. No hilar or mediastinal adenopathy is seen. The esophagus is within normal limits. Lungs/Pleura: Left lower lobe infiltrate is noted. No associated effusion is seen. No significant parenchymal nodules are noted. Upper Abdomen: Visualized upper abdomen is within normal limits. Musculoskeletal: Biapical pleural and parenchymal scarring is seen. Emphysematous changes are noted. IMPRESSION: Left lower lobe  infiltrate. Aortic Atherosclerosis (ICD10-I70.0) and Emphysema (ICD10-J43.9). Electronically Signed   By: Inez Catalina M.D.   On: 12/17/2017 14:21       Subjective: No new complaints.   Discharge Exam: Vitals:   01/03/18 0413 01/03/18 0733  BP: 138/72   Pulse: 66   Resp: 14   Temp: 98 F (36.7 C)   SpO2: 96% 90%   Vitals:   01/02/18 2009 01/02/18 2100 01/03/18 0413 01/03/18 0733  BP:  (!) 133/56 138/72   Pulse:  71 66   Resp:  16 14   Temp:  98.2 F (36.8 C) 98 F (36.7 C)   TempSrc:  Oral Oral   SpO2: 97% 97% 96% 90%  Weight:   90.7 kg (199 lb 15.3 oz)   Height:        General: Pt is alert, awake, not in acute distress Cardiovascular: RRR, S1/S2 +, no rubs, no gallops Respiratory: CTA bilaterally, no  wheezing, no rhonchi Abdominal: Soft, NT, ND, bowel sounds + Extremities: no edema, no cyanosis, cellulitis improved.     The results of significant diagnostics from this hospitalization (including imaging, microbiology, ancillary and laboratory) are listed below for reference.     Microbiology: Recent Results (from the past 240 hour(s))  Blood Culture (routine x 2)     Status: None (Preliminary result)   Collection Time: 12/29/17  2:47 PM  Result Value Ref Range Status   Specimen Description   Final    BLOOD LEFT HAND Performed at [facility], [place] [address] [place], [place] [address]    Special Requests   Final    BOTTLES DRAWN AEROBIC AND ANAEROBIC Blood Culture adequate volume Performed at [place] [address] [place], [place] [address]    Culture   Final    NO GROWTH 4 DAYS Performed at [facility], [place] [address], [place], [place] [address]    Report Status PENDING  Incomplete  Blood Culture (routine x 2)     Status: None (Preliminary result)   Collection Time: 12/29/17  3:00 PM  Result Value Ref Range Status   Specimen Description   Final    BLOOD RIGHT WRIST Performed at [facility] [address], [place], [place] [address]    Special Requests   Final    BOTTLES DRAWN AEROBIC AND ANAEROBIC Blood Culture adequate volume Performed at [place] [address], [place], [place] [address]    Culture   Final    NO GROWTH 4 DAYS Performed at [facility], [place] [address], [place], [place] [address]    Report Status PENDING  Incomplete     Labs: BNP (last 3 results) Recent Labs    12/17/17 0043 12/29/17 1331  BNP 52.9 818.2*   Basic Metabolic Panel: Recent Labs  Lab 12/29/17 1332 12/30/17 0551 12/31/17 0430 01/01/18 0343  NA 135 138 140 142  K 4.4 3.9 4.0 4.9  CL 91* 100* 102 104  CO2 29 29 28 29   GLUCOSE 193* 133* 135* 151*  BUN 21* 17 18 24*   CREATININE 1.61* 1.40* 1.37* 1.57*  CALCIUM 9.5 8.5* 8.5* 9.0   Liver Function Tests: Recent Labs  Lab 12/29/17 1332  AST 22  ALT 13*  ALKPHOS 82  BILITOT 0.7  PROT 7.4  ALBUMIN 3.1*   No results for input(s): LIPASE, AMYLASE in the last 168 hours. No results for input(s): AMMONIA in the last 168 hours. CBC:  Recent Labs  Lab 12/29/17 1332 12/31/17 0430 01/01/18 0343  WBC 15.5* 12.3* 11.2*  NEUTROABS 12.9* 10.2*  --   HGB 9.2* 8.0* 8.1*  HCT 30.6* 26.4* 27.1*  MCV 81.8 80.0 81.1  PLT 335 289 270   Cardiac Enzymes: No results for input(s): CKTOTAL, CKMB, CKMBINDEX, TROPONINI in the last 168 hours. BNP: Invalid input(s): POCBNP CBG: Recent Labs  Lab 01/02/18 0756 01/02/18 1156 01/02/18 1725 01/02/18 2059 01/03/18 0738  GLUCAP 117* 113* 281* 266* 123*   D-Dimer No results for input(s): DDIMER in the last 72 hours. Hgb A1c No results for input(s): HGBA1C in the last 72 hours. Lipid Profile No results for input(s): CHOL, HDL, LDLCALC, TRIG, CHOLHDL, LDLDIRECT in the last 72 hours. Thyroid function studies No results for input(s): TSH, T4TOTAL, T3FREE, THYROIDAB in the last 72 hours.  Invalid input(s): FREET3 Anemia work up No results for input(s): VITAMINB12, FOLATE, FERRITIN, TIBC, IRON, RETICCTPCT in the last 72 hours. Urinalysis    Component Value Date/Time   COLORURINE YELLOW 12/29/2017 1332   APPEARANCEUR CLEAR 12/29/2017 1332   LABSPEC 1.011 12/29/2017 1332   PHURINE 7.0 12/29/2017 1332   GLUCOSEU NEGATIVE 12/29/2017 1332   HGBUR NEGATIVE 12/29/2017 1332   BILIRUBINUR NEGATIVE 12/29/2017 1332   KETONESUR NEGATIVE 12/29/2017 1332   PROTEINUR NEGATIVE 12/29/2017 1332   UROBILINOGEN 1.0 09/04/2014 2000   NITRITE NEGATIVE 12/29/2017 1332   LEUKOCYTESUR NEGATIVE 12/29/2017 1332   Sepsis Labs Invalid input(s): PROCALCITONIN,  WBC,  LACTICIDVEN Microbiology Recent Results (from the past 240 hour(s))  Blood Culture (routine x 2)     Status: None  (Preliminary result)   Collection Time: 12/29/17  2:47 PM  Result Value Ref Range Status   Specimen Description   Final    BLOOD LEFT HAND Performed at [facility], [place] [address] [place], [place] [address]    Special Requests   Final    BOTTLES DRAWN AEROBIC AND ANAEROBIC Blood Culture adequate volume Performed at [place] [address], [place], [place] [address]    Culture   Final    NO GROWTH 4 DAYS Performed at [facility], [place] [address], [place], [place] [address]    Report Status PENDING  Incomplete  Blood Culture (routine x 2)     Status: None (Preliminary result)   Collection Time: 12/29/17  3:00 PM  Result Value Ref Range Status   Specimen Description   Final    BLOOD RIGHT WRIST Performed at [facility] [address], [place], [place] [address]    Special Requests   Final    BOTTLES DRAWN AEROBIC AND ANAEROBIC Blood Culture adequate volume Performed at [facility] [address] [place], [place] [address]    Culture   Final    NO GROWTH 4 DAYS Performed at [facility], [place] [address] [place], [place] [address]    Report Status PENDING  Incomplete     Time coordinating discharge: Over 30 minutes  SIGNED:   Hosie Poisson, MD  [facility] 01/03/2018, 9:36 AM Pager   If 7PM-7AM, please contact night-coverage [email or web address] Password [email or web address]

## 2018-01-03 NOTE — Progress Notes (Signed)
Per CSW pt wants to go back home with home health. [facility] rep alerted of need for resumption of services at discharge. MD to place home health orders. Marney Doctor RN,BSN,NCM [phone]

## 2018-01-03 NOTE — Progress Notes (Signed)
Physical Therapy Treatment Patient Details Name: KALAI BACA MRN: [id number] DOB: [birth year] Today's Date: 01/03/2018    History of Present Illness 77 yo male admitted with L LE cellulitis. Hx of prostate ca, anemia, neuropathy, COPD-O2, dm, CKD.     PT Comments    Progressing with mobility. Improved mobility and activity tolerance compared to last session. Continue to recommend ST [facility] at [facility].    Follow Up Recommendations  SNF     Equipment Recommendations  (TBD at next venue)    Recommendations for Other Services       Precautions / Restrictions Precautions Precautions: Fall Precaution Comments: O2 dep Restrictions Weight Bearing Restrictions: No    Mobility  Bed Mobility Overal bed mobility: Needs Assistance Bed Mobility: Supine to Sit     Supine to sit: Min guard;HOB elevated     General bed mobility comments: close guard for safety.   Transfers Overall transfer level: Needs assistance Equipment used: Rolling walker (2 wheeled) Transfers: Sit to/from Stand Sit to Stand: Min assist;From elevated surface         General transfer comment: Assist to rise, stabilize, control descent. VCs safety, hand placement.   Ambulation/Gait Ambulation/Gait assistance: Min assist;+2 safety/equipment Ambulation Distance (Feet): 60 Feet Assistive device: Rolling walker (2 wheeled) Gait Pattern/deviations: Step-through pattern;Decreased stride length     General Gait Details: Assist to stabilize throughout ambulation. Remained on [place] O2-sats 96% on 4L. Cues for safety.    Stairs            Wheelchair Mobility    Modified Rankin (Stroke Patients Only)       Balance Overall balance assessment: Needs assistance         Standing balance support: Bilateral upper extremity supported Standing balance-Leahy Scale: Poor                              Cognition Arousal/Alertness: Awake/alert Behavior During Therapy: WFL for tasks  assessed/performed Overall Cognitive Status: Difficult to assess                                 General Comments: appears WFL but then pt will say something that is totally off subject or incorrect      Exercises      General Comments        Pertinent Vitals/Pain Pain Assessment: No/denies pain    Home Living                      Prior Function            PT Goals (current goals can now be found in the care plan section) Progress towards PT goals: Progressing toward goals    Frequency    Min 3X/week      PT Plan Current plan remains appropriate    Co-evaluation              AM-PAC PT "6 Clicks" Daily Activity  Outcome Measure  Difficulty turning over in bed (including adjusting bedclothes, sheets and blankets)?: A Little Difficulty moving from lying on back to sitting on the side of the bed? : A Little Difficulty sitting down on and standing up from a chair with arms (e.g., wheelchair, bedside commode, etc,.)?: Unable Help needed moving to and from a bed to chair (including a wheelchair)?: A Little Help needed walking in hospital room?: A  Little Help needed climbing 3-5 steps with a railing? : A Lot 6 Click Score: 15    End of Session Equipment Utilized During Treatment: Gait belt;Oxygen Activity Tolerance: Patient tolerated treatment well Patient left: in chair;with call bell/phone within reach   PT Visit Diagnosis: Muscle weakness (generalized) (M62.81);Difficulty in walking, not elsewhere classified (R26.2);Unsteadiness on feet (R26.81);Other abnormalities of gait and mobility (R26.89);History of falling (Z91.81)     Time: 2482-5003 PT Time Calculation (min) (ACUTE ONLY): 9 min  Charges:  $Gait Training: 8-22 mins                    G Codes:          Weston Anna, MPT Pager: [phone]

## 2018-01-03 NOTE — Care Management Important Message (Signed)
Important Message  Patient Details  Name: FLAVIO LINDROTH MRN: [id number] Date of Birth: 30-Jul-1941   Medicare Important Message Given:  Yes    Kerin Salen 01/03/2018, 11:52 AMImportant Message  Patient Details  Name: JAMAN ARO MRN: [id number] Date of Birth: 04/15/1941   Medicare Important Message Given:  Yes    Kerin Salen 01/03/2018, 11:52 AM

## 2018-01-03 NOTE — Progress Notes (Addendum)
Provided pt with SNF bed offers. Daughter -in-law visiting to make selection.  Explained [employer] medicare prior authorization requirement and pt/family were understanding, states if auth not granted they doubt pt would want to private pay but they will discuss it together today.  Sharren Bridge, MSW, LCSW Clinical Social Work 01/03/2018 [phone]  Pt's daughter-in-law arrived and CSW met with her and pt at bedside with pt's wife on phone participating as well. Pt reports he has decided that he will not agree to admit to SNF and he and wife are preparing for him to come home. CSW and daughter-in-law discussed the benefits of SNF rehab at length with pt, however pt maintains he will go home. Wants to resume Home Health through [facility] (has had PT/OT/RN since January he reports). States he has BC, walker, wheelchair, shower chair, and adjustable bed at home. CSW and daughter-in-law discussed having pt hire caregivers, [place] will encourage pt to consider this to help wife with his care at home.  Updated attending and CM.

## 2018-01-04 ENCOUNTER — Other Ambulatory Visit: Payer: Self-pay | Admitting: Urology

## 2018-01-04 DIAGNOSIS — C61 Malignant neoplasm of prostate: Secondary | ICD-10-CM

## 2018-01-07 DIAGNOSIS — E785 Hyperlipidemia, unspecified: Secondary | ICD-10-CM | POA: Diagnosis not present

## 2018-01-07 DIAGNOSIS — J449 Chronic obstructive pulmonary disease, unspecified: Secondary | ICD-10-CM | POA: Diagnosis not present

## 2018-01-07 DIAGNOSIS — F329 Major depressive disorder, single episode, unspecified: Secondary | ICD-10-CM | POA: Diagnosis not present

## 2018-01-07 DIAGNOSIS — I129 Hypertensive chronic kidney disease with stage 1 through stage 4 chronic kidney disease, or unspecified chronic kidney disease: Secondary | ICD-10-CM | POA: Diagnosis not present

## 2018-01-07 DIAGNOSIS — F3289 Other specified depressive episodes: Secondary | ICD-10-CM | POA: Diagnosis not present

## 2018-01-07 DIAGNOSIS — Z7951 Long term (current) use of inhaled steroids: Secondary | ICD-10-CM | POA: Diagnosis not present

## 2018-01-07 DIAGNOSIS — E7849 Other hyperlipidemia: Secondary | ICD-10-CM | POA: Diagnosis not present

## 2018-01-07 DIAGNOSIS — Z7984 Long term (current) use of oral hypoglycemic drugs: Secondary | ICD-10-CM | POA: Diagnosis not present

## 2018-01-07 DIAGNOSIS — Z7982 Long term (current) use of aspirin: Secondary | ICD-10-CM | POA: Diagnosis not present

## 2018-01-07 DIAGNOSIS — E1122 Type 2 diabetes mellitus with diabetic chronic kidney disease: Secondary | ICD-10-CM | POA: Diagnosis not present

## 2018-01-07 DIAGNOSIS — N189 Chronic kidney disease, unspecified: Secondary | ICD-10-CM | POA: Diagnosis not present

## 2018-01-09 DIAGNOSIS — Z7951 Long term (current) use of inhaled steroids: Secondary | ICD-10-CM | POA: Diagnosis not present

## 2018-01-09 DIAGNOSIS — Z7982 Long term (current) use of aspirin: Secondary | ICD-10-CM | POA: Diagnosis not present

## 2018-01-09 DIAGNOSIS — E785 Hyperlipidemia, unspecified: Secondary | ICD-10-CM | POA: Diagnosis not present

## 2018-01-09 DIAGNOSIS — Z7984 Long term (current) use of oral hypoglycemic drugs: Secondary | ICD-10-CM | POA: Diagnosis not present

## 2018-01-09 DIAGNOSIS — E1122 Type 2 diabetes mellitus with diabetic chronic kidney disease: Secondary | ICD-10-CM | POA: Diagnosis not present

## 2018-01-09 DIAGNOSIS — I129 Hypertensive chronic kidney disease with stage 1 through stage 4 chronic kidney disease, or unspecified chronic kidney disease: Secondary | ICD-10-CM | POA: Diagnosis not present

## 2018-01-09 DIAGNOSIS — N189 Chronic kidney disease, unspecified: Secondary | ICD-10-CM | POA: Diagnosis not present

## 2018-01-09 DIAGNOSIS — J449 Chronic obstructive pulmonary disease, unspecified: Secondary | ICD-10-CM | POA: Diagnosis not present

## 2018-01-09 DIAGNOSIS — F329 Major depressive disorder, single episode, unspecified: Secondary | ICD-10-CM | POA: Diagnosis not present

## 2018-01-12 DIAGNOSIS — C61 Malignant neoplasm of prostate: Secondary | ICD-10-CM | POA: Diagnosis not present

## 2018-01-12 DIAGNOSIS — I872 Venous insufficiency (chronic) (peripheral): Secondary | ICD-10-CM | POA: Diagnosis not present

## 2018-01-12 DIAGNOSIS — E1122 Type 2 diabetes mellitus with diabetic chronic kidney disease: Secondary | ICD-10-CM | POA: Diagnosis not present

## 2018-01-12 DIAGNOSIS — R269 Unspecified abnormalities of gait and mobility: Secondary | ICD-10-CM | POA: Diagnosis not present

## 2018-01-12 DIAGNOSIS — I10 Essential (primary) hypertension: Secondary | ICD-10-CM | POA: Diagnosis not present

## 2018-01-12 DIAGNOSIS — N183 Chronic kidney disease, stage 3 (moderate): Secondary | ICD-10-CM | POA: Diagnosis not present

## 2018-01-12 DIAGNOSIS — F39 Unspecified mood [affective] disorder: Secondary | ICD-10-CM | POA: Diagnosis not present

## 2018-01-12 DIAGNOSIS — M6289 Other specified disorders of muscle: Secondary | ICD-10-CM | POA: Diagnosis not present

## 2018-01-12 DIAGNOSIS — R82998 Other abnormal findings in urine: Secondary | ICD-10-CM | POA: Diagnosis not present

## 2018-01-12 DIAGNOSIS — Z9981 Dependence on supplemental oxygen: Secondary | ICD-10-CM | POA: Diagnosis not present

## 2018-01-13 DIAGNOSIS — I129 Hypertensive chronic kidney disease with stage 1 through stage 4 chronic kidney disease, or unspecified chronic kidney disease: Secondary | ICD-10-CM | POA: Diagnosis not present

## 2018-01-13 DIAGNOSIS — J449 Chronic obstructive pulmonary disease, unspecified: Secondary | ICD-10-CM | POA: Diagnosis not present

## 2018-01-13 DIAGNOSIS — F329 Major depressive disorder, single episode, unspecified: Secondary | ICD-10-CM | POA: Diagnosis not present

## 2018-01-13 DIAGNOSIS — E785 Hyperlipidemia, unspecified: Secondary | ICD-10-CM | POA: Diagnosis not present

## 2018-01-13 DIAGNOSIS — E1122 Type 2 diabetes mellitus with diabetic chronic kidney disease: Secondary | ICD-10-CM | POA: Diagnosis not present

## 2018-01-13 DIAGNOSIS — Z7984 Long term (current) use of oral hypoglycemic drugs: Secondary | ICD-10-CM | POA: Diagnosis not present

## 2018-01-13 DIAGNOSIS — Z7982 Long term (current) use of aspirin: Secondary | ICD-10-CM | POA: Diagnosis not present

## 2018-01-13 DIAGNOSIS — N189 Chronic kidney disease, unspecified: Secondary | ICD-10-CM | POA: Diagnosis not present

## 2018-01-13 DIAGNOSIS — Z7951 Long term (current) use of inhaled steroids: Secondary | ICD-10-CM | POA: Diagnosis not present

## 2018-01-16 DIAGNOSIS — E1122 Type 2 diabetes mellitus with diabetic chronic kidney disease: Secondary | ICD-10-CM | POA: Diagnosis not present

## 2018-01-16 DIAGNOSIS — I509 Heart failure, unspecified: Secondary | ICD-10-CM | POA: Diagnosis not present

## 2018-01-16 DIAGNOSIS — L89321 Pressure ulcer of left buttock, stage 1: Secondary | ICD-10-CM | POA: Diagnosis not present

## 2018-01-16 DIAGNOSIS — N183 Chronic kidney disease, stage 3 (moderate): Secondary | ICD-10-CM | POA: Diagnosis not present

## 2018-01-16 DIAGNOSIS — L89311 Pressure ulcer of right buttock, stage 1: Secondary | ICD-10-CM | POA: Diagnosis not present

## 2018-01-16 DIAGNOSIS — I130 Hypertensive heart and chronic kidney disease with heart failure and stage 1 through stage 4 chronic kidney disease, or unspecified chronic kidney disease: Secondary | ICD-10-CM | POA: Diagnosis not present

## 2018-01-16 DIAGNOSIS — E1142 Type 2 diabetes mellitus with diabetic polyneuropathy: Secondary | ICD-10-CM | POA: Diagnosis not present

## 2018-01-16 DIAGNOSIS — J449 Chronic obstructive pulmonary disease, unspecified: Secondary | ICD-10-CM | POA: Diagnosis not present

## 2018-01-16 DIAGNOSIS — D508 Other iron deficiency anemias: Secondary | ICD-10-CM | POA: Diagnosis not present

## 2018-01-17 DIAGNOSIS — I130 Hypertensive heart and chronic kidney disease with heart failure and stage 1 through stage 4 chronic kidney disease, or unspecified chronic kidney disease: Secondary | ICD-10-CM | POA: Diagnosis not present

## 2018-01-17 DIAGNOSIS — L89321 Pressure ulcer of left buttock, stage 1: Secondary | ICD-10-CM | POA: Diagnosis not present

## 2018-01-17 DIAGNOSIS — E1122 Type 2 diabetes mellitus with diabetic chronic kidney disease: Secondary | ICD-10-CM | POA: Diagnosis not present

## 2018-01-17 DIAGNOSIS — E1142 Type 2 diabetes mellitus with diabetic polyneuropathy: Secondary | ICD-10-CM | POA: Diagnosis not present

## 2018-01-17 DIAGNOSIS — J449 Chronic obstructive pulmonary disease, unspecified: Secondary | ICD-10-CM | POA: Diagnosis not present

## 2018-01-17 DIAGNOSIS — I509 Heart failure, unspecified: Secondary | ICD-10-CM | POA: Diagnosis not present

## 2018-01-17 DIAGNOSIS — N183 Chronic kidney disease, stage 3 (moderate): Secondary | ICD-10-CM | POA: Diagnosis not present

## 2018-01-17 DIAGNOSIS — L89311 Pressure ulcer of right buttock, stage 1: Secondary | ICD-10-CM | POA: Diagnosis not present

## 2018-01-17 DIAGNOSIS — D509 Iron deficiency anemia, unspecified: Secondary | ICD-10-CM | POA: Diagnosis not present

## 2018-01-19 DIAGNOSIS — I509 Heart failure, unspecified: Secondary | ICD-10-CM | POA: Diagnosis not present

## 2018-01-19 DIAGNOSIS — D509 Iron deficiency anemia, unspecified: Secondary | ICD-10-CM | POA: Diagnosis not present

## 2018-01-19 DIAGNOSIS — L89321 Pressure ulcer of left buttock, stage 1: Secondary | ICD-10-CM | POA: Diagnosis not present

## 2018-01-19 DIAGNOSIS — J449 Chronic obstructive pulmonary disease, unspecified: Secondary | ICD-10-CM | POA: Diagnosis not present

## 2018-01-19 DIAGNOSIS — L89311 Pressure ulcer of right buttock, stage 1: Secondary | ICD-10-CM | POA: Diagnosis not present

## 2018-01-19 DIAGNOSIS — N183 Chronic kidney disease, stage 3 (moderate): Secondary | ICD-10-CM | POA: Diagnosis not present

## 2018-01-19 DIAGNOSIS — I130 Hypertensive heart and chronic kidney disease with heart failure and stage 1 through stage 4 chronic kidney disease, or unspecified chronic kidney disease: Secondary | ICD-10-CM | POA: Diagnosis not present

## 2018-01-19 DIAGNOSIS — E1142 Type 2 diabetes mellitus with diabetic polyneuropathy: Secondary | ICD-10-CM | POA: Diagnosis not present

## 2018-01-19 DIAGNOSIS — E1122 Type 2 diabetes mellitus with diabetic chronic kidney disease: Secondary | ICD-10-CM | POA: Diagnosis not present

## 2018-01-20 DIAGNOSIS — R0902 Hypoxemia: Secondary | ICD-10-CM | POA: Diagnosis not present

## 2018-01-20 DIAGNOSIS — J449 Chronic obstructive pulmonary disease, unspecified: Secondary | ICD-10-CM | POA: Diagnosis not present

## 2018-01-21 DIAGNOSIS — R609 Edema, unspecified: Secondary | ICD-10-CM | POA: Diagnosis not present

## 2018-01-21 DIAGNOSIS — F419 Anxiety disorder, unspecified: Secondary | ICD-10-CM | POA: Diagnosis not present

## 2018-01-21 DIAGNOSIS — M21372 Foot drop, left foot: Secondary | ICD-10-CM | POA: Diagnosis not present

## 2018-01-21 DIAGNOSIS — N183 Chronic kidney disease, stage 3 (moderate): Secondary | ICD-10-CM | POA: Diagnosis not present

## 2018-01-21 DIAGNOSIS — Z79899 Other long term (current) drug therapy: Secondary | ICD-10-CM | POA: Diagnosis not present

## 2018-01-21 DIAGNOSIS — E1142 Type 2 diabetes mellitus with diabetic polyneuropathy: Secondary | ICD-10-CM | POA: Diagnosis not present

## 2018-01-21 DIAGNOSIS — E162 Hypoglycemia, unspecified: Secondary | ICD-10-CM | POA: Diagnosis not present

## 2018-01-21 DIAGNOSIS — I509 Heart failure, unspecified: Secondary | ICD-10-CM | POA: Diagnosis not present

## 2018-01-21 DIAGNOSIS — I10 Essential (primary) hypertension: Secondary | ICD-10-CM | POA: Diagnosis not present

## 2018-01-21 DIAGNOSIS — J449 Chronic obstructive pulmonary disease, unspecified: Secondary | ICD-10-CM | POA: Diagnosis not present

## 2018-01-21 DIAGNOSIS — R69 Illness, unspecified: Secondary | ICD-10-CM | POA: Diagnosis not present

## 2018-01-21 DIAGNOSIS — G629 Polyneuropathy, unspecified: Secondary | ICD-10-CM | POA: Diagnosis not present

## 2018-01-21 DIAGNOSIS — E782 Mixed hyperlipidemia: Secondary | ICD-10-CM | POA: Diagnosis not present

## 2018-01-21 DIAGNOSIS — C61 Malignant neoplasm of prostate: Secondary | ICD-10-CM | POA: Diagnosis not present

## 2018-01-21 DIAGNOSIS — D509 Iron deficiency anemia, unspecified: Secondary | ICD-10-CM | POA: Diagnosis not present

## 2018-01-21 DIAGNOSIS — J431 Panlobular emphysema: Secondary | ICD-10-CM | POA: Diagnosis not present

## 2018-01-21 DIAGNOSIS — Z9981 Dependence on supplemental oxygen: Secondary | ICD-10-CM | POA: Diagnosis not present

## 2018-01-21 DIAGNOSIS — E8809 Other disorders of plasma-protein metabolism, not elsewhere classified: Secondary | ICD-10-CM | POA: Diagnosis not present

## 2018-01-21 DIAGNOSIS — E119 Type 2 diabetes mellitus without complications: Secondary | ICD-10-CM | POA: Diagnosis not present

## 2018-01-21 DIAGNOSIS — G4700 Insomnia, unspecified: Secondary | ICD-10-CM | POA: Diagnosis not present

## 2018-01-21 DIAGNOSIS — D649 Anemia, unspecified: Secondary | ICD-10-CM | POA: Diagnosis not present

## 2018-01-24 DIAGNOSIS — D649 Anemia, unspecified: Secondary | ICD-10-CM | POA: Diagnosis not present

## 2018-01-24 DIAGNOSIS — J431 Panlobular emphysema: Secondary | ICD-10-CM | POA: Diagnosis not present

## 2018-01-24 DIAGNOSIS — I10 Essential (primary) hypertension: Secondary | ICD-10-CM | POA: Diagnosis not present

## 2018-01-24 DIAGNOSIS — E1142 Type 2 diabetes mellitus with diabetic polyneuropathy: Secondary | ICD-10-CM | POA: Diagnosis not present

## 2018-01-25 DIAGNOSIS — E782 Mixed hyperlipidemia: Secondary | ICD-10-CM | POA: Diagnosis not present

## 2018-01-25 DIAGNOSIS — J431 Panlobular emphysema: Secondary | ICD-10-CM | POA: Diagnosis not present

## 2018-01-25 DIAGNOSIS — I10 Essential (primary) hypertension: Secondary | ICD-10-CM | POA: Diagnosis not present

## 2018-01-25 DIAGNOSIS — N183 Chronic kidney disease, stage 3 (moderate): Secondary | ICD-10-CM | POA: Diagnosis not present

## 2018-01-28 DIAGNOSIS — E1142 Type 2 diabetes mellitus with diabetic polyneuropathy: Secondary | ICD-10-CM | POA: Diagnosis not present

## 2018-01-28 DIAGNOSIS — D649 Anemia, unspecified: Secondary | ICD-10-CM | POA: Diagnosis not present

## 2018-01-28 DIAGNOSIS — R609 Edema, unspecified: Secondary | ICD-10-CM | POA: Diagnosis not present

## 2018-01-28 DIAGNOSIS — E8809 Other disorders of plasma-protein metabolism, not elsewhere classified: Secondary | ICD-10-CM | POA: Diagnosis not present

## 2018-01-31 DIAGNOSIS — F419 Anxiety disorder, unspecified: Secondary | ICD-10-CM | POA: Diagnosis not present

## 2018-01-31 DIAGNOSIS — D649 Anemia, unspecified: Secondary | ICD-10-CM | POA: Diagnosis not present

## 2018-01-31 DIAGNOSIS — I10 Essential (primary) hypertension: Secondary | ICD-10-CM | POA: Diagnosis not present

## 2018-01-31 DIAGNOSIS — G4700 Insomnia, unspecified: Secondary | ICD-10-CM | POA: Diagnosis not present

## 2018-02-02 DIAGNOSIS — C61 Malignant neoplasm of prostate: Secondary | ICD-10-CM | POA: Diagnosis not present

## 2018-02-02 DIAGNOSIS — J449 Chronic obstructive pulmonary disease, unspecified: Secondary | ICD-10-CM | POA: Diagnosis not present

## 2018-02-02 DIAGNOSIS — I509 Heart failure, unspecified: Secondary | ICD-10-CM | POA: Diagnosis not present

## 2018-02-02 DIAGNOSIS — R69 Illness, unspecified: Secondary | ICD-10-CM | POA: Diagnosis not present

## 2018-02-02 DIAGNOSIS — D509 Iron deficiency anemia, unspecified: Secondary | ICD-10-CM | POA: Diagnosis not present

## 2018-02-02 DIAGNOSIS — G629 Polyneuropathy, unspecified: Secondary | ICD-10-CM | POA: Diagnosis not present

## 2018-02-02 DIAGNOSIS — M6281 Muscle weakness (generalized): Secondary | ICD-10-CM | POA: Diagnosis not present

## 2018-02-02 DIAGNOSIS — Z9981 Dependence on supplemental oxygen: Secondary | ICD-10-CM | POA: Diagnosis not present

## 2018-02-02 DIAGNOSIS — G4700 Insomnia, unspecified: Secondary | ICD-10-CM | POA: Diagnosis not present

## 2018-02-02 DIAGNOSIS — E119 Type 2 diabetes mellitus without complications: Secondary | ICD-10-CM | POA: Diagnosis not present

## 2018-02-02 DIAGNOSIS — D649 Anemia, unspecified: Secondary | ICD-10-CM | POA: Diagnosis not present

## 2018-02-09 ENCOUNTER — Other Ambulatory Visit: Payer: Self-pay

## 2018-02-09 DIAGNOSIS — I10 Essential (primary) hypertension: Secondary | ICD-10-CM | POA: Diagnosis not present

## 2018-02-09 DIAGNOSIS — J449 Chronic obstructive pulmonary disease, unspecified: Secondary | ICD-10-CM | POA: Diagnosis not present

## 2018-02-09 DIAGNOSIS — Z6829 Body mass index (BMI) 29.0-29.9, adult: Secondary | ICD-10-CM | POA: Diagnosis not present

## 2018-02-09 DIAGNOSIS — E1122 Type 2 diabetes mellitus with diabetic chronic kidney disease: Secondary | ICD-10-CM | POA: Diagnosis not present

## 2018-02-09 DIAGNOSIS — M6289 Other specified disorders of muscle: Secondary | ICD-10-CM | POA: Diagnosis not present

## 2018-02-09 NOTE — Patient Outreach (Signed)
[facility]) Care Management  02/09/2018  [name] [birth year] [id number]   [employer] Referral received on 02/09/18: patient discharged from the [facility] on 02/07/18. RNCM called for transition of care. Two patient identifiers confirmed. Client referred transition of care call to his daughter in [name], [name] [phone]).  77 year old with history of heart failure, COPD, diabetes, HTN, CKD, fall, prostate cancer, depression.  [name] states client was hospitalized for heart failure exacerbation and went to rehabilitation approximately two week. She states client has scales and weighs himself. She states he continues to have lower extremity edema. Client also has COPD and uses nebulizer machine.  Client discharged back to his home in [place]. Client's wife left two weeks ago and moved back in with her family in [name]. But his daughter-in-law and son come to his home to assist him and provide around the clock care for him.   Client daughter in law states client had a follow up appointment with primary care provider today. No changes were made to his medications. She reports home health agency [facility] has contacted them and will start care on tomorrow.  Medications reviewed: She reports visit with primary care provider today and no questions at this time.  Need: personal care assistance resources  Plan: community care coordination to continue transition of care and possible heart failure and COPD care coordination/education for action plan; social work regarding resources for personal care, possible adult day care program.  Thea Silversmith, RN, MSN, [place] Coordinator Cell: [phone]

## 2018-02-10 ENCOUNTER — Other Ambulatory Visit: Payer: Self-pay

## 2018-02-10 DIAGNOSIS — L89311 Pressure ulcer of right buttock, stage 1: Secondary | ICD-10-CM | POA: Diagnosis not present

## 2018-02-10 DIAGNOSIS — D509 Iron deficiency anemia, unspecified: Secondary | ICD-10-CM | POA: Diagnosis not present

## 2018-02-10 DIAGNOSIS — E1122 Type 2 diabetes mellitus with diabetic chronic kidney disease: Secondary | ICD-10-CM | POA: Diagnosis not present

## 2018-02-10 DIAGNOSIS — I130 Hypertensive heart and chronic kidney disease with heart failure and stage 1 through stage 4 chronic kidney disease, or unspecified chronic kidney disease: Secondary | ICD-10-CM | POA: Diagnosis not present

## 2018-02-10 DIAGNOSIS — E1142 Type 2 diabetes mellitus with diabetic polyneuropathy: Secondary | ICD-10-CM | POA: Diagnosis not present

## 2018-02-10 DIAGNOSIS — N183 Chronic kidney disease, stage 3 (moderate): Secondary | ICD-10-CM | POA: Diagnosis not present

## 2018-02-10 DIAGNOSIS — I509 Heart failure, unspecified: Secondary | ICD-10-CM | POA: Diagnosis not present

## 2018-02-10 DIAGNOSIS — J449 Chronic obstructive pulmonary disease, unspecified: Secondary | ICD-10-CM | POA: Diagnosis not present

## 2018-02-10 DIAGNOSIS — L89321 Pressure ulcer of left buttock, stage 1: Secondary | ICD-10-CM | POA: Diagnosis not present

## 2018-02-10 NOTE — Patient Outreach (Signed)
New referral received today: Week 1 Transition of care completed by telephonic RN. Today's weight of 197 pounds  Placed call to contact person Donel Osowski [phone] ( daughter in law) who reports that patient is doing fairly well. Mrs. Moreland states that she and her husband were informed on Friday 02/04/2018 that patient would be discharge home on 02/07/2018 ( Monday).  Daughter in law reports patient's wife of 7 years left with her daughter and moved back to [place]. Reports no one was prepared for this and now patient needs caregiver.  Daughter in law reports she has stayed with patient 24/7 all week and she is trying to come up with a plan. Reports [facility] came to see patient this week but he is not in agreement to go.  Daughter in law reports home health nurse saw patient today and put on compression stocking to help with swelling. [facility], PT, OT and ?SW.   PLAN: reviewed [facility] program and daughter in law has accepted a home visit for 02/14/2018.  Collaborated with [facility] social worker about pending home visit for joint visit if possible.  Confirmed address with daughter in law.  Will see patient for assessment on 02/14/2018. Care plan to be established during home visit with patient and family.  Tomasa Rand, RN, BSN, CEN [facility] [employer] [phone]

## 2018-02-11 ENCOUNTER — Encounter: Payer: Self-pay | Admitting: *Deleted

## 2018-02-11 ENCOUNTER — Other Ambulatory Visit: Payer: Self-pay | Admitting: *Deleted

## 2018-02-11 DIAGNOSIS — J449 Chronic obstructive pulmonary disease, unspecified: Secondary | ICD-10-CM | POA: Diagnosis not present

## 2018-02-11 DIAGNOSIS — E1122 Type 2 diabetes mellitus with diabetic chronic kidney disease: Secondary | ICD-10-CM | POA: Diagnosis not present

## 2018-02-11 DIAGNOSIS — I509 Heart failure, unspecified: Secondary | ICD-10-CM | POA: Diagnosis not present

## 2018-02-11 DIAGNOSIS — L89311 Pressure ulcer of right buttock, stage 1: Secondary | ICD-10-CM | POA: Diagnosis not present

## 2018-02-11 DIAGNOSIS — D509 Iron deficiency anemia, unspecified: Secondary | ICD-10-CM | POA: Diagnosis not present

## 2018-02-11 DIAGNOSIS — L89321 Pressure ulcer of left buttock, stage 1: Secondary | ICD-10-CM | POA: Diagnosis not present

## 2018-02-11 DIAGNOSIS — I130 Hypertensive heart and chronic kidney disease with heart failure and stage 1 through stage 4 chronic kidney disease, or unspecified chronic kidney disease: Secondary | ICD-10-CM | POA: Diagnosis not present

## 2018-02-11 DIAGNOSIS — N183 Chronic kidney disease, stage 3 (moderate): Secondary | ICD-10-CM | POA: Diagnosis not present

## 2018-02-11 DIAGNOSIS — E1142 Type 2 diabetes mellitus with diabetic polyneuropathy: Secondary | ICD-10-CM | POA: Diagnosis not present

## 2018-02-11 NOTE — Patient Outreach (Signed)
[facility] [facility]) Care Management  02/11/2018  [name] [name] 02-20-1941 [id number]   CSW was able to make initial contact with patient and patient's daughter-in-law, [name] [name] today to perform the phone assessment on patient, as well as assess and assist with social work needs and services.  CSW introduced self, explained role and types of services provided through [facility] ([facility]).  CSW further explained to patient and Mrs. Moder that CSW works with patient's RNCM, also with [facility], Tomasa Rand. CSW then explained the reason for the call, indicating that Mrs. Lacinda Axon thought that patient would benefit from social work services and resources to assist with arranging for 24 hour care and supervision in the home.  CSW obtained two HIPAA compliant identifiers from patient, which included patient's name and date of birth. In talking with Mrs. Sherk, she reports that she is having to step in and provide 24 hour care and supervision to patient, as no other family members are available at the present time.  Mrs. Rodriquez reported that she has made arrangements to stay with patient until they are able to get someone in place that is able to stay with patient full-time.  Mrs. Bolds stated that they are interviewing a candidate this weekend and if all goes well, the individual will be able to start on Tuesday, May 14th.  CSW inquired as to how CSW could be of assistance to patient and Mrs. Dunlevy at this time?  Mrs. Buchmann denied being able to identify any social work specific needs at present, reporting that she believes that they have been able to "work things out".  CSW provided Mrs. Daughenbaugh with the following website:  [employer].br, for future reference, or if her current resource falls through. CSW provided patient and Mrs. Harron with CSW's contact information, encouraging them to contact CSW directly if additional social work needs  arise in the near future or if patient's current home care provider plans do not work out.  Patient and Mrs. Rotert voiced understanding and were agreeable to this plan.  CSW agreed to follow-up with Mrs. Haroon on Wednesday, Feb 16, 2018 to ensure that patient has 24 hour care and supervision in place through a community agency of their choice.  CSW will report findings to Mrs. Lacinda Axon so that she is aware of patient's current plan of care. Nat Christen, BSW, MSW, LCSW  Licensed Education officer, environmental Health System  Mailing [place] [address], [place], [place] [address] Physical Address-[address], [place], [place] [address] Toll Free Main # [phone] Fax # [phone] Cell # [phone]  Office # [phone] [email or web address]

## 2018-02-14 ENCOUNTER — Ambulatory Visit: Payer: Self-pay

## 2018-02-14 ENCOUNTER — Other Ambulatory Visit: Payer: Self-pay

## 2018-02-14 DIAGNOSIS — L89311 Pressure ulcer of right buttock, stage 1: Secondary | ICD-10-CM | POA: Diagnosis not present

## 2018-02-14 DIAGNOSIS — J449 Chronic obstructive pulmonary disease, unspecified: Secondary | ICD-10-CM | POA: Diagnosis not present

## 2018-02-14 DIAGNOSIS — N183 Chronic kidney disease, stage 3 (moderate): Secondary | ICD-10-CM | POA: Diagnosis not present

## 2018-02-14 DIAGNOSIS — D509 Iron deficiency anemia, unspecified: Secondary | ICD-10-CM | POA: Diagnosis not present

## 2018-02-14 DIAGNOSIS — I509 Heart failure, unspecified: Secondary | ICD-10-CM | POA: Diagnosis not present

## 2018-02-14 DIAGNOSIS — E1122 Type 2 diabetes mellitus with diabetic chronic kidney disease: Secondary | ICD-10-CM | POA: Diagnosis not present

## 2018-02-14 DIAGNOSIS — L89321 Pressure ulcer of left buttock, stage 1: Secondary | ICD-10-CM | POA: Diagnosis not present

## 2018-02-14 DIAGNOSIS — E1142 Type 2 diabetes mellitus with diabetic polyneuropathy: Secondary | ICD-10-CM | POA: Diagnosis not present

## 2018-02-14 DIAGNOSIS — I130 Hypertensive heart and chronic kidney disease with heart failure and stage 1 through stage 4 chronic kidney disease, or unspecified chronic kidney disease: Secondary | ICD-10-CM | POA: Diagnosis not present

## 2018-02-14 NOTE — Patient Outreach (Signed)
[place] [facility]) Care Management   02/14/2018  [name] [name] Jun 10, 1941 [id number]  [name] [name] is an 77 y.o. male [name] [name] present and actively engaged during home visit. Subjective: Reports biggest concern is weakness.  Patient was discharged from [facility] on 02/07/2018.  Home alone at this time. Daughter in law has been living with patient since discharged home. Daughter in law reports patient is getting stronger every day.  Daughter in law and son are interviewing a caregiver on 02/15/2018 to stay with patient during the day and a family member would stay with patient at night.  Patient believes he will eventually be able to stay home alone.  Daughter in law is managing medications and meals.  Reports patient has never lived alone. Patient is active with [facility]. Nurse arrived during this home visit. Patient has telehealth for weights and blood pressure monitoring daily.  Patient use to sleep in recliner but is now sleeping in an adjustable bed. Patient reports that he is sleeping well.  Reports range of CBG of 140-225.  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  During this time patient gets moody and anxious. Patient reports long term plan is to stay in his home.  Unknown if wife will return from [place].   Objective:  Awake and alert. Confused with orientation questions. Wearing oxygen at 4 liters nasal canula.  Today's Vitals   02/14/18 1003 02/14/18 1008  BP: 128/60   Pulse: 90   Resp: 20   SpO2: 96%   Weight: 197 lb (89.4 kg)   Height: 1.829 m (6')   PainSc:  0-No pain   Review of Systems  Constitutional: Positive for malaise/fatigue.  HENT: Negative.   Eyes:       Has glasses but does not wear them  Respiratory: Positive for shortness of breath.        Shortness of breath with ambulation.   Cardiovascular: Positive for leg swelling.  Gastrointestinal: Negative.   Genitourinary: Negative.    Musculoskeletal: Negative.   Skin: Positive for itching (reports itchy skin all over).       Reports recent bedsore for 2 months.   Neurological: Negative.   Endo/Heme/Allergies: Negative.   Psychiatric/Behavioral: Positive for depression and memory loss.       Reports being moody when to sun goes down    Physical Exam  Constitutional: He appears well-developed and well-nourished.  Cardiovascular: Normal rate, regular rhythm, normal heart sounds and intact distal pulses.  Respiratory: Effort normal and breath sounds normal.  GI: Soft. Bowel sounds are normal.  Musculoskeletal: Normal range of motion. He exhibits edema.  Neurological:  Confused about president, year.   Skin: Skin is warm and dry.  Reddened area to buttocks.   Psychiatric: He has a normal mood and affect. His behavior is normal. Judgment and thought content normal.    Encounter Medications:   Outpatient Encounter Medications as of 02/14/2018  Medication Sig Note  . albuterol (PROVENTIL) (2.5 MG/3ML) 0.083% nebulizer solution Take 3 mLs (2.5 mg total) by nebulization every 6 (six) hours.   Marland Kitchen aspirin 325 MG tablet Take 325 mg by mouth daily.   Marland Kitchen atorvastatin (LIPITOR) 10 MG tablet Take 10 mg by mouth daily.   . budesonide-formoterol (SYMBICORT) 160-4.5 MCG/ACT inhaler Inhale 2 puffs into the lungs 2 (two) times daily.   . cholecalciferol (VITAMIN D) 1000 UNITS tablet Take 1,000 Units by mouth daily.   . cyanocobalamin 100  MCG tablet Take 1 tablet (100 mcg total) by mouth daily. 02/09/2018: Takes 500 mcg daily  . ferrous sulfate 325 (65 FE) MG tablet Take 1 tablet (325 mg total) by mouth daily with breakfast. Take with Calcium   . FLUoxetine (PROZAC) 20 MG capsule Take 60 mg by mouth daily.    . Fluticasone Furoate-Vilanterol (BREO ELLIPTA IN) Inhale 1 Inhaler into the lungs daily.   . fluticasone furoate-vilanterol (BREO ELLIPTA) 100-25 MCG/INH AEPB Inhale 1 puff into the lungs daily.   . furosemide (LASIX) 20 MG  tablet Take 20 mg by mouth daily.   Marland Kitchen gabapentin (NEURONTIN) 300 MG capsule Take 300 mg by mouth at bedtime.   . hydroxypropyl methylcellulose (ISOPTO TEARS) 2.5 % ophthalmic solution Place 1 drop into both eyes as needed (for dryness).    . metFORMIN (GLUCOPHAGE) 1000 MG tablet Take 1,000 mg by mouth 2 (two) times daily with a meal.    . Multiple Vitamin (MULTIVITAMIN WITH MINERALS) TABS tablet Take 1 tablet by mouth daily.   Marland Kitchen senna-docusate (SENOKOT-S) 8.6-50 MG tablet Take 1 tablet by mouth daily as needed for mild constipation.   Marland Kitchen tiotropium (SPIRIVA) 18 MCG inhalation capsule Place 18 mcg into inhaler and inhale daily.   . vitamin C (ASCORBIC ACID) 500 MG tablet Take 500 mg by mouth daily.   . busPIRone (BUSPAR) 10 MG tablet Take 10 mg by mouth 2 (two) times daily.   Marland Kitchen doxycycline (VIBRA-TABS) 100 MG tablet Take 1 tablet (100 mg total) by mouth every 12 (twelve) hours. (Patient not taking: Reported on 02/09/2018)   . finasteride (PROSCAR) 5 MG tablet Take 5 mg by mouth daily.   Marland Kitchen glipiZIDE (GLUCOTROL) 5 MG tablet Take 0.5 tablets (2.5 mg total) by mouth 2 (two) times daily before a meal. (Patient not taking: Reported on 02/09/2018)   . nicotine (NICODERM CQ - DOSED IN MG/24 HR) 7 mg/24hr patch Place 1 patch (7 mg total) onto the skin daily. (Patient not taking: Reported on 02/09/2018)   . OVER THE COUNTER MEDICATION Apply 1 application topically 2 (two) times daily as needed (RASH). DIAPER/RASH CREAM   . predniSONE (DELTASONE) 20 MG tablet Take 1 tablet (20 mg total) by mouth daily with breakfast. (Patient not taking: Reported on 02/09/2018)    No facility-administered encounter medications on file as of 02/14/2018.     Functional Status:   In your present state of health, do you have any difficulty performing the following activities: 02/14/2018 02/11/2018  Hearing? N N  Vision? N Y  Difficulty concentrating or making decisions? [name] [name]  Walking or climbing stairs? Y Y  Dressing or bathing? N Y   Doing errands, shopping? [name] [name]  Preparing Food and eating ? [name] [name]  Comment family prepares food -  Using the Toilet? N Y  In the past six months, have you accidently leaked urine? Y Y  Do you have problems with loss of bowel control? N N  Managing your Medications? [name] [name]  Comment daughter in law manages medication -  Managing your Finances? N Y  Housekeeping or managing your Housekeeping? Y Y  Some recent data might be hidden    Fall/Depression Screening:    Fall Risk  02/14/2018 02/11/2018  Falls in the past year? Yes Yes  Number falls in past yr: 2 or more 1  Injury with Fall? No No  Risk Factor Category  High Fall Risk -  Risk for fall due to : History of fall(s) Impaired balance/gait;Impaired mobility  Follow up Falls evaluation completed;Falls prevention discussed Education provided;Falls prevention discussed   PHQ 2/9 Scores 02/14/2018 02/11/2018  PHQ - 2 Score 0 0    Assessment:  (1) reviewed [facility] program. Provided new patient packet, [facility] calendar and my contact card. Reviewed [facility] consent and obtained consent. (2) active with [facility] for RN, PT and OT, Tele health monitoring (3) CBG range of 140- 225. Today's reading of 187 (4) frequent falls. (5) COPD/HF: oxygen dependent (6) daughter in law manages medications and patient is taking as prescribed. (7) Currently family is staying with patient 24/7.  Interview for caregiver planned for 02/15/2018 (8) recent bedsore to buttocks  Plan: (1) consent scanned into chart. Patient is active in the transition of care program and will be outreached weekly. Reviewed pending MD appointments in Epic (2) discussed concern with home health nurse about PT starting. She reports she will follow up. OT planned for 02/16/2018 (3) reviewed importance of daily monitoring and recording (4) reviewed fall precautions with patient and daughter in law. Reviewed non slip socks. (5)Reviewed portable oxygen and patients ability to manage well with  oxygen.  Reviewed no smoking with oxygen. Encouraged patient to use his inhalers and take all medications as prescribed. (6) encouraged patient to take all medications as prescribed. Reviewed mail order process with [employer] with patient and daughter. Reviewed how to get set up by call customer service line. Daughter in law voiced understanding. (7)Encouraged daughter in law to get some breaks to avoid burnout. Reviewed importance of daily care with altered memory. Patient has refused stay well.  Patient wants to remains in his home.  (8) Buttocks healed. Red scar noted but no broken tissue. Reviewed with patient the importance of pressure release, good personal hygiene and frequent walks.  Care planning and goal setting during home visit and primary goal for patient is to remain at home.   This note and barrier letter sent to MD. Next follow up in 1 week. Patient requested his daughter in law be called weekly instead of him.  THN CM Care Plan Problem One     Most Recent Value  Care Plan Problem One  Recent admission for dehydration and pneumonia as reported by patient and daughter in law.   Role Documenting the Problem One  Care Management [place] for Problem One  Active  THN Long Term Goal   Patient and or family will report no readmissions to the hospital for the next 90 day for COPD or Heart failure.   THN Long Term Goal Start Date  02/14/18  Interventions for Problem One Long Term Goal  Home visit completed, Reviewed medications and importnace of follow up. Reviewed home health with patient and daughter. Discussed care with home health nurse.   THN CM Short Term Goal #1   Patient will reports self monitoring weights and CBG daily for the next 30 days.   [facility]N CM Short Term Goal #1 Start Date  02/14/18  Interventions for Short Term Goal #1  Reviewed importance of daily monitoring and recording of weights. Reviewed COPD and CHF zones and when to call MD. Reviewed all medications.    THN CM Short Term Goal #2   Patient will report no falls in the next 30 days.   THN CM Short Term Goal #2 Start Date  02/14/18  Interventions for Short Term Goal #2  Reviewed with patient the importance of safe daily activity. Reviewed home health PT and OT plans. Encouraged patient  to do home exercises. Reviewed fall precautions and use of non slip socks.      Tomasa Rand, RN, BSN, CEN [facility] [employer] [phone]

## 2018-02-15 DIAGNOSIS — J449 Chronic obstructive pulmonary disease, unspecified: Secondary | ICD-10-CM | POA: Diagnosis not present

## 2018-02-15 DIAGNOSIS — I509 Heart failure, unspecified: Secondary | ICD-10-CM | POA: Diagnosis not present

## 2018-02-15 DIAGNOSIS — N183 Chronic kidney disease, stage 3 (moderate): Secondary | ICD-10-CM | POA: Diagnosis not present

## 2018-02-15 DIAGNOSIS — I130 Hypertensive heart and chronic kidney disease with heart failure and stage 1 through stage 4 chronic kidney disease, or unspecified chronic kidney disease: Secondary | ICD-10-CM | POA: Diagnosis not present

## 2018-02-15 DIAGNOSIS — L89321 Pressure ulcer of left buttock, stage 1: Secondary | ICD-10-CM | POA: Diagnosis not present

## 2018-02-15 DIAGNOSIS — E1142 Type 2 diabetes mellitus with diabetic polyneuropathy: Secondary | ICD-10-CM | POA: Diagnosis not present

## 2018-02-15 DIAGNOSIS — D509 Iron deficiency anemia, unspecified: Secondary | ICD-10-CM | POA: Diagnosis not present

## 2018-02-15 DIAGNOSIS — L89311 Pressure ulcer of right buttock, stage 1: Secondary | ICD-10-CM | POA: Diagnosis not present

## 2018-02-15 DIAGNOSIS — E1122 Type 2 diabetes mellitus with diabetic chronic kidney disease: Secondary | ICD-10-CM | POA: Diagnosis not present

## 2018-02-16 ENCOUNTER — Other Ambulatory Visit: Payer: Self-pay | Admitting: *Deleted

## 2018-02-16 DIAGNOSIS — I509 Heart failure, unspecified: Secondary | ICD-10-CM | POA: Diagnosis not present

## 2018-02-16 DIAGNOSIS — L89311 Pressure ulcer of right buttock, stage 1: Secondary | ICD-10-CM | POA: Diagnosis not present

## 2018-02-16 DIAGNOSIS — N183 Chronic kidney disease, stage 3 (moderate): Secondary | ICD-10-CM | POA: Diagnosis not present

## 2018-02-16 DIAGNOSIS — E1142 Type 2 diabetes mellitus with diabetic polyneuropathy: Secondary | ICD-10-CM | POA: Diagnosis not present

## 2018-02-16 DIAGNOSIS — J449 Chronic obstructive pulmonary disease, unspecified: Secondary | ICD-10-CM | POA: Diagnosis not present

## 2018-02-16 DIAGNOSIS — E1122 Type 2 diabetes mellitus with diabetic chronic kidney disease: Secondary | ICD-10-CM | POA: Diagnosis not present

## 2018-02-16 DIAGNOSIS — L89321 Pressure ulcer of left buttock, stage 1: Secondary | ICD-10-CM | POA: Diagnosis not present

## 2018-02-16 DIAGNOSIS — I130 Hypertensive heart and chronic kidney disease with heart failure and stage 1 through stage 4 chronic kidney disease, or unspecified chronic kidney disease: Secondary | ICD-10-CM | POA: Diagnosis not present

## 2018-02-16 DIAGNOSIS — D509 Iron deficiency anemia, unspecified: Secondary | ICD-10-CM | POA: Diagnosis not present

## 2018-02-16 NOTE — Patient Outreach (Signed)
[facility]) Care Management  02/16/2018  JAYKE CAUL 10-Dec-1940 [id number]   CSW was able to make contact with patient's daughter-in-law, Bethany Cumming today to follow-up regarding in-home care services for patient.  Mrs. Bonn reports that she has been able to hire a lady that was a former CNA Optometrist) to provide 24 hour care and supervision to patient.  Mrs. Landi further reported that this individual is ready to start whenever Mrs. Servais contacts her, which will probably be within the next week.  At present, Mrs. Brouwer reported that she, her husband and her sister-in-law are currently staying with patient to provide in-home care services around the clock.   CSW will perform a case closure on patient, as all goals of treatment have been met from social work standpoint and no additional social work needs have been identified at this time.  CSW will notify patient's RNCM with [facility], Tomasa Rand of CSW's plans to close patient's case.  CSW will fax an update to patient's Primary Care Physician, Dr. Prince Solian to ensure that they are aware of CSW's involvement with patient's plan of care.   Nat Christen, BSW, MSW, LCSW  Licensed Education officer, environmental Health System  Mailing [place] [address], [place], [place] [address] Physical Address-[address], [place],  [address] Toll Free Main # [phone] Fax # [phone] Cell # [phone]  Office # [phone] [email or web address]

## 2018-02-17 DIAGNOSIS — L89311 Pressure ulcer of right buttock, stage 1: Secondary | ICD-10-CM | POA: Diagnosis not present

## 2018-02-17 DIAGNOSIS — E1122 Type 2 diabetes mellitus with diabetic chronic kidney disease: Secondary | ICD-10-CM | POA: Diagnosis not present

## 2018-02-17 DIAGNOSIS — D509 Iron deficiency anemia, unspecified: Secondary | ICD-10-CM | POA: Diagnosis not present

## 2018-02-17 DIAGNOSIS — J449 Chronic obstructive pulmonary disease, unspecified: Secondary | ICD-10-CM | POA: Diagnosis not present

## 2018-02-17 DIAGNOSIS — E1142 Type 2 diabetes mellitus with diabetic polyneuropathy: Secondary | ICD-10-CM | POA: Diagnosis not present

## 2018-02-17 DIAGNOSIS — N183 Chronic kidney disease, stage 3 (moderate): Secondary | ICD-10-CM | POA: Diagnosis not present

## 2018-02-17 DIAGNOSIS — L89321 Pressure ulcer of left buttock, stage 1: Secondary | ICD-10-CM | POA: Diagnosis not present

## 2018-02-17 DIAGNOSIS — I509 Heart failure, unspecified: Secondary | ICD-10-CM | POA: Diagnosis not present

## 2018-02-17 DIAGNOSIS — I130 Hypertensive heart and chronic kidney disease with heart failure and stage 1 through stage 4 chronic kidney disease, or unspecified chronic kidney disease: Secondary | ICD-10-CM | POA: Diagnosis not present

## 2018-02-18 DIAGNOSIS — L89321 Pressure ulcer of left buttock, stage 1: Secondary | ICD-10-CM | POA: Diagnosis not present

## 2018-02-18 DIAGNOSIS — D509 Iron deficiency anemia, unspecified: Secondary | ICD-10-CM | POA: Diagnosis not present

## 2018-02-18 DIAGNOSIS — J449 Chronic obstructive pulmonary disease, unspecified: Secondary | ICD-10-CM | POA: Diagnosis not present

## 2018-02-18 DIAGNOSIS — L89311 Pressure ulcer of right buttock, stage 1: Secondary | ICD-10-CM | POA: Diagnosis not present

## 2018-02-18 DIAGNOSIS — I509 Heart failure, unspecified: Secondary | ICD-10-CM | POA: Diagnosis not present

## 2018-02-18 DIAGNOSIS — I130 Hypertensive heart and chronic kidney disease with heart failure and stage 1 through stage 4 chronic kidney disease, or unspecified chronic kidney disease: Secondary | ICD-10-CM | POA: Diagnosis not present

## 2018-02-18 DIAGNOSIS — E1122 Type 2 diabetes mellitus with diabetic chronic kidney disease: Secondary | ICD-10-CM | POA: Diagnosis not present

## 2018-02-18 DIAGNOSIS — N183 Chronic kidney disease, stage 3 (moderate): Secondary | ICD-10-CM | POA: Diagnosis not present

## 2018-02-18 DIAGNOSIS — E1142 Type 2 diabetes mellitus with diabetic polyneuropathy: Secondary | ICD-10-CM | POA: Diagnosis not present

## 2018-02-19 DIAGNOSIS — R0902 Hypoxemia: Secondary | ICD-10-CM | POA: Diagnosis not present

## 2018-02-19 DIAGNOSIS — J449 Chronic obstructive pulmonary disease, unspecified: Secondary | ICD-10-CM | POA: Diagnosis not present

## 2018-02-20 DIAGNOSIS — J449 Chronic obstructive pulmonary disease, unspecified: Secondary | ICD-10-CM | POA: Diagnosis not present

## 2018-02-20 DIAGNOSIS — M21372 Foot drop, left foot: Secondary | ICD-10-CM | POA: Diagnosis not present

## 2018-02-20 DIAGNOSIS — I10 Essential (primary) hypertension: Secondary | ICD-10-CM | POA: Diagnosis not present

## 2018-02-20 DIAGNOSIS — C61 Malignant neoplasm of prostate: Secondary | ICD-10-CM | POA: Diagnosis not present

## 2018-02-21 ENCOUNTER — Ambulatory Visit: Payer: Medicare HMO

## 2018-02-21 ENCOUNTER — Other Ambulatory Visit: Payer: Self-pay

## 2018-02-21 DIAGNOSIS — E1122 Type 2 diabetes mellitus with diabetic chronic kidney disease: Secondary | ICD-10-CM | POA: Diagnosis not present

## 2018-02-21 DIAGNOSIS — L89321 Pressure ulcer of left buttock, stage 1: Secondary | ICD-10-CM | POA: Diagnosis not present

## 2018-02-21 DIAGNOSIS — D509 Iron deficiency anemia, unspecified: Secondary | ICD-10-CM | POA: Diagnosis not present

## 2018-02-21 DIAGNOSIS — E1142 Type 2 diabetes mellitus with diabetic polyneuropathy: Secondary | ICD-10-CM | POA: Diagnosis not present

## 2018-02-21 DIAGNOSIS — L89311 Pressure ulcer of right buttock, stage 1: Secondary | ICD-10-CM | POA: Diagnosis not present

## 2018-02-21 DIAGNOSIS — I509 Heart failure, unspecified: Secondary | ICD-10-CM | POA: Diagnosis not present

## 2018-02-21 DIAGNOSIS — N183 Chronic kidney disease, stage 3 (moderate): Secondary | ICD-10-CM | POA: Diagnosis not present

## 2018-02-21 DIAGNOSIS — I130 Hypertensive heart and chronic kidney disease with heart failure and stage 1 through stage 4 chronic kidney disease, or unspecified chronic kidney disease: Secondary | ICD-10-CM | POA: Diagnosis not present

## 2018-02-21 DIAGNOSIS — J449 Chronic obstructive pulmonary disease, unspecified: Secondary | ICD-10-CM | POA: Diagnosis not present

## 2018-02-21 NOTE — Patient Outreach (Signed)
Transition of care:  Placed call to patient and spoke with daughter in law [place]. Marlowe Kays reports that patient is getting stronger. Reports patient is eating better and being more active.  CBG 180's.  Daughter in law reports that patient saw home health nurse today. Reports family has worked out Surveyor, mining amongst themselves. No new problems or concerns today.  PLAN: will continue weekly transition of care calls.  Tomasa Rand, RN, BSN, CEN [facility] [employer] [phone]

## 2018-02-22 DIAGNOSIS — E1122 Type 2 diabetes mellitus with diabetic chronic kidney disease: Secondary | ICD-10-CM | POA: Diagnosis not present

## 2018-02-22 DIAGNOSIS — J449 Chronic obstructive pulmonary disease, unspecified: Secondary | ICD-10-CM | POA: Diagnosis not present

## 2018-02-22 DIAGNOSIS — I509 Heart failure, unspecified: Secondary | ICD-10-CM | POA: Diagnosis not present

## 2018-02-22 DIAGNOSIS — E1142 Type 2 diabetes mellitus with diabetic polyneuropathy: Secondary | ICD-10-CM | POA: Diagnosis not present

## 2018-02-22 DIAGNOSIS — L89321 Pressure ulcer of left buttock, stage 1: Secondary | ICD-10-CM | POA: Diagnosis not present

## 2018-02-22 DIAGNOSIS — N183 Chronic kidney disease, stage 3 (moderate): Secondary | ICD-10-CM | POA: Diagnosis not present

## 2018-02-22 DIAGNOSIS — I130 Hypertensive heart and chronic kidney disease with heart failure and stage 1 through stage 4 chronic kidney disease, or unspecified chronic kidney disease: Secondary | ICD-10-CM | POA: Diagnosis not present

## 2018-02-22 DIAGNOSIS — D509 Iron deficiency anemia, unspecified: Secondary | ICD-10-CM | POA: Diagnosis not present

## 2018-02-22 DIAGNOSIS — L89311 Pressure ulcer of right buttock, stage 1: Secondary | ICD-10-CM | POA: Diagnosis not present

## 2018-02-23 DIAGNOSIS — L89311 Pressure ulcer of right buttock, stage 1: Secondary | ICD-10-CM | POA: Diagnosis not present

## 2018-02-23 DIAGNOSIS — L89321 Pressure ulcer of left buttock, stage 1: Secondary | ICD-10-CM | POA: Diagnosis not present

## 2018-02-23 DIAGNOSIS — N183 Chronic kidney disease, stage 3 (moderate): Secondary | ICD-10-CM | POA: Diagnosis not present

## 2018-02-23 DIAGNOSIS — E1122 Type 2 diabetes mellitus with diabetic chronic kidney disease: Secondary | ICD-10-CM | POA: Diagnosis not present

## 2018-02-23 DIAGNOSIS — I509 Heart failure, unspecified: Secondary | ICD-10-CM | POA: Diagnosis not present

## 2018-02-23 DIAGNOSIS — J449 Chronic obstructive pulmonary disease, unspecified: Secondary | ICD-10-CM | POA: Diagnosis not present

## 2018-02-23 DIAGNOSIS — E1142 Type 2 diabetes mellitus with diabetic polyneuropathy: Secondary | ICD-10-CM | POA: Diagnosis not present

## 2018-02-23 DIAGNOSIS — I130 Hypertensive heart and chronic kidney disease with heart failure and stage 1 through stage 4 chronic kidney disease, or unspecified chronic kidney disease: Secondary | ICD-10-CM | POA: Diagnosis not present

## 2018-02-23 DIAGNOSIS — D509 Iron deficiency anemia, unspecified: Secondary | ICD-10-CM | POA: Diagnosis not present

## 2018-02-24 ENCOUNTER — Encounter (HOSPITAL_COMMUNITY): Payer: Medicare HMO

## 2018-02-24 DIAGNOSIS — C61 Malignant neoplasm of prostate: Secondary | ICD-10-CM | POA: Diagnosis not present

## 2018-02-25 DIAGNOSIS — L89321 Pressure ulcer of left buttock, stage 1: Secondary | ICD-10-CM | POA: Diagnosis not present

## 2018-02-25 DIAGNOSIS — L89311 Pressure ulcer of right buttock, stage 1: Secondary | ICD-10-CM | POA: Diagnosis not present

## 2018-02-25 DIAGNOSIS — E1142 Type 2 diabetes mellitus with diabetic polyneuropathy: Secondary | ICD-10-CM | POA: Diagnosis not present

## 2018-02-25 DIAGNOSIS — I130 Hypertensive heart and chronic kidney disease with heart failure and stage 1 through stage 4 chronic kidney disease, or unspecified chronic kidney disease: Secondary | ICD-10-CM | POA: Diagnosis not present

## 2018-02-25 DIAGNOSIS — I509 Heart failure, unspecified: Secondary | ICD-10-CM | POA: Diagnosis not present

## 2018-02-25 DIAGNOSIS — E1122 Type 2 diabetes mellitus with diabetic chronic kidney disease: Secondary | ICD-10-CM | POA: Diagnosis not present

## 2018-02-25 DIAGNOSIS — J449 Chronic obstructive pulmonary disease, unspecified: Secondary | ICD-10-CM | POA: Diagnosis not present

## 2018-02-25 DIAGNOSIS — N183 Chronic kidney disease, stage 3 (moderate): Secondary | ICD-10-CM | POA: Diagnosis not present

## 2018-02-25 DIAGNOSIS — D509 Iron deficiency anemia, unspecified: Secondary | ICD-10-CM | POA: Diagnosis not present

## 2018-02-28 DIAGNOSIS — L89311 Pressure ulcer of right buttock, stage 1: Secondary | ICD-10-CM | POA: Diagnosis not present

## 2018-02-28 DIAGNOSIS — D509 Iron deficiency anemia, unspecified: Secondary | ICD-10-CM | POA: Diagnosis not present

## 2018-02-28 DIAGNOSIS — J449 Chronic obstructive pulmonary disease, unspecified: Secondary | ICD-10-CM | POA: Diagnosis not present

## 2018-02-28 DIAGNOSIS — E1122 Type 2 diabetes mellitus with diabetic chronic kidney disease: Secondary | ICD-10-CM | POA: Diagnosis not present

## 2018-02-28 DIAGNOSIS — I130 Hypertensive heart and chronic kidney disease with heart failure and stage 1 through stage 4 chronic kidney disease, or unspecified chronic kidney disease: Secondary | ICD-10-CM | POA: Diagnosis not present

## 2018-02-28 DIAGNOSIS — N183 Chronic kidney disease, stage 3 (moderate): Secondary | ICD-10-CM | POA: Diagnosis not present

## 2018-02-28 DIAGNOSIS — L89321 Pressure ulcer of left buttock, stage 1: Secondary | ICD-10-CM | POA: Diagnosis not present

## 2018-02-28 DIAGNOSIS — I509 Heart failure, unspecified: Secondary | ICD-10-CM | POA: Diagnosis not present

## 2018-02-28 DIAGNOSIS — E1142 Type 2 diabetes mellitus with diabetic polyneuropathy: Secondary | ICD-10-CM | POA: Diagnosis not present

## 2018-03-01 ENCOUNTER — Other Ambulatory Visit: Payer: Self-pay

## 2018-03-01 NOTE — Patient Outreach (Signed)
Transition of care: Placed call to daughter in law, Marlowe Kays, who reports that patient is doing well. Reports his wife is home for 1 week and she is staying with the patient. Reports family is checking on patient at least 3 times per day.  Reports patient is walking well and worked with PT yesterday. Denies any new concerns or problems today.  PLAN:Encouraged patient to continue to be active. Reviewed fall precautions and when to call MD. Will continue weekly outreach efforts for transition of care.  Tomasa Rand, RN, BSN, CEN [facility] [employer] [phone]

## 2018-03-02 DIAGNOSIS — L89311 Pressure ulcer of right buttock, stage 1: Secondary | ICD-10-CM | POA: Diagnosis not present

## 2018-03-02 DIAGNOSIS — I509 Heart failure, unspecified: Secondary | ICD-10-CM | POA: Diagnosis not present

## 2018-03-02 DIAGNOSIS — N183 Chronic kidney disease, stage 3 (moderate): Secondary | ICD-10-CM | POA: Diagnosis not present

## 2018-03-02 DIAGNOSIS — J449 Chronic obstructive pulmonary disease, unspecified: Secondary | ICD-10-CM | POA: Diagnosis not present

## 2018-03-02 DIAGNOSIS — L89321 Pressure ulcer of left buttock, stage 1: Secondary | ICD-10-CM | POA: Diagnosis not present

## 2018-03-02 DIAGNOSIS — D509 Iron deficiency anemia, unspecified: Secondary | ICD-10-CM | POA: Diagnosis not present

## 2018-03-02 DIAGNOSIS — E1122 Type 2 diabetes mellitus with diabetic chronic kidney disease: Secondary | ICD-10-CM | POA: Diagnosis not present

## 2018-03-02 DIAGNOSIS — E1142 Type 2 diabetes mellitus with diabetic polyneuropathy: Secondary | ICD-10-CM | POA: Diagnosis not present

## 2018-03-02 DIAGNOSIS — I130 Hypertensive heart and chronic kidney disease with heart failure and stage 1 through stage 4 chronic kidney disease, or unspecified chronic kidney disease: Secondary | ICD-10-CM | POA: Diagnosis not present

## 2018-03-03 ENCOUNTER — Encounter (HOSPITAL_COMMUNITY): Payer: Medicare HMO

## 2018-03-03 ENCOUNTER — Encounter (HOSPITAL_COMMUNITY): Payer: Self-pay

## 2018-03-03 ENCOUNTER — Encounter (HOSPITAL_COMMUNITY)
Admission: RE | Admit: 2018-03-03 | Discharge: 2018-03-03 | Disposition: A | Discharge status: Home / Self Care | Payer: Medicare HMO | Source: Ambulatory Visit | Attending: Urology | Admitting: Urology

## 2018-03-03 DIAGNOSIS — C61 Malignant neoplasm of prostate: Secondary | ICD-10-CM | POA: Insufficient documentation

## 2018-03-04 DIAGNOSIS — C775 Secondary and unspecified malignant neoplasm of intrapelvic lymph nodes: Secondary | ICD-10-CM | POA: Diagnosis not present

## 2018-03-04 DIAGNOSIS — C7951 Secondary malignant neoplasm of bone: Secondary | ICD-10-CM | POA: Diagnosis not present

## 2018-03-04 DIAGNOSIS — C61 Malignant neoplasm of prostate: Secondary | ICD-10-CM | POA: Diagnosis not present

## 2018-03-07 DIAGNOSIS — L89321 Pressure ulcer of left buttock, stage 1: Secondary | ICD-10-CM | POA: Diagnosis not present

## 2018-03-07 DIAGNOSIS — N183 Chronic kidney disease, stage 3 (moderate): Secondary | ICD-10-CM | POA: Diagnosis not present

## 2018-03-07 DIAGNOSIS — L89311 Pressure ulcer of right buttock, stage 1: Secondary | ICD-10-CM | POA: Diagnosis not present

## 2018-03-07 DIAGNOSIS — E1122 Type 2 diabetes mellitus with diabetic chronic kidney disease: Secondary | ICD-10-CM | POA: Diagnosis not present

## 2018-03-07 DIAGNOSIS — D509 Iron deficiency anemia, unspecified: Secondary | ICD-10-CM | POA: Diagnosis not present

## 2018-03-07 DIAGNOSIS — J449 Chronic obstructive pulmonary disease, unspecified: Secondary | ICD-10-CM | POA: Diagnosis not present

## 2018-03-07 DIAGNOSIS — I509 Heart failure, unspecified: Secondary | ICD-10-CM | POA: Diagnosis not present

## 2018-03-07 DIAGNOSIS — I130 Hypertensive heart and chronic kidney disease with heart failure and stage 1 through stage 4 chronic kidney disease, or unspecified chronic kidney disease: Secondary | ICD-10-CM | POA: Diagnosis not present

## 2018-03-07 DIAGNOSIS — E1142 Type 2 diabetes mellitus with diabetic polyneuropathy: Secondary | ICD-10-CM | POA: Diagnosis not present

## 2018-03-08 ENCOUNTER — Other Ambulatory Visit: Payer: Self-pay

## 2018-03-08 NOTE — Patient Outreach (Signed)
Transition of care: Placed call to patient and spoke with daughter in law [place] who reports that patient is doing well. Patient complaining of knee pain. Denies injury. Reports MD appointments went well. Return follow up in 3 months. Reports that patient continues to weigh daily.  Bedsore continues to be well healed. Completed speech therapy yesterday and was released from speech therapy.   Daughter in law denied any new concerns. Patients wife left yesterday and daughter is staying with patient this week.  PLAN: will call back next week for transition of care. Encouraged patient to to call MD for increased problems or pain with knee.  Tomasa Rand, RN, BSN, CEN [facility] [employer] [phone]

## 2018-03-09 DIAGNOSIS — N183 Chronic kidney disease, stage 3 (moderate): Secondary | ICD-10-CM | POA: Diagnosis not present

## 2018-03-09 DIAGNOSIS — L89311 Pressure ulcer of right buttock, stage 1: Secondary | ICD-10-CM | POA: Diagnosis not present

## 2018-03-09 DIAGNOSIS — I509 Heart failure, unspecified: Secondary | ICD-10-CM | POA: Diagnosis not present

## 2018-03-09 DIAGNOSIS — E1142 Type 2 diabetes mellitus with diabetic polyneuropathy: Secondary | ICD-10-CM | POA: Diagnosis not present

## 2018-03-09 DIAGNOSIS — L89321 Pressure ulcer of left buttock, stage 1: Secondary | ICD-10-CM | POA: Diagnosis not present

## 2018-03-09 DIAGNOSIS — I130 Hypertensive heart and chronic kidney disease with heart failure and stage 1 through stage 4 chronic kidney disease, or unspecified chronic kidney disease: Secondary | ICD-10-CM | POA: Diagnosis not present

## 2018-03-09 DIAGNOSIS — E1122 Type 2 diabetes mellitus with diabetic chronic kidney disease: Secondary | ICD-10-CM | POA: Diagnosis not present

## 2018-03-09 DIAGNOSIS — D509 Iron deficiency anemia, unspecified: Secondary | ICD-10-CM | POA: Diagnosis not present

## 2018-03-09 DIAGNOSIS — J449 Chronic obstructive pulmonary disease, unspecified: Secondary | ICD-10-CM | POA: Diagnosis not present

## 2018-03-14 DIAGNOSIS — N183 Chronic kidney disease, stage 3 (moderate): Secondary | ICD-10-CM | POA: Diagnosis not present

## 2018-03-14 DIAGNOSIS — E1142 Type 2 diabetes mellitus with diabetic polyneuropathy: Secondary | ICD-10-CM | POA: Diagnosis not present

## 2018-03-14 DIAGNOSIS — J449 Chronic obstructive pulmonary disease, unspecified: Secondary | ICD-10-CM | POA: Diagnosis not present

## 2018-03-14 DIAGNOSIS — I509 Heart failure, unspecified: Secondary | ICD-10-CM | POA: Diagnosis not present

## 2018-03-14 DIAGNOSIS — D509 Iron deficiency anemia, unspecified: Secondary | ICD-10-CM | POA: Diagnosis not present

## 2018-03-14 DIAGNOSIS — L89311 Pressure ulcer of right buttock, stage 1: Secondary | ICD-10-CM | POA: Diagnosis not present

## 2018-03-14 DIAGNOSIS — I130 Hypertensive heart and chronic kidney disease with heart failure and stage 1 through stage 4 chronic kidney disease, or unspecified chronic kidney disease: Secondary | ICD-10-CM | POA: Diagnosis not present

## 2018-03-14 DIAGNOSIS — E1122 Type 2 diabetes mellitus with diabetic chronic kidney disease: Secondary | ICD-10-CM | POA: Diagnosis not present

## 2018-03-14 DIAGNOSIS — L89321 Pressure ulcer of left buttock, stage 1: Secondary | ICD-10-CM | POA: Diagnosis not present

## 2018-03-15 DIAGNOSIS — D509 Iron deficiency anemia, unspecified: Secondary | ICD-10-CM | POA: Diagnosis not present

## 2018-03-15 DIAGNOSIS — E1142 Type 2 diabetes mellitus with diabetic polyneuropathy: Secondary | ICD-10-CM | POA: Diagnosis not present

## 2018-03-15 DIAGNOSIS — J449 Chronic obstructive pulmonary disease, unspecified: Secondary | ICD-10-CM | POA: Diagnosis not present

## 2018-03-15 DIAGNOSIS — I509 Heart failure, unspecified: Secondary | ICD-10-CM | POA: Diagnosis not present

## 2018-03-15 DIAGNOSIS — E1122 Type 2 diabetes mellitus with diabetic chronic kidney disease: Secondary | ICD-10-CM | POA: Diagnosis not present

## 2018-03-15 DIAGNOSIS — L89321 Pressure ulcer of left buttock, stage 1: Secondary | ICD-10-CM | POA: Diagnosis not present

## 2018-03-15 DIAGNOSIS — N183 Chronic kidney disease, stage 3 (moderate): Secondary | ICD-10-CM | POA: Diagnosis not present

## 2018-03-15 DIAGNOSIS — I130 Hypertensive heart and chronic kidney disease with heart failure and stage 1 through stage 4 chronic kidney disease, or unspecified chronic kidney disease: Secondary | ICD-10-CM | POA: Diagnosis not present

## 2018-03-15 DIAGNOSIS — L89311 Pressure ulcer of right buttock, stage 1: Secondary | ICD-10-CM | POA: Diagnosis not present

## 2018-03-16 DIAGNOSIS — L89311 Pressure ulcer of right buttock, stage 1: Secondary | ICD-10-CM | POA: Diagnosis not present

## 2018-03-16 DIAGNOSIS — D509 Iron deficiency anemia, unspecified: Secondary | ICD-10-CM | POA: Diagnosis not present

## 2018-03-16 DIAGNOSIS — L89321 Pressure ulcer of left buttock, stage 1: Secondary | ICD-10-CM | POA: Diagnosis not present

## 2018-03-16 DIAGNOSIS — E1122 Type 2 diabetes mellitus with diabetic chronic kidney disease: Secondary | ICD-10-CM | POA: Diagnosis not present

## 2018-03-16 DIAGNOSIS — I509 Heart failure, unspecified: Secondary | ICD-10-CM | POA: Diagnosis not present

## 2018-03-16 DIAGNOSIS — N183 Chronic kidney disease, stage 3 (moderate): Secondary | ICD-10-CM | POA: Diagnosis not present

## 2018-03-16 DIAGNOSIS — E1142 Type 2 diabetes mellitus with diabetic polyneuropathy: Secondary | ICD-10-CM | POA: Diagnosis not present

## 2018-03-16 DIAGNOSIS — I130 Hypertensive heart and chronic kidney disease with heart failure and stage 1 through stage 4 chronic kidney disease, or unspecified chronic kidney disease: Secondary | ICD-10-CM | POA: Diagnosis not present

## 2018-03-16 DIAGNOSIS — J449 Chronic obstructive pulmonary disease, unspecified: Secondary | ICD-10-CM | POA: Diagnosis not present

## 2018-03-17 ENCOUNTER — Other Ambulatory Visit: Payer: Self-pay

## 2018-03-17 DIAGNOSIS — E538 Deficiency of other specified B group vitamins: Secondary | ICD-10-CM | POA: Diagnosis not present

## 2018-03-17 DIAGNOSIS — E1142 Type 2 diabetes mellitus with diabetic polyneuropathy: Secondary | ICD-10-CM | POA: Diagnosis not present

## 2018-03-17 DIAGNOSIS — N183 Chronic kidney disease, stage 3 (moderate): Secondary | ICD-10-CM | POA: Diagnosis not present

## 2018-03-17 DIAGNOSIS — I509 Heart failure, unspecified: Secondary | ICD-10-CM | POA: Diagnosis not present

## 2018-03-17 DIAGNOSIS — J449 Chronic obstructive pulmonary disease, unspecified: Secondary | ICD-10-CM | POA: Diagnosis not present

## 2018-03-17 DIAGNOSIS — D508 Other iron deficiency anemias: Secondary | ICD-10-CM | POA: Diagnosis not present

## 2018-03-17 DIAGNOSIS — F3289 Other specified depressive episodes: Secondary | ICD-10-CM | POA: Diagnosis not present

## 2018-03-17 DIAGNOSIS — E1122 Type 2 diabetes mellitus with diabetic chronic kidney disease: Secondary | ICD-10-CM | POA: Diagnosis not present

## 2018-03-17 DIAGNOSIS — I130 Hypertensive heart and chronic kidney disease with heart failure and stage 1 through stage 4 chronic kidney disease, or unspecified chronic kidney disease: Secondary | ICD-10-CM | POA: Diagnosis not present

## 2018-03-17 NOTE — Patient Outreach (Signed)
Transition of care:  Today's Vitals   03/17/18 1254  Weight: 187 lb (84.8 kg)    Placed call to patient and spoke with daughter in law, [place].  Marlowe Kays reports patient is doing well. Continues to weigh, monitor CBG and BP.   Daughter in law states CBG is 130 to 180.  Patient is following low salt diet. Marlowe Kays reports that patient remains active with home health PT for 3 more week. Patient continues to use walker.  Skin continues to be healed per daughter in law.  PLAN: Patient has successfully completed transition of care program. I will plan to follow up via phone in 30 days. Encouraged daughter in law to call sooner if needed.  Tomasa Rand, RN, BSN, CEN [facility] [employer] [phone]

## 2018-03-22 DIAGNOSIS — R0902 Hypoxemia: Secondary | ICD-10-CM | POA: Diagnosis not present

## 2018-03-22 DIAGNOSIS — N183 Chronic kidney disease, stage 3 (moderate): Secondary | ICD-10-CM | POA: Diagnosis not present

## 2018-03-22 DIAGNOSIS — F329 Major depressive disorder, single episode, unspecified: Secondary | ICD-10-CM | POA: Diagnosis not present

## 2018-03-22 DIAGNOSIS — I130 Hypertensive heart and chronic kidney disease with heart failure and stage 1 through stage 4 chronic kidney disease, or unspecified chronic kidney disease: Secondary | ICD-10-CM | POA: Diagnosis not present

## 2018-03-22 DIAGNOSIS — D509 Iron deficiency anemia, unspecified: Secondary | ICD-10-CM | POA: Diagnosis not present

## 2018-03-22 DIAGNOSIS — E538 Deficiency of other specified B group vitamins: Secondary | ICD-10-CM | POA: Diagnosis not present

## 2018-03-22 DIAGNOSIS — J449 Chronic obstructive pulmonary disease, unspecified: Secondary | ICD-10-CM | POA: Diagnosis not present

## 2018-03-22 DIAGNOSIS — E1142 Type 2 diabetes mellitus with diabetic polyneuropathy: Secondary | ICD-10-CM | POA: Diagnosis not present

## 2018-03-22 DIAGNOSIS — I509 Heart failure, unspecified: Secondary | ICD-10-CM | POA: Diagnosis not present

## 2018-03-22 DIAGNOSIS — E1122 Type 2 diabetes mellitus with diabetic chronic kidney disease: Secondary | ICD-10-CM | POA: Diagnosis not present

## 2018-03-23 DIAGNOSIS — C61 Malignant neoplasm of prostate: Secondary | ICD-10-CM | POA: Diagnosis not present

## 2018-03-23 DIAGNOSIS — M21372 Foot drop, left foot: Secondary | ICD-10-CM | POA: Diagnosis not present

## 2018-03-23 DIAGNOSIS — J449 Chronic obstructive pulmonary disease, unspecified: Secondary | ICD-10-CM | POA: Diagnosis not present

## 2018-03-23 DIAGNOSIS — I10 Essential (primary) hypertension: Secondary | ICD-10-CM | POA: Diagnosis not present

## 2018-03-25 DIAGNOSIS — D509 Iron deficiency anemia, unspecified: Secondary | ICD-10-CM | POA: Diagnosis not present

## 2018-03-25 DIAGNOSIS — F329 Major depressive disorder, single episode, unspecified: Secondary | ICD-10-CM | POA: Diagnosis not present

## 2018-03-25 DIAGNOSIS — N183 Chronic kidney disease, stage 3 (moderate): Secondary | ICD-10-CM | POA: Diagnosis not present

## 2018-03-25 DIAGNOSIS — I130 Hypertensive heart and chronic kidney disease with heart failure and stage 1 through stage 4 chronic kidney disease, or unspecified chronic kidney disease: Secondary | ICD-10-CM | POA: Diagnosis not present

## 2018-03-25 DIAGNOSIS — J449 Chronic obstructive pulmonary disease, unspecified: Secondary | ICD-10-CM | POA: Diagnosis not present

## 2018-03-25 DIAGNOSIS — E1122 Type 2 diabetes mellitus with diabetic chronic kidney disease: Secondary | ICD-10-CM | POA: Diagnosis not present

## 2018-03-25 DIAGNOSIS — E538 Deficiency of other specified B group vitamins: Secondary | ICD-10-CM | POA: Diagnosis not present

## 2018-03-25 DIAGNOSIS — I509 Heart failure, unspecified: Secondary | ICD-10-CM | POA: Diagnosis not present

## 2018-03-25 DIAGNOSIS — E1142 Type 2 diabetes mellitus with diabetic polyneuropathy: Secondary | ICD-10-CM | POA: Diagnosis not present

## 2018-03-30 DIAGNOSIS — E1142 Type 2 diabetes mellitus with diabetic polyneuropathy: Secondary | ICD-10-CM | POA: Diagnosis not present

## 2018-03-30 DIAGNOSIS — N183 Chronic kidney disease, stage 3 (moderate): Secondary | ICD-10-CM | POA: Diagnosis not present

## 2018-03-30 DIAGNOSIS — J449 Chronic obstructive pulmonary disease, unspecified: Secondary | ICD-10-CM | POA: Diagnosis not present

## 2018-03-30 DIAGNOSIS — E1122 Type 2 diabetes mellitus with diabetic chronic kidney disease: Secondary | ICD-10-CM | POA: Diagnosis not present

## 2018-03-30 DIAGNOSIS — F329 Major depressive disorder, single episode, unspecified: Secondary | ICD-10-CM | POA: Diagnosis not present

## 2018-03-30 DIAGNOSIS — I509 Heart failure, unspecified: Secondary | ICD-10-CM | POA: Diagnosis not present

## 2018-03-30 DIAGNOSIS — E538 Deficiency of other specified B group vitamins: Secondary | ICD-10-CM | POA: Diagnosis not present

## 2018-03-30 DIAGNOSIS — I130 Hypertensive heart and chronic kidney disease with heart failure and stage 1 through stage 4 chronic kidney disease, or unspecified chronic kidney disease: Secondary | ICD-10-CM | POA: Diagnosis not present

## 2018-03-30 DIAGNOSIS — D509 Iron deficiency anemia, unspecified: Secondary | ICD-10-CM | POA: Diagnosis not present

## 2018-04-02 DIAGNOSIS — J449 Chronic obstructive pulmonary disease, unspecified: Secondary | ICD-10-CM | POA: Diagnosis not present

## 2018-04-04 DIAGNOSIS — E1142 Type 2 diabetes mellitus with diabetic polyneuropathy: Secondary | ICD-10-CM | POA: Diagnosis not present

## 2018-04-04 DIAGNOSIS — E1122 Type 2 diabetes mellitus with diabetic chronic kidney disease: Secondary | ICD-10-CM | POA: Diagnosis not present

## 2018-04-04 DIAGNOSIS — I130 Hypertensive heart and chronic kidney disease with heart failure and stage 1 through stage 4 chronic kidney disease, or unspecified chronic kidney disease: Secondary | ICD-10-CM | POA: Diagnosis not present

## 2018-04-04 DIAGNOSIS — I509 Heart failure, unspecified: Secondary | ICD-10-CM | POA: Diagnosis not present

## 2018-04-04 DIAGNOSIS — N183 Chronic kidney disease, stage 3 (moderate): Secondary | ICD-10-CM | POA: Diagnosis not present

## 2018-04-04 DIAGNOSIS — E538 Deficiency of other specified B group vitamins: Secondary | ICD-10-CM | POA: Diagnosis not present

## 2018-04-04 DIAGNOSIS — J449 Chronic obstructive pulmonary disease, unspecified: Secondary | ICD-10-CM | POA: Diagnosis not present

## 2018-04-04 DIAGNOSIS — D509 Iron deficiency anemia, unspecified: Secondary | ICD-10-CM | POA: Diagnosis not present

## 2018-04-04 DIAGNOSIS — F329 Major depressive disorder, single episode, unspecified: Secondary | ICD-10-CM | POA: Diagnosis not present

## 2018-04-05 DIAGNOSIS — F0390 Unspecified dementia without behavioral disturbance: Secondary | ICD-10-CM | POA: Diagnosis not present

## 2018-04-05 DIAGNOSIS — W19XXXA Unspecified fall, initial encounter: Secondary | ICD-10-CM | POA: Diagnosis not present

## 2018-04-05 DIAGNOSIS — Z6828 Body mass index (BMI) 28.0-28.9, adult: Secondary | ICD-10-CM | POA: Diagnosis not present

## 2018-04-05 DIAGNOSIS — R404 Transient alteration of awareness: Secondary | ICD-10-CM | POA: Diagnosis not present

## 2018-04-08 DIAGNOSIS — F0390 Unspecified dementia without behavioral disturbance: Secondary | ICD-10-CM | POA: Diagnosis not present

## 2018-04-08 DIAGNOSIS — J449 Chronic obstructive pulmonary disease, unspecified: Secondary | ICD-10-CM | POA: Diagnosis not present

## 2018-04-08 DIAGNOSIS — D509 Iron deficiency anemia, unspecified: Secondary | ICD-10-CM | POA: Diagnosis not present

## 2018-04-08 DIAGNOSIS — W19XXXA Unspecified fall, initial encounter: Secondary | ICD-10-CM | POA: Diagnosis not present

## 2018-04-08 DIAGNOSIS — R404 Transient alteration of awareness: Secondary | ICD-10-CM | POA: Diagnosis not present

## 2018-04-08 DIAGNOSIS — E1122 Type 2 diabetes mellitus with diabetic chronic kidney disease: Secondary | ICD-10-CM | POA: Diagnosis not present

## 2018-04-08 DIAGNOSIS — Z6828 Body mass index (BMI) 28.0-28.9, adult: Secondary | ICD-10-CM | POA: Diagnosis not present

## 2018-04-18 ENCOUNTER — Other Ambulatory Visit: Payer: Self-pay

## 2018-04-18 NOTE — Patient Outreach (Signed)
Telephone follow up:  Placed follow up call to daughter in law.  Daughter in law, Marlowe Kays reports patient is doing well. States memory issues continue to get worse and patient continues to have anger problems. Patient saw MD 1 week ago and was put on a pill for his "memory".  Daughter in law unable to recall name of medication.    Daughter in law, reports weight is stable from [place]. Cbg ranges in the 140's.  Reports no swelling.    Family continues to rotate taking care of patient. Wife has returned to [place].   Daughter in law denies any new problems or concerns except worsening memory.  PLAN: next follow up in 1 month. Encouraged daughter in law to call sooner if needed.  Tomasa Rand, RN, BSN, CEN [facility] [employer] [phone]

## 2018-04-21 DIAGNOSIS — R0902 Hypoxemia: Secondary | ICD-10-CM | POA: Diagnosis not present

## 2018-04-21 DIAGNOSIS — J449 Chronic obstructive pulmonary disease, unspecified: Secondary | ICD-10-CM | POA: Diagnosis not present

## 2018-04-22 DIAGNOSIS — M21372 Foot drop, left foot: Secondary | ICD-10-CM | POA: Diagnosis not present

## 2018-04-22 DIAGNOSIS — I10 Essential (primary) hypertension: Secondary | ICD-10-CM | POA: Diagnosis not present

## 2018-04-22 DIAGNOSIS — C61 Malignant neoplasm of prostate: Secondary | ICD-10-CM | POA: Diagnosis not present

## 2018-04-22 DIAGNOSIS — J449 Chronic obstructive pulmonary disease, unspecified: Secondary | ICD-10-CM | POA: Diagnosis not present

## 2018-05-02 DIAGNOSIS — J449 Chronic obstructive pulmonary disease, unspecified: Secondary | ICD-10-CM | POA: Diagnosis not present

## 2018-05-18 ENCOUNTER — Encounter (HOSPITAL_COMMUNITY)
Admission: RE | Admit: 2018-05-18 | Discharge: 2018-05-18 | Disposition: A | Discharge status: Home / Self Care | Payer: Medicare HMO | Source: Ambulatory Visit | Attending: Urology | Admitting: Urology

## 2018-05-18 DIAGNOSIS — C61 Malignant neoplasm of prostate: Secondary | ICD-10-CM | POA: Diagnosis not present

## 2018-05-18 DIAGNOSIS — R972 Elevated prostate specific antigen [PSA]: Secondary | ICD-10-CM | POA: Diagnosis not present

## 2018-05-18 MED ORDER — TECHNETIUM TC 99M MEDRONATE IV KIT
21.1000 | PACK | Freq: Once | INTRAVENOUS | Status: AC | PRN
  Administered 2018-05-18: 21.1 via INTRAVENOUS

## 2018-05-22 DIAGNOSIS — R0902 Hypoxemia: Secondary | ICD-10-CM | POA: Diagnosis not present

## 2018-05-22 DIAGNOSIS — J449 Chronic obstructive pulmonary disease, unspecified: Secondary | ICD-10-CM | POA: Diagnosis not present

## 2018-05-23 ENCOUNTER — Other Ambulatory Visit: Payer: Self-pay

## 2018-05-23 DIAGNOSIS — C61 Malignant neoplasm of prostate: Secondary | ICD-10-CM | POA: Diagnosis not present

## 2018-05-23 DIAGNOSIS — J449 Chronic obstructive pulmonary disease, unspecified: Secondary | ICD-10-CM | POA: Diagnosis not present

## 2018-05-23 DIAGNOSIS — M21372 Foot drop, left foot: Secondary | ICD-10-CM | POA: Diagnosis not present

## 2018-05-23 DIAGNOSIS — I10 Essential (primary) hypertension: Secondary | ICD-10-CM | POA: Diagnosis not present

## 2018-05-23 NOTE — Patient Outreach (Signed)
Case closure 90 day follow up:  Placed call to patient and spoke with Derrel Nip ( daughter in law) who states that patient is doing well. No real changes. Reports CBG range of 130-140. Reports weight is stable at 193 pounds. Denies any new problems or concerns. Family continues to take turns taking care of patient.   PLAN: discussed case closure with daughter in law and she is in agreement. Will plan to close case as goals are met. Will send MD and patient case closure letters.   Tomasa Rand, RN, BSN, CEN [facility] [employer] [phone]

## 2018-06-02 DIAGNOSIS — J449 Chronic obstructive pulmonary disease, unspecified: Secondary | ICD-10-CM | POA: Diagnosis not present

## 2018-06-22 DIAGNOSIS — R0902 Hypoxemia: Secondary | ICD-10-CM | POA: Diagnosis not present

## 2018-06-22 DIAGNOSIS — J449 Chronic obstructive pulmonary disease, unspecified: Secondary | ICD-10-CM | POA: Diagnosis not present

## 2018-06-23 DIAGNOSIS — M21372 Foot drop, left foot: Secondary | ICD-10-CM | POA: Diagnosis not present

## 2018-06-23 DIAGNOSIS — I10 Essential (primary) hypertension: Secondary | ICD-10-CM | POA: Diagnosis not present

## 2018-06-23 DIAGNOSIS — J449 Chronic obstructive pulmonary disease, unspecified: Secondary | ICD-10-CM | POA: Diagnosis not present

## 2018-06-23 DIAGNOSIS — C61 Malignant neoplasm of prostate: Secondary | ICD-10-CM | POA: Diagnosis not present

## 2018-06-28 DIAGNOSIS — R21 Rash and other nonspecific skin eruption: Secondary | ICD-10-CM | POA: Diagnosis not present

## 2018-06-28 DIAGNOSIS — F0390 Unspecified dementia without behavioral disturbance: Secondary | ICD-10-CM | POA: Diagnosis not present

## 2018-06-28 DIAGNOSIS — R269 Unspecified abnormalities of gait and mobility: Secondary | ICD-10-CM | POA: Diagnosis not present

## 2018-06-28 DIAGNOSIS — D509 Iron deficiency anemia, unspecified: Secondary | ICD-10-CM | POA: Diagnosis not present

## 2018-06-28 DIAGNOSIS — J449 Chronic obstructive pulmonary disease, unspecified: Secondary | ICD-10-CM | POA: Diagnosis not present

## 2018-06-28 DIAGNOSIS — D649 Anemia, unspecified: Secondary | ICD-10-CM | POA: Diagnosis not present

## 2018-06-28 DIAGNOSIS — Z6829 Body mass index (BMI) 29.0-29.9, adult: Secondary | ICD-10-CM | POA: Diagnosis not present

## 2018-06-28 DIAGNOSIS — M25569 Pain in unspecified knee: Secondary | ICD-10-CM | POA: Diagnosis not present

## 2018-07-03 DIAGNOSIS — J449 Chronic obstructive pulmonary disease, unspecified: Secondary | ICD-10-CM | POA: Diagnosis not present

## 2018-07-05 ENCOUNTER — Other Ambulatory Visit (HOSPITAL_COMMUNITY): Payer: Self-pay | Admitting: *Deleted

## 2018-07-06 ENCOUNTER — Ambulatory Visit (HOSPITAL_COMMUNITY)
Admission: RE | Admit: 2018-07-06 | Discharge: 2018-07-06 | Disposition: A | Discharge status: Home / Self Care | Payer: Medicare HMO | Source: Ambulatory Visit | Attending: Internal Medicine | Admitting: Internal Medicine

## 2018-07-06 DIAGNOSIS — D649 Anemia, unspecified: Secondary | ICD-10-CM | POA: Diagnosis not present

## 2018-07-06 MED ORDER — SODIUM CHLORIDE 0.9 % IV SOLN
510.0000 mg | INTRAVENOUS | Status: DC
  Administered 2018-07-06: 510 mg via INTRAVENOUS
  Filled 2018-07-06: qty 17

## 2018-07-06 NOTE — Discharge Instructions (Signed)

## 2018-07-13 ENCOUNTER — Encounter (HOSPITAL_COMMUNITY)
Admission: RE | Admit: 2018-07-13 | Discharge: 2018-07-13 | Disposition: A | Discharge status: Home / Self Care | Payer: Medicare HMO | Source: Ambulatory Visit | Attending: Internal Medicine | Admitting: Internal Medicine

## 2018-07-13 DIAGNOSIS — D649 Anemia, unspecified: Secondary | ICD-10-CM | POA: Insufficient documentation

## 2018-07-13 MED ORDER — SODIUM CHLORIDE 0.9 % IV SOLN
510.0000 mg | INTRAVENOUS | Status: AC
  Administered 2018-07-13: 510 mg via INTRAVENOUS
  Filled 2018-07-13: qty 17

## 2018-07-21 ENCOUNTER — Other Ambulatory Visit: Payer: Self-pay

## 2018-07-21 ENCOUNTER — Emergency Department (HOSPITAL_COMMUNITY): Payer: Medicare HMO

## 2018-07-21 ENCOUNTER — Encounter (HOSPITAL_COMMUNITY): Payer: Self-pay | Admitting: Emergency Medicine

## 2018-07-21 ENCOUNTER — Emergency Department (HOSPITAL_COMMUNITY)
Admission: EM | Admit: 2018-07-21 | Discharge: 2018-07-22 | Disposition: A | Discharge status: Home / Self Care | Payer: Medicare HMO | Attending: Emergency Medicine | Admitting: Emergency Medicine

## 2018-07-21 DIAGNOSIS — Z87891 Personal history of nicotine dependence: Secondary | ICD-10-CM | POA: Diagnosis not present

## 2018-07-21 DIAGNOSIS — E119 Type 2 diabetes mellitus without complications: Secondary | ICD-10-CM | POA: Insufficient documentation

## 2018-07-21 DIAGNOSIS — R29818 Other symptoms and signs involving the nervous system: Secondary | ICD-10-CM | POA: Diagnosis not present

## 2018-07-21 DIAGNOSIS — Z79899 Other long term (current) drug therapy: Secondary | ICD-10-CM | POA: Diagnosis not present

## 2018-07-21 DIAGNOSIS — J449 Chronic obstructive pulmonary disease, unspecified: Secondary | ICD-10-CM | POA: Diagnosis not present

## 2018-07-21 DIAGNOSIS — Z7982 Long term (current) use of aspirin: Secondary | ICD-10-CM | POA: Insufficient documentation

## 2018-07-21 DIAGNOSIS — R531 Weakness: Secondary | ICD-10-CM | POA: Diagnosis not present

## 2018-07-21 DIAGNOSIS — I10 Essential (primary) hypertension: Secondary | ICD-10-CM | POA: Diagnosis not present

## 2018-07-21 LAB — CBC
HEMATOCRIT: 35.7 % — AB (ref 39.0–52.0)
HEMOGLOBIN: 10.3 g/dL — AB (ref 13.0–17.0)
MCH: 23.5 pg — ABNORMAL LOW (ref 26.0–34.0)
MCHC: 28.9 g/dL — ABNORMAL LOW (ref 30.0–36.0)
MCV: 81.3 fL (ref 80.0–100.0)
NRBC: 0 % (ref 0.0–0.2)
PLATELETS: 288 10*3/uL (ref 150–400)
RBC: 4.39 MIL/uL (ref 4.22–5.81)
RDW: 18 % — ABNORMAL HIGH (ref 11.5–15.5)
WBC: 8.6 10*3/uL (ref 4.0–10.5)

## 2018-07-21 LAB — BASIC METABOLIC PANEL
ANION GAP: 14 (ref 5–15)
BUN: 20 mg/dL (ref 8–23)
CHLORIDE: 95 mmol/L — AB (ref 98–111)
CO2: 31 mmol/L (ref 22–32)
Calcium: 10.5 mg/dL — ABNORMAL HIGH (ref 8.9–10.3)
Creatinine, Ser: 1.62 mg/dL — ABNORMAL HIGH (ref 0.61–1.24)
GFR calc non Af Amer: 39 mL/min — ABNORMAL LOW (ref >60)
GFR, EST AFRICAN AMERICAN: 45 mL/min — AB (ref >60)
GLUCOSE: 113 mg/dL — AB (ref 70–99)
POTASSIUM: 5.6 mmol/L — AB (ref 3.5–5.1)
Sodium: 140 mmol/L (ref 135–145)

## 2018-07-21 LAB — TYPE AND SCREEN
ABO/RH(D): A NEG
ANTIBODY SCREEN: NEGATIVE

## 2018-07-21 LAB — CBG MONITORING, ED: Glucose-Capillary: 109 mg/dL — ABNORMAL HIGH (ref 70–99)

## 2018-07-21 MED ORDER — IPRATROPIUM-ALBUTEROL 0.5-2.5 (3) MG/3ML IN SOLN
3.0000 mL | Freq: Once | RESPIRATORY_TRACT | Status: AC
  Administered 2018-07-21: 3 mL via RESPIRATORY_TRACT
  Filled 2018-07-21: qty 3

## 2018-07-21 MED ORDER — SODIUM CHLORIDE 0.9 % IV BOLUS
1000.0000 mL | Freq: Once | INTRAVENOUS | Status: AC
  Administered 2018-07-22: 1000 mL via INTRAVENOUS

## 2018-07-21 NOTE — ED Triage Notes (Signed)
Pt received an iron transfusion one week ago. Since then, pt has been becoming more and more weak in both legs. Difficult time getting up. Denies pain. Pt uses prn O2 [place] at home at 4L.

## 2018-07-21 NOTE — ED Provider Notes (Signed)
Patient placed in Quick Look pathway, seen and evaluated   Chief Complaint: Leg weakness  HPI:   77 year old male presents with bilateral leg weakness. PMH of anemia, COPD on 3L of O2 24/7. He has been receiving iron transfusions over the past couple weeks because this was critically low. He has a home health aide who helps him and today was too weak to stand and had an assisted fall to the ground therefore he was recommended to come to the ED. He denies any bleeding. He is not having any pain. He has chronic SOB and is a smoker. He is more SOB than usual because he hasn't done a breathing tx today.  ROS: +bilateral leg weakness  Physical Exam:   Gen: No distress. On 4.5 L O2  Neuro: Awake and Alert  Skin: Warm    Focused Exam: Heart: regular rate and rhythm      Lungs: Diffuse wheezing with decreased breath sounds    Lower extremities: FROM of right and left knee bilaterally with 5/5 strength   Initiation of care has begun. The patient has been counseled on the process, plan, and necessity for staying for the completion/evaluation, and the remainder of the medical screening examination    Recardo Evangelist, PA-C 07/21/18 1951    Isla Pence, MD 07/21/18 [phone]

## 2018-07-21 NOTE — ED Provider Notes (Signed)
[facility] Provider Note   CSN: [id number] Arrival date & time: 07/21/18  1857     History   Chief Complaint Chief Complaint  Patient presents with  . Extremity Weakness    HPI [name] [name] is a 77 y.o. male.  Patient is a 77 year old male with past medical history of diabetes, hypertension, COPD, prostate cancer.  He presents today for evaluation of weakness.  Over the past several weeks, the patient has had progressive weakness and difficulty with ambulation.  He was seen by his primary doctor and thought to be anemic.  He was given an iron transfusion several days ago, however this has not helped.  According to the son at bedside, the patient has had difficulty standing and ambulating.  When he attempts to walk, they state that he seems to lean to the left.  He denies any fevers or chills.  He denies any nausea, vomiting, or diarrhea.  He has been admitted in the past with similar presentations, and has had stays in a rehab facility.  The history is provided by the patient and a relative.    Past Medical History:  Diagnosis Date  . Anemia   . Arthritis   . Cancer ([place])   . COPD (chronic obstructive pulmonary disease) ([place])   . Diabetes mellitus   . Hyperlipidemia   . Hypertension   . Prostate cancer [facility])     Patient Active Problem List   Diagnosis Date Noted  . Pressure injury of skin 12/31/2017  . Cellulitis 12/29/2017  . Hypoglycemia 12/17/2017  . Leg weakness 11/21/2015  . Lumbar canal stenosis 11/21/2015  . Open wound of heel 02/15/2015  . History of surgical procedure 11/16/2014  . Chronic obstructive pulmonary disease ([place]) 10/31/2014  . Type 2 diabetes mellitus ([place]) 10/31/2014  . BP (high blood pressure) 10/31/2014  . Acquired cavovarus deformity of foot 04/19/2014  . Acquired equinus deformity of foot 04/19/2014  . Ankle contracture 03/28/2014  . Rhabdomyolysis 07/05/2013  . COPD exacerbation ([place])  07/05/2013  . H/O prostate cancer 07/04/2013  . Diabetes mellitus ([place]) 07/04/2013  . HTN (hypertension) 07/04/2013  . History of tobacco abuse 07/04/2013  . Fall 07/04/2013  . Thigh pain, musculoskeletal 07/04/2013  . Hypoxia 07/04/2013  . HLD (hyperlipidemia) 07/04/2013  . Depression 07/04/2013  . Chronic kidney disease 07/04/2013    Past Surgical History:  Procedure Laterality Date  . CATARACT EXTRACTION W/ INTRAOCULAR LENS IMPLANT  2007   bilateral  . FOOT SURGERY     left drop foot repair  . SHOULDER OPEN ROTATOR CUFF REPAIR  2010   left        Home Medications    Prior to Admission medications   Medication Sig Start Date End Date Taking? Authorizing Provider  albuterol (PROVENTIL) (2.5 MG/3ML) 0.083% nebulizer solution Take 3 mLs (2.5 mg total) by nebulization every 6 (six) hours. 12/20/17   Regalado, Belkys A, MD  aspirin 325 MG tablet Take 325 mg by mouth daily.    [provider]  atorvastatin (LIPITOR) 10 MG tablet Take 10 mg by mouth daily.    [provider]  budesonide-formoterol (SYMBICORT) 160-4.5 MCG/ACT inhaler Inhale 2 puffs into the lungs 2 (two) times daily. 07/07/13   Orson Eva, MD  busPIRone (BUSPAR) 10 MG tablet Take 10 mg by mouth 2 (two) times daily. 12/21/17   [provider]  cholecalciferol (VITAMIN D) 1000 UNITS tablet Take 1,000 Units by mouth daily.    [provider]  cyanocobalamin 100 MCG tablet Take 1 tablet (100 mcg total) by mouth daily. 12/21/17   Regalado, Belkys A, MD  doxycycline (VIBRA-TABS) 100 MG tablet Take 1 tablet (100 mg total) by mouth every 12 (twelve) hours. Patient not taking: Reported on 02/09/2018 01/03/18   Hosie Poisson, MD  ferrous sulfate 325 (65 FE) MG tablet Take 1 tablet (325 mg total) by mouth daily with breakfast. Take with Calcium 12/20/17   Regalado, Belkys A, MD  finasteride (PROSCAR) 5 MG tablet Take 5 mg by mouth daily.    [provider]  FLUoxetine (PROZAC) 20 MG capsule  Take 60 mg by mouth daily.  12/28/17   [provider]  Fluticasone Furoate-Vilanterol (BREO ELLIPTA IN) Inhale 1 Inhaler into the lungs daily.    [provider]  fluticasone furoate-vilanterol (BREO ELLIPTA) 100-25 MCG/INH AEPB Inhale 1 puff into the lungs daily.    [provider]  furosemide (LASIX) 20 MG tablet Take 20 mg by mouth daily.    [provider]  gabapentin (NEURONTIN) 300 MG capsule Take 300 mg by mouth at bedtime.    [provider]  glipiZIDE (GLUCOTROL) 5 MG tablet Take 0.5 tablets (2.5 mg total) by mouth 2 (two) times daily before a meal. Patient not taking: Reported on 02/09/2018 12/20/17   Regalado, Jerald Kief A, MD  hydroxypropyl methylcellulose (ISOPTO TEARS) 2.5 % ophthalmic solution Place 1 drop into both eyes as needed (for dryness).     [provider]  metFORMIN (GLUCOPHAGE) 1000 MG tablet Take 1,000 mg by mouth 2 (two) times daily with a meal.     [provider]  Multiple Vitamin (MULTIVITAMIN WITH MINERALS) TABS tablet Take 1 tablet by mouth daily. 12/21/17   Regalado, Belkys A, MD  nicotine (NICODERM CQ - DOSED IN MG/24 HR) 7 mg/24hr patch Place 1 patch (7 mg total) onto the skin daily. Patient not taking: Reported on 02/09/2018 12/21/17   Regalado, Jerald Kief A, MD  OVER THE COUNTER MEDICATION Apply 1 application topically 2 (two) times daily as needed (RASH). DIAPER/RASH CREAM    [provider]  predniSONE (DELTASONE) 20 MG tablet Take 1 tablet (20 mg total) by mouth daily with breakfast. Patient not taking: Reported on 02/09/2018 12/20/17   Regalado, Belkys A, MD  senna-docusate (SENOKOT-S) 8.6-50 MG tablet Take 1 tablet by mouth daily as needed for mild constipation. 12/20/17   Regalado, Belkys A, MD  tiotropium (SPIRIVA) 18 MCG inhalation capsule Place 18 mcg into inhaler and inhale daily.    [provider]  vitamin C (ASCORBIC ACID) 500 MG tablet Take 500 mg by mouth daily.    [provider]    Family History No family history on file.  Social History Social History   Tobacco Use  . Smoking status: Former Smoker    Last attempt to quit: 04/11/2005    Years since quitting: 13.2  . Smokeless tobacco: Never Used  Substance Use Topics  . Alcohol use: No    Comment: quit 8 weeks ago  . Drug use: No     Allergies   Oxycodone   Review of Systems Review of Systems  All other systems reviewed and are negative.    Physical Exam Updated Vital Signs BP 116/63   Pulse 76   Temp 98.2 F (36.8 C) (Oral)   Resp 16   Ht 5\' 11"  (1.803 m)   Wt 90.7 kg   SpO2 98%   BMI 27.89 kg/m   Physical  Exam  Constitutional: He is oriented to person, place, and time. He appears well-developed and well-nourished. No distress.  HENT:  Head: Normocephalic and atraumatic.  Mouth/Throat: Oropharynx is clear and moist.  Eyes: Pupils are equal, round, and reactive to light. EOM are normal.  Neck: Normal range of motion. Neck supple.  Cardiovascular: Normal rate and regular rhythm. Exam reveals no friction rub.  No murmur heard. Pulmonary/Chest: Effort normal and breath sounds normal. No respiratory distress. He has no wheezes. He has no rales.  Abdominal: Soft. Bowel sounds are normal. He exhibits no distension. There is no tenderness.  Musculoskeletal: Normal range of motion. He exhibits no edema.  Neurological: He is alert and oriented to person, place, and time. No cranial nerve deficit. He exhibits normal muscle tone. Coordination normal.  Skin: Skin is warm and dry. He is not diaphoretic.  Nursing note and vitals reviewed.    ED Treatments / Results  Labs (all labs ordered are listed, but only abnormal results are displayed) Labs Reviewed  BASIC METABOLIC PANEL - Abnormal; Notable for the following components:      Result Value   Potassium 5.6 (*)    Chloride 95 (*)    Glucose, Bld 113 (*)    Creatinine, Ser 1.62 (*)    Calcium 10.5 (*)    GFR calc non Af Amer 39  (*)    GFR calc Af Amer 45 (*)    All other components within normal limits  CBC - Abnormal; Notable for the following components:   Hemoglobin 10.3 (*)    HCT 35.7 (*)    MCH 23.5 (*)    MCHC 28.9 (*)    RDW 18.0 (*)    All other components within normal limits  CBG MONITORING, ED - Abnormal; Notable for the following components:   Glucose-Capillary 109 (*)    All other components within normal limits  URINALYSIS, ROUTINE W REFLEX MICROSCOPIC  TYPE AND SCREEN    EKG EKG Interpretation  Date/Time:  Thursday July 21 2018 19:37:01 EDT Ventricular Rate:  75 PR Interval:  146 QRS Duration: 88 QT Interval:  390 QTC Calculation: 435 R Axis:   66 Text Interpretation:  Normal sinus rhythm Low voltage QRS Cannot rule out Anterior infarct , age undetermined Abnormal ECG When comapred to prior, more wandering baseline.  No STEMI Confirmed by Antony Blackbird [id number]) on 07/21/2018 9:59:11 PM   Radiology No results found.  Procedures Procedures (including critical care time)  Medications Ordered in ED Medications  sodium chloride 0.9 % bolus 1,000 mL (has no administration in time range)  ipratropium-albuterol (DUONEB) 0.5-2.5 (3) MG/3ML nebulizer solution 3 mL (3 mLs Nebulization Given 07/21/18 1949)     Initial Impression / Assessment and Plan / ED Course  I have reviewed the triage vital signs and the nursing notes.  Pertinent labs & imaging results that were available during my care of the patient were reviewed by me and considered in my medical decision making (see chart for details).  Patient brought by family for increasing weakness over the past month.  I have have found no acute abnormality in the work-up today.  The patient appears to be having progressive decline in function at home and may benefit from home health or therapy.  Referrals to case management will be placed.  Patient will be discharged, to return as needed for any problems.  Final Clinical Impressions(s)  / ED Diagnoses   Final diagnoses:  None    ED Discharge Orders  None       Veryl Speak, MD 07/22/18 [phone]

## 2018-07-22 DIAGNOSIS — J449 Chronic obstructive pulmonary disease, unspecified: Secondary | ICD-10-CM | POA: Diagnosis not present

## 2018-07-22 DIAGNOSIS — I509 Heart failure, unspecified: Secondary | ICD-10-CM | POA: Diagnosis not present

## 2018-07-22 DIAGNOSIS — R0902 Hypoxemia: Secondary | ICD-10-CM | POA: Diagnosis not present

## 2018-07-22 DIAGNOSIS — M48061 Spinal stenosis, lumbar region without neurogenic claudication: Secondary | ICD-10-CM | POA: Diagnosis not present

## 2018-07-22 DIAGNOSIS — I130 Hypertensive heart and chronic kidney disease with heart failure and stage 1 through stage 4 chronic kidney disease, or unspecified chronic kidney disease: Secondary | ICD-10-CM | POA: Diagnosis not present

## 2018-07-22 DIAGNOSIS — E538 Deficiency of other specified B group vitamins: Secondary | ICD-10-CM | POA: Diagnosis not present

## 2018-07-22 DIAGNOSIS — N183 Chronic kidney disease, stage 3 (moderate): Secondary | ICD-10-CM | POA: Diagnosis not present

## 2018-07-22 DIAGNOSIS — E1142 Type 2 diabetes mellitus with diabetic polyneuropathy: Secondary | ICD-10-CM | POA: Diagnosis not present

## 2018-07-22 DIAGNOSIS — E1122 Type 2 diabetes mellitus with diabetic chronic kidney disease: Secondary | ICD-10-CM | POA: Diagnosis not present

## 2018-07-22 DIAGNOSIS — F0280 Dementia in other diseases classified elsewhere without behavioral disturbance: Secondary | ICD-10-CM | POA: Diagnosis not present

## 2018-07-22 LAB — URINALYSIS, ROUTINE W REFLEX MICROSCOPIC
BILIRUBIN URINE: NEGATIVE
GLUCOSE, UA: NEGATIVE mg/dL
Hgb urine dipstick: NEGATIVE
KETONES UR: NEGATIVE mg/dL
Leukocytes, UA: NEGATIVE
Nitrite: NEGATIVE
PH: 5 (ref 5.0–8.0)
Protein, ur: NEGATIVE mg/dL
Specific Gravity, Urine: 1.017 (ref 1.005–1.030)

## 2018-07-22 NOTE — Discharge Instructions (Addendum)
Follow-up with your primary doctor to discuss home health needs.  Return to the emergency department if symptoms significantly worsen or change.

## 2018-07-23 DIAGNOSIS — C61 Malignant neoplasm of prostate: Secondary | ICD-10-CM | POA: Diagnosis not present

## 2018-07-23 DIAGNOSIS — J449 Chronic obstructive pulmonary disease, unspecified: Secondary | ICD-10-CM | POA: Diagnosis not present

## 2018-07-23 DIAGNOSIS — I10 Essential (primary) hypertension: Secondary | ICD-10-CM | POA: Diagnosis not present

## 2018-07-23 DIAGNOSIS — M21372 Foot drop, left foot: Secondary | ICD-10-CM | POA: Diagnosis not present

## 2018-07-27 DIAGNOSIS — E538 Deficiency of other specified B group vitamins: Secondary | ICD-10-CM | POA: Diagnosis not present

## 2018-07-27 DIAGNOSIS — N183 Chronic kidney disease, stage 3 (moderate): Secondary | ICD-10-CM | POA: Diagnosis not present

## 2018-07-27 DIAGNOSIS — E1142 Type 2 diabetes mellitus with diabetic polyneuropathy: Secondary | ICD-10-CM | POA: Diagnosis not present

## 2018-07-27 DIAGNOSIS — E1122 Type 2 diabetes mellitus with diabetic chronic kidney disease: Secondary | ICD-10-CM | POA: Diagnosis not present

## 2018-07-27 DIAGNOSIS — M48061 Spinal stenosis, lumbar region without neurogenic claudication: Secondary | ICD-10-CM | POA: Diagnosis not present

## 2018-07-27 DIAGNOSIS — J449 Chronic obstructive pulmonary disease, unspecified: Secondary | ICD-10-CM | POA: Diagnosis not present

## 2018-07-27 DIAGNOSIS — I509 Heart failure, unspecified: Secondary | ICD-10-CM | POA: Diagnosis not present

## 2018-07-27 DIAGNOSIS — F0280 Dementia in other diseases classified elsewhere without behavioral disturbance: Secondary | ICD-10-CM | POA: Diagnosis not present

## 2018-07-27 DIAGNOSIS — I130 Hypertensive heart and chronic kidney disease with heart failure and stage 1 through stage 4 chronic kidney disease, or unspecified chronic kidney disease: Secondary | ICD-10-CM | POA: Diagnosis not present

## 2018-07-29 DIAGNOSIS — J449 Chronic obstructive pulmonary disease, unspecified: Secondary | ICD-10-CM | POA: Diagnosis not present

## 2018-07-29 DIAGNOSIS — E1122 Type 2 diabetes mellitus with diabetic chronic kidney disease: Secondary | ICD-10-CM | POA: Diagnosis not present

## 2018-07-29 DIAGNOSIS — I509 Heart failure, unspecified: Secondary | ICD-10-CM | POA: Diagnosis not present

## 2018-07-29 DIAGNOSIS — N183 Chronic kidney disease, stage 3 (moderate): Secondary | ICD-10-CM | POA: Diagnosis not present

## 2018-07-29 DIAGNOSIS — M48061 Spinal stenosis, lumbar region without neurogenic claudication: Secondary | ICD-10-CM | POA: Diagnosis not present

## 2018-07-29 DIAGNOSIS — E1142 Type 2 diabetes mellitus with diabetic polyneuropathy: Secondary | ICD-10-CM | POA: Diagnosis not present

## 2018-07-29 DIAGNOSIS — I130 Hypertensive heart and chronic kidney disease with heart failure and stage 1 through stage 4 chronic kidney disease, or unspecified chronic kidney disease: Secondary | ICD-10-CM | POA: Diagnosis not present

## 2018-07-29 DIAGNOSIS — E538 Deficiency of other specified B group vitamins: Secondary | ICD-10-CM | POA: Diagnosis not present

## 2018-07-29 DIAGNOSIS — F0280 Dementia in other diseases classified elsewhere without behavioral disturbance: Secondary | ICD-10-CM | POA: Diagnosis not present

## 2018-08-01 DIAGNOSIS — N183 Chronic kidney disease, stage 3 (moderate): Secondary | ICD-10-CM | POA: Diagnosis not present

## 2018-08-01 DIAGNOSIS — E1142 Type 2 diabetes mellitus with diabetic polyneuropathy: Secondary | ICD-10-CM | POA: Diagnosis not present

## 2018-08-01 DIAGNOSIS — I509 Heart failure, unspecified: Secondary | ICD-10-CM | POA: Diagnosis not present

## 2018-08-01 DIAGNOSIS — M48061 Spinal stenosis, lumbar region without neurogenic claudication: Secondary | ICD-10-CM | POA: Diagnosis not present

## 2018-08-01 DIAGNOSIS — I130 Hypertensive heart and chronic kidney disease with heart failure and stage 1 through stage 4 chronic kidney disease, or unspecified chronic kidney disease: Secondary | ICD-10-CM | POA: Diagnosis not present

## 2018-08-01 DIAGNOSIS — F0280 Dementia in other diseases classified elsewhere without behavioral disturbance: Secondary | ICD-10-CM | POA: Diagnosis not present

## 2018-08-01 DIAGNOSIS — E538 Deficiency of other specified B group vitamins: Secondary | ICD-10-CM | POA: Diagnosis not present

## 2018-08-01 DIAGNOSIS — J449 Chronic obstructive pulmonary disease, unspecified: Secondary | ICD-10-CM | POA: Diagnosis not present

## 2018-08-01 DIAGNOSIS — E1122 Type 2 diabetes mellitus with diabetic chronic kidney disease: Secondary | ICD-10-CM | POA: Diagnosis not present

## 2018-08-02 DIAGNOSIS — J449 Chronic obstructive pulmonary disease, unspecified: Secondary | ICD-10-CM | POA: Diagnosis not present

## 2018-08-03 DIAGNOSIS — M48061 Spinal stenosis, lumbar region without neurogenic claudication: Secondary | ICD-10-CM | POA: Diagnosis not present

## 2018-08-03 DIAGNOSIS — I509 Heart failure, unspecified: Secondary | ICD-10-CM | POA: Diagnosis not present

## 2018-08-03 DIAGNOSIS — E1122 Type 2 diabetes mellitus with diabetic chronic kidney disease: Secondary | ICD-10-CM | POA: Diagnosis not present

## 2018-08-03 DIAGNOSIS — J449 Chronic obstructive pulmonary disease, unspecified: Secondary | ICD-10-CM | POA: Diagnosis not present

## 2018-08-03 DIAGNOSIS — E538 Deficiency of other specified B group vitamins: Secondary | ICD-10-CM | POA: Diagnosis not present

## 2018-08-03 DIAGNOSIS — F0280 Dementia in other diseases classified elsewhere without behavioral disturbance: Secondary | ICD-10-CM | POA: Diagnosis not present

## 2018-08-03 DIAGNOSIS — N183 Chronic kidney disease, stage 3 (moderate): Secondary | ICD-10-CM | POA: Diagnosis not present

## 2018-08-03 DIAGNOSIS — I130 Hypertensive heart and chronic kidney disease with heart failure and stage 1 through stage 4 chronic kidney disease, or unspecified chronic kidney disease: Secondary | ICD-10-CM | POA: Diagnosis not present

## 2018-08-03 DIAGNOSIS — E1142 Type 2 diabetes mellitus with diabetic polyneuropathy: Secondary | ICD-10-CM | POA: Diagnosis not present

## 2018-08-04 DIAGNOSIS — M48061 Spinal stenosis, lumbar region without neurogenic claudication: Secondary | ICD-10-CM | POA: Diagnosis not present

## 2018-08-04 DIAGNOSIS — E1122 Type 2 diabetes mellitus with diabetic chronic kidney disease: Secondary | ICD-10-CM | POA: Diagnosis not present

## 2018-08-04 DIAGNOSIS — E538 Deficiency of other specified B group vitamins: Secondary | ICD-10-CM | POA: Diagnosis not present

## 2018-08-04 DIAGNOSIS — I509 Heart failure, unspecified: Secondary | ICD-10-CM | POA: Diagnosis not present

## 2018-08-04 DIAGNOSIS — F0280 Dementia in other diseases classified elsewhere without behavioral disturbance: Secondary | ICD-10-CM | POA: Diagnosis not present

## 2018-08-04 DIAGNOSIS — N183 Chronic kidney disease, stage 3 (moderate): Secondary | ICD-10-CM | POA: Diagnosis not present

## 2018-08-04 DIAGNOSIS — E1142 Type 2 diabetes mellitus with diabetic polyneuropathy: Secondary | ICD-10-CM | POA: Diagnosis not present

## 2018-08-04 DIAGNOSIS — J449 Chronic obstructive pulmonary disease, unspecified: Secondary | ICD-10-CM | POA: Diagnosis not present

## 2018-08-04 DIAGNOSIS — I130 Hypertensive heart and chronic kidney disease with heart failure and stage 1 through stage 4 chronic kidney disease, or unspecified chronic kidney disease: Secondary | ICD-10-CM | POA: Diagnosis not present

## 2018-08-05 DIAGNOSIS — N183 Chronic kidney disease, stage 3 (moderate): Secondary | ICD-10-CM | POA: Diagnosis not present

## 2018-08-05 DIAGNOSIS — E538 Deficiency of other specified B group vitamins: Secondary | ICD-10-CM | POA: Diagnosis not present

## 2018-08-05 DIAGNOSIS — I130 Hypertensive heart and chronic kidney disease with heart failure and stage 1 through stage 4 chronic kidney disease, or unspecified chronic kidney disease: Secondary | ICD-10-CM | POA: Diagnosis not present

## 2018-08-05 DIAGNOSIS — I509 Heart failure, unspecified: Secondary | ICD-10-CM | POA: Diagnosis not present

## 2018-08-05 DIAGNOSIS — F0280 Dementia in other diseases classified elsewhere without behavioral disturbance: Secondary | ICD-10-CM | POA: Diagnosis not present

## 2018-08-05 DIAGNOSIS — E1122 Type 2 diabetes mellitus with diabetic chronic kidney disease: Secondary | ICD-10-CM | POA: Diagnosis not present

## 2018-08-05 DIAGNOSIS — J449 Chronic obstructive pulmonary disease, unspecified: Secondary | ICD-10-CM | POA: Diagnosis not present

## 2018-08-05 DIAGNOSIS — E1142 Type 2 diabetes mellitus with diabetic polyneuropathy: Secondary | ICD-10-CM | POA: Diagnosis not present

## 2018-08-05 DIAGNOSIS — M48061 Spinal stenosis, lumbar region without neurogenic claudication: Secondary | ICD-10-CM | POA: Diagnosis not present

## 2018-08-08 DIAGNOSIS — J449 Chronic obstructive pulmonary disease, unspecified: Secondary | ICD-10-CM | POA: Diagnosis not present

## 2018-08-08 DIAGNOSIS — I130 Hypertensive heart and chronic kidney disease with heart failure and stage 1 through stage 4 chronic kidney disease, or unspecified chronic kidney disease: Secondary | ICD-10-CM | POA: Diagnosis not present

## 2018-08-08 DIAGNOSIS — E538 Deficiency of other specified B group vitamins: Secondary | ICD-10-CM | POA: Diagnosis not present

## 2018-08-08 DIAGNOSIS — M48061 Spinal stenosis, lumbar region without neurogenic claudication: Secondary | ICD-10-CM | POA: Diagnosis not present

## 2018-08-08 DIAGNOSIS — E1142 Type 2 diabetes mellitus with diabetic polyneuropathy: Secondary | ICD-10-CM | POA: Diagnosis not present

## 2018-08-08 DIAGNOSIS — F0280 Dementia in other diseases classified elsewhere without behavioral disturbance: Secondary | ICD-10-CM | POA: Diagnosis not present

## 2018-08-08 DIAGNOSIS — I509 Heart failure, unspecified: Secondary | ICD-10-CM | POA: Diagnosis not present

## 2018-08-08 DIAGNOSIS — E1122 Type 2 diabetes mellitus with diabetic chronic kidney disease: Secondary | ICD-10-CM | POA: Diagnosis not present

## 2018-08-08 DIAGNOSIS — N183 Chronic kidney disease, stage 3 (moderate): Secondary | ICD-10-CM | POA: Diagnosis not present

## 2018-08-09 DIAGNOSIS — E538 Deficiency of other specified B group vitamins: Secondary | ICD-10-CM | POA: Diagnosis not present

## 2018-08-09 DIAGNOSIS — E1122 Type 2 diabetes mellitus with diabetic chronic kidney disease: Secondary | ICD-10-CM | POA: Diagnosis not present

## 2018-08-09 DIAGNOSIS — N183 Chronic kidney disease, stage 3 (moderate): Secondary | ICD-10-CM | POA: Diagnosis not present

## 2018-08-09 DIAGNOSIS — E1142 Type 2 diabetes mellitus with diabetic polyneuropathy: Secondary | ICD-10-CM | POA: Diagnosis not present

## 2018-08-09 DIAGNOSIS — M48061 Spinal stenosis, lumbar region without neurogenic claudication: Secondary | ICD-10-CM | POA: Diagnosis not present

## 2018-08-09 DIAGNOSIS — I130 Hypertensive heart and chronic kidney disease with heart failure and stage 1 through stage 4 chronic kidney disease, or unspecified chronic kidney disease: Secondary | ICD-10-CM | POA: Diagnosis not present

## 2018-08-09 DIAGNOSIS — J449 Chronic obstructive pulmonary disease, unspecified: Secondary | ICD-10-CM | POA: Diagnosis not present

## 2018-08-09 DIAGNOSIS — I509 Heart failure, unspecified: Secondary | ICD-10-CM | POA: Diagnosis not present

## 2018-08-09 DIAGNOSIS — F0280 Dementia in other diseases classified elsewhere without behavioral disturbance: Secondary | ICD-10-CM | POA: Diagnosis not present

## 2018-08-10 DIAGNOSIS — E1122 Type 2 diabetes mellitus with diabetic chronic kidney disease: Secondary | ICD-10-CM | POA: Diagnosis not present

## 2018-08-10 DIAGNOSIS — J449 Chronic obstructive pulmonary disease, unspecified: Secondary | ICD-10-CM | POA: Diagnosis not present

## 2018-08-10 DIAGNOSIS — I130 Hypertensive heart and chronic kidney disease with heart failure and stage 1 through stage 4 chronic kidney disease, or unspecified chronic kidney disease: Secondary | ICD-10-CM | POA: Diagnosis not present

## 2018-08-10 DIAGNOSIS — I509 Heart failure, unspecified: Secondary | ICD-10-CM | POA: Diagnosis not present

## 2018-08-10 DIAGNOSIS — M48061 Spinal stenosis, lumbar region without neurogenic claudication: Secondary | ICD-10-CM | POA: Diagnosis not present

## 2018-08-10 DIAGNOSIS — E538 Deficiency of other specified B group vitamins: Secondary | ICD-10-CM | POA: Diagnosis not present

## 2018-08-10 DIAGNOSIS — F0280 Dementia in other diseases classified elsewhere without behavioral disturbance: Secondary | ICD-10-CM | POA: Diagnosis not present

## 2018-08-10 DIAGNOSIS — E1142 Type 2 diabetes mellitus with diabetic polyneuropathy: Secondary | ICD-10-CM | POA: Diagnosis not present

## 2018-08-10 DIAGNOSIS — N183 Chronic kidney disease, stage 3 (moderate): Secondary | ICD-10-CM | POA: Diagnosis not present

## 2018-08-12 DIAGNOSIS — I130 Hypertensive heart and chronic kidney disease with heart failure and stage 1 through stage 4 chronic kidney disease, or unspecified chronic kidney disease: Secondary | ICD-10-CM | POA: Diagnosis not present

## 2018-08-12 DIAGNOSIS — E538 Deficiency of other specified B group vitamins: Secondary | ICD-10-CM | POA: Diagnosis not present

## 2018-08-12 DIAGNOSIS — M48061 Spinal stenosis, lumbar region without neurogenic claudication: Secondary | ICD-10-CM | POA: Diagnosis not present

## 2018-08-12 DIAGNOSIS — E1142 Type 2 diabetes mellitus with diabetic polyneuropathy: Secondary | ICD-10-CM | POA: Diagnosis not present

## 2018-08-12 DIAGNOSIS — E1122 Type 2 diabetes mellitus with diabetic chronic kidney disease: Secondary | ICD-10-CM | POA: Diagnosis not present

## 2018-08-12 DIAGNOSIS — I509 Heart failure, unspecified: Secondary | ICD-10-CM | POA: Diagnosis not present

## 2018-08-12 DIAGNOSIS — F0280 Dementia in other diseases classified elsewhere without behavioral disturbance: Secondary | ICD-10-CM | POA: Diagnosis not present

## 2018-08-12 DIAGNOSIS — N183 Chronic kidney disease, stage 3 (moderate): Secondary | ICD-10-CM | POA: Diagnosis not present

## 2018-08-12 DIAGNOSIS — J449 Chronic obstructive pulmonary disease, unspecified: Secondary | ICD-10-CM | POA: Diagnosis not present

## 2018-08-15 DIAGNOSIS — E1122 Type 2 diabetes mellitus with diabetic chronic kidney disease: Secondary | ICD-10-CM | POA: Diagnosis not present

## 2018-08-15 DIAGNOSIS — J449 Chronic obstructive pulmonary disease, unspecified: Secondary | ICD-10-CM | POA: Diagnosis not present

## 2018-08-15 DIAGNOSIS — M48061 Spinal stenosis, lumbar region without neurogenic claudication: Secondary | ICD-10-CM | POA: Diagnosis not present

## 2018-08-15 DIAGNOSIS — E538 Deficiency of other specified B group vitamins: Secondary | ICD-10-CM | POA: Diagnosis not present

## 2018-08-15 DIAGNOSIS — I509 Heart failure, unspecified: Secondary | ICD-10-CM | POA: Diagnosis not present

## 2018-08-15 DIAGNOSIS — N183 Chronic kidney disease, stage 3 (moderate): Secondary | ICD-10-CM | POA: Diagnosis not present

## 2018-08-15 DIAGNOSIS — I130 Hypertensive heart and chronic kidney disease with heart failure and stage 1 through stage 4 chronic kidney disease, or unspecified chronic kidney disease: Secondary | ICD-10-CM | POA: Diagnosis not present

## 2018-08-15 DIAGNOSIS — F0280 Dementia in other diseases classified elsewhere without behavioral disturbance: Secondary | ICD-10-CM | POA: Diagnosis not present

## 2018-08-15 DIAGNOSIS — E1142 Type 2 diabetes mellitus with diabetic polyneuropathy: Secondary | ICD-10-CM | POA: Diagnosis not present

## 2018-08-17 DIAGNOSIS — E1122 Type 2 diabetes mellitus with diabetic chronic kidney disease: Secondary | ICD-10-CM | POA: Diagnosis not present

## 2018-08-17 DIAGNOSIS — I130 Hypertensive heart and chronic kidney disease with heart failure and stage 1 through stage 4 chronic kidney disease, or unspecified chronic kidney disease: Secondary | ICD-10-CM | POA: Diagnosis not present

## 2018-08-17 DIAGNOSIS — E538 Deficiency of other specified B group vitamins: Secondary | ICD-10-CM | POA: Diagnosis not present

## 2018-08-17 DIAGNOSIS — N183 Chronic kidney disease, stage 3 (moderate): Secondary | ICD-10-CM | POA: Diagnosis not present

## 2018-08-17 DIAGNOSIS — I509 Heart failure, unspecified: Secondary | ICD-10-CM | POA: Diagnosis not present

## 2018-08-17 DIAGNOSIS — M48061 Spinal stenosis, lumbar region without neurogenic claudication: Secondary | ICD-10-CM | POA: Diagnosis not present

## 2018-08-17 DIAGNOSIS — J449 Chronic obstructive pulmonary disease, unspecified: Secondary | ICD-10-CM | POA: Diagnosis not present

## 2018-08-17 DIAGNOSIS — F0280 Dementia in other diseases classified elsewhere without behavioral disturbance: Secondary | ICD-10-CM | POA: Diagnosis not present

## 2018-08-17 DIAGNOSIS — E1142 Type 2 diabetes mellitus with diabetic polyneuropathy: Secondary | ICD-10-CM | POA: Diagnosis not present

## 2018-08-18 DIAGNOSIS — E538 Deficiency of other specified B group vitamins: Secondary | ICD-10-CM | POA: Diagnosis not present

## 2018-08-18 DIAGNOSIS — I10 Essential (primary) hypertension: Secondary | ICD-10-CM | POA: Diagnosis not present

## 2018-08-18 DIAGNOSIS — J449 Chronic obstructive pulmonary disease, unspecified: Secondary | ICD-10-CM | POA: Diagnosis not present

## 2018-08-18 DIAGNOSIS — I130 Hypertensive heart and chronic kidney disease with heart failure and stage 1 through stage 4 chronic kidney disease, or unspecified chronic kidney disease: Secondary | ICD-10-CM | POA: Diagnosis not present

## 2018-08-18 DIAGNOSIS — F0390 Unspecified dementia without behavioral disturbance: Secondary | ICD-10-CM | POA: Diagnosis not present

## 2018-08-18 DIAGNOSIS — E1122 Type 2 diabetes mellitus with diabetic chronic kidney disease: Secondary | ICD-10-CM | POA: Diagnosis not present

## 2018-08-18 DIAGNOSIS — D508 Other iron deficiency anemias: Secondary | ICD-10-CM | POA: Diagnosis not present

## 2018-08-18 DIAGNOSIS — E1142 Type 2 diabetes mellitus with diabetic polyneuropathy: Secondary | ICD-10-CM | POA: Diagnosis not present

## 2018-08-18 DIAGNOSIS — F39 Unspecified mood [affective] disorder: Secondary | ICD-10-CM | POA: Diagnosis not present

## 2018-08-18 DIAGNOSIS — I509 Heart failure, unspecified: Secondary | ICD-10-CM | POA: Diagnosis not present

## 2018-08-18 DIAGNOSIS — M48061 Spinal stenosis, lumbar region without neurogenic claudication: Secondary | ICD-10-CM | POA: Diagnosis not present

## 2018-08-18 DIAGNOSIS — M6289 Other specified disorders of muscle: Secondary | ICD-10-CM | POA: Diagnosis not present

## 2018-08-18 DIAGNOSIS — D649 Anemia, unspecified: Secondary | ICD-10-CM | POA: Diagnosis not present

## 2018-08-18 DIAGNOSIS — N183 Chronic kidney disease, stage 3 (moderate): Secondary | ICD-10-CM | POA: Diagnosis not present

## 2018-08-18 DIAGNOSIS — F0280 Dementia in other diseases classified elsewhere without behavioral disturbance: Secondary | ICD-10-CM | POA: Diagnosis not present

## 2018-08-18 DIAGNOSIS — Z23 Encounter for immunization: Secondary | ICD-10-CM | POA: Diagnosis not present

## 2018-08-22 DIAGNOSIS — J449 Chronic obstructive pulmonary disease, unspecified: Secondary | ICD-10-CM | POA: Diagnosis not present

## 2018-08-22 DIAGNOSIS — R0902 Hypoxemia: Secondary | ICD-10-CM | POA: Diagnosis not present

## 2018-08-23 DIAGNOSIS — I10 Essential (primary) hypertension: Secondary | ICD-10-CM | POA: Diagnosis not present

## 2018-08-23 DIAGNOSIS — C61 Malignant neoplasm of prostate: Secondary | ICD-10-CM | POA: Diagnosis not present

## 2018-08-23 DIAGNOSIS — M21372 Foot drop, left foot: Secondary | ICD-10-CM | POA: Diagnosis not present

## 2018-08-23 DIAGNOSIS — J449 Chronic obstructive pulmonary disease, unspecified: Secondary | ICD-10-CM | POA: Diagnosis not present

## 2018-09-02 DIAGNOSIS — J449 Chronic obstructive pulmonary disease, unspecified: Secondary | ICD-10-CM | POA: Diagnosis not present

## 2018-09-21 DIAGNOSIS — J449 Chronic obstructive pulmonary disease, unspecified: Secondary | ICD-10-CM | POA: Diagnosis not present

## 2018-09-21 DIAGNOSIS — R0902 Hypoxemia: Secondary | ICD-10-CM | POA: Diagnosis not present

## 2018-09-22 DIAGNOSIS — C61 Malignant neoplasm of prostate: Secondary | ICD-10-CM | POA: Diagnosis not present

## 2018-09-22 DIAGNOSIS — I10 Essential (primary) hypertension: Secondary | ICD-10-CM | POA: Diagnosis not present

## 2018-09-22 DIAGNOSIS — J449 Chronic obstructive pulmonary disease, unspecified: Secondary | ICD-10-CM | POA: Diagnosis not present

## 2018-09-22 DIAGNOSIS — M21372 Foot drop, left foot: Secondary | ICD-10-CM | POA: Diagnosis not present

## 2018-09-30 ENCOUNTER — Ambulatory Visit: Payer: Medicare HMO | Admitting: Podiatry

## 2018-10-02 DIAGNOSIS — J449 Chronic obstructive pulmonary disease, unspecified: Secondary | ICD-10-CM | POA: Diagnosis not present

## 2018-10-11 ENCOUNTER — Encounter: Payer: Self-pay | Admitting: Podiatry

## 2018-10-11 ENCOUNTER — Ambulatory Visit (INDEPENDENT_AMBULATORY_CARE_PROVIDER_SITE_OTHER): Payer: Medicare HMO | Admitting: Podiatry

## 2018-10-11 VITALS — BP 151/59

## 2018-10-11 DIAGNOSIS — M79676 Pain in unspecified toe(s): Secondary | ICD-10-CM | POA: Diagnosis not present

## 2018-10-11 DIAGNOSIS — B351 Tinea unguium: Secondary | ICD-10-CM | POA: Diagnosis not present

## 2018-10-11 NOTE — Patient Instructions (Signed)
Diabetes Mellitus and Foot Care  Foot care is an important part of your health, especially when you have diabetes. Diabetes may cause you to have problems because of poor blood flow (circulation) to your feet and legs, which can cause your skin to:   Become thinner and drier.   Break more easily.   Heal more slowly.   Peel and crack.  You may also have nerve damage (neuropathy) in your legs and feet, causing decreased feeling in them. This means that you may not notice minor injuries to your feet that could lead to more serious problems. Noticing and addressing any potential problems early is the best way to prevent future foot problems.  How to care for your feet  Foot hygiene   Wash your feet daily with warm water and mild soap. Do not use hot water. Then, pat your feet and the areas between your toes until they are completely dry. Do not soak your feet as this can dry your skin.   Trim your toenails straight across. Do not dig under them or around the cuticle. File the edges of your nails with an emery board or nail file.   Apply a moisturizing lotion or petroleum jelly to the skin on your feet and to dry, brittle toenails. Use lotion that does not contain alcohol and is unscented. Do not apply lotion between your toes.  Shoes and socks   Wear clean socks or stockings every day. Make sure they are not too tight. Do not wear knee-high stockings since they may decrease blood flow to your legs.   Wear shoes that fit properly and have enough cushioning. Always look in your shoes before you put them on to be sure there are no objects inside.   To break in new shoes, wear them for just a few hours a day. This prevents injuries on your feet.  Wounds, scrapes, corns, and calluses   Check your feet daily for blisters, cuts, bruises, sores, and redness. If you cannot see the bottom of your feet, use a mirror or ask someone for help.   Do not cut corns or calluses or try to remove them with medicine.   If you  find a minor scrape, cut, or break in the skin on your feet, keep it and the skin around it clean and dry. You may clean these areas with mild soap and water. Do not clean the area with peroxide, alcohol, or iodine.   If you have a wound, scrape, corn, or callus on your foot, look at it several times a day to make sure it is healing and not infected. Check for:  ? Redness, swelling, or pain.  ? Fluid or blood.  ? Warmth.  ? Pus or a bad smell.  General instructions   Do not cross your legs. This may decrease blood flow to your feet.   Do not use heating pads or hot water bottles on your feet. They may burn your skin. If you have lost feeling in your feet or legs, you may not know this is happening until it is too late.   Protect your feet from hot and cold by wearing shoes, such as at the beach or on hot pavement.   Schedule a complete foot exam at least once a year (annually) or more often if you have foot problems. If you have foot problems, report any cuts, sores, or bruises to your health care provider immediately.  Contact a health care provider if:     You have a medical condition that increases your risk of infection and you have any cuts, sores, or bruises on your feet.   You have an injury that is not healing.   You have redness on your legs or feet.   You feel burning or tingling in your legs or feet.   You have pain or cramps in your legs and feet.   Your legs or feet are numb.   Your feet always feel cold.   You have pain around a toenail.  Get help right away if:   You have a wound, scrape, corn, or callus on your foot and:  ? You have pain, swelling, or redness that gets worse.  ? You have fluid or blood coming from the wound, scrape, corn, or callus.  ? Your wound, scrape, corn, or callus feels warm to the touch.  ? You have pus or a bad smell coming from the wound, scrape, corn, or callus.  ? You have a fever.  ? You have a red line going up your leg.  Summary   Check your feet every day  for cuts, sores, red spots, swelling, and blisters.   Moisturize feet and legs daily.   Wear shoes that fit properly and have enough cushioning.   If you have foot problems, report any cuts, sores, or bruises to your health care provider immediately.   Schedule a complete foot exam at least once a year (annually) or more often if you have foot problems.  This information is not intended to replace advice given to you by your health care provider. Make sure you discuss any questions you have with your health care provider.  Document Released: 09/18/2000 Document Revised: 11/03/2017 Document Reviewed: 10/23/2016  Elsevier Interactive Patient Education  2019 [employer] Inc[employer]

## 2018-10-22 DIAGNOSIS — J449 Chronic obstructive pulmonary disease, unspecified: Secondary | ICD-10-CM | POA: Diagnosis not present

## 2018-10-22 DIAGNOSIS — R0902 Hypoxemia: Secondary | ICD-10-CM | POA: Diagnosis not present

## 2018-10-23 DIAGNOSIS — J449 Chronic obstructive pulmonary disease, unspecified: Secondary | ICD-10-CM | POA: Diagnosis not present

## 2018-10-23 DIAGNOSIS — M21372 Foot drop, left foot: Secondary | ICD-10-CM | POA: Diagnosis not present

## 2018-10-23 DIAGNOSIS — I10 Essential (primary) hypertension: Secondary | ICD-10-CM | POA: Diagnosis not present

## 2018-10-23 DIAGNOSIS — C61 Malignant neoplasm of prostate: Secondary | ICD-10-CM | POA: Diagnosis not present

## 2018-11-01 ENCOUNTER — Encounter: Payer: Self-pay | Admitting: Podiatry

## 2018-11-01 NOTE — Progress Notes (Signed)
Subjective: [name] [name] presents today with painful, thick toenails 1-5 b/l that he cannot cut and which interfere with daily activities.  Pain is aggravated when wearing enclosed shoe gear.  Mr. [name] relates left great toenail came off about 2 weeks ago.  He relates his daughter cleaned it with hydrogen peroxide and applied Neosporin.  Prince Solian, MD is his PCP.   Current Outpatient Medications:  .  albuterol (PROVENTIL) (2.5 MG/3ML) 0.083% nebulizer solution, Take 3 mLs (2.5 mg total) by nebulization every 6 (six) hours., Disp: 75 mL, Rfl: 1 .  aspirin 325 MG tablet, Take 325 mg by mouth daily., Disp: , Rfl:  .  atorvastatin (LIPITOR) 10 MG tablet, Take 10 mg by mouth daily., Disp: , Rfl:  .  budesonide-formoterol (SYMBICORT) 160-4.5 MCG/ACT inhaler, Inhale 2 puffs into the lungs 2 (two) times daily., Disp: 1 Inhaler, Rfl: 2 .  cholecalciferol (VITAMIN D) 1000 UNITS tablet, Take 1,000 Units by mouth daily., Disp: , Rfl:  .  cyanocobalamin 100 MCG tablet, Take 1 tablet (100 mcg total) by mouth daily., Disp: 20 tablet, Rfl: 0 .  divalproex (DEPAKOTE) 250 MG DR tablet, Take 250 mg by mouth 2 (two) times daily., Disp: , Rfl:  .  doxycycline (VIBRA-TABS) 100 MG tablet, Take 1 tablet (100 mg total) by mouth every 12 (twelve) hours., Disp: 10 tablet, Rfl: 0 .  ferrous sulfate 325 (65 FE) MG tablet, Take 1 tablet (325 mg total) by mouth daily with breakfast. Take with Calcium (Patient taking differently: Take 325 mg by mouth 2 (two) times daily with a meal. ), Disp: 60 tablet, Rfl: 0 .  finasteride (PROSCAR) 5 MG tablet, Take 5 mg by mouth daily., Disp: , Rfl:  .  FLUoxetine (PROZAC) 20 MG capsule, Take 60 mg by mouth daily. , Disp: , Rfl:  .  fluticasone furoate-vilanterol (BREO ELLIPTA) 100-25 MCG/INH AEPB, Inhale 1 puff into the lungs daily., Disp: , Rfl:  .  furosemide (LASIX) 20 MG tablet, Take 20 mg by mouth daily., Disp: , Rfl:  .  gabapentin (NEURONTIN) 300 MG capsule, Take 300 mg  by mouth at bedtime., Disp: , Rfl:  .  glipiZIDE (GLUCOTROL) 5 MG tablet, Take 0.5 tablets (2.5 mg total) by mouth 2 (two) times daily before a meal., Disp: 60 tablet, Rfl: 0 .  metFORMIN (GLUCOPHAGE) 1000 MG tablet, Take 1,000 mg by mouth 2 (two) times daily with a meal. , Disp: , Rfl:  .  Multiple Vitamin (MULTIVITAMIN WITH MINERALS) TABS tablet, Take 1 tablet by mouth daily., Disp: 30 tablet, Rfl: 0 .  nicotine (NICODERM CQ - DOSED IN MG/24 HR) 7 mg/24hr patch, Place 1 patch (7 mg total) onto the skin daily., Disp: 28 patch, Rfl: 0 .  predniSONE (DELTASONE) 20 MG tablet, Take 1 tablet (20 mg total) by mouth daily with breakfast., Disp: 3 tablet, Rfl: 0 .  senna-docusate (SENOKOT-S) 8.6-50 MG tablet, Take 1 tablet by mouth daily as needed for mild constipation., Disp: 10 tablet, Rfl: 0 .  tiotropium (SPIRIVA) 18 MCG inhalation capsule, Place 18 mcg into inhaler and inhale daily., Disp: , Rfl:  .  vitamin C (ASCORBIC ACID) 500 MG tablet, Take 500 mg by mouth daily., Disp: , Rfl:   Allergies  Allergen Reactions  . Oxycodone Itching and Other (See Comments)    Only intolerant when takes with Diphenhydramine, altered mental status    Objective:  Vascular Examination: Capillary refill time less than 3 seconds x 10 digits  Dorsalis pedis 1/4 bilaterally  Posterior tibial pulses 1/4 b/l  Digital hair sparse x 10 digits  Skin temperature gradient WNL b/l  Dermatological Examination: Skin with normal turgor, texture and tone b/l  Toenails 1-5 right, 2 through 5 left are noted to be discolored, thick, dystrophic with subungual debris and pain with palpation to nailbeds due to thickness of nails.  The left fourth digit toenail is mycotic and crumbly and exhibits onycholysis of entire nail plate.  Underlying nailbed is completely epithelialized with no edema, no erythema, no drainage. Musculoskeletal: Muscle strength 5/5 to all LE muscle groups  No gross bony deformities b/l.  No pain,  crepitus or joint limitation noted with ROM.   Neurological: Sensation intact with 10 gram monofilament. Vibratory sensation intact.  Assessment: Painful onychomycosis toenails 1 through 5 right, 2 through 5 left  Plan: 1. Toenails 1 through 5 right foot, 2 through 5 left foot were debrided in length and girth without iatrogenic bleeding.  The left fourth toe was debrided as mentioned above. 2. Patient to continue soft, supportive shoe gear 3. Patient to report any pedal injuries to medical professional immediately. 4. Follow up 3 months at [facility] office per patient request on today. 5. Patient/POA to call should there be a concern in the interim.

## 2018-11-02 DIAGNOSIS — J449 Chronic obstructive pulmonary disease, unspecified: Secondary | ICD-10-CM | POA: Diagnosis not present

## 2018-11-14 DIAGNOSIS — Z125 Encounter for screening for malignant neoplasm of prostate: Secondary | ICD-10-CM | POA: Diagnosis not present

## 2018-11-14 DIAGNOSIS — M810 Age-related osteoporosis without current pathological fracture: Secondary | ICD-10-CM | POA: Diagnosis not present

## 2018-11-14 DIAGNOSIS — E1122 Type 2 diabetes mellitus with diabetic chronic kidney disease: Secondary | ICD-10-CM | POA: Diagnosis not present

## 2018-11-14 DIAGNOSIS — E782 Mixed hyperlipidemia: Secondary | ICD-10-CM | POA: Diagnosis not present

## 2018-11-14 DIAGNOSIS — D508 Other iron deficiency anemias: Secondary | ICD-10-CM | POA: Diagnosis not present

## 2018-11-21 DIAGNOSIS — C61 Malignant neoplasm of prostate: Secondary | ICD-10-CM | POA: Diagnosis not present

## 2018-11-21 DIAGNOSIS — N183 Chronic kidney disease, stage 3 (moderate): Secondary | ICD-10-CM | POA: Diagnosis not present

## 2018-11-21 DIAGNOSIS — E782 Mixed hyperlipidemia: Secondary | ICD-10-CM | POA: Diagnosis not present

## 2018-11-21 DIAGNOSIS — E1122 Type 2 diabetes mellitus with diabetic chronic kidney disease: Secondary | ICD-10-CM | POA: Diagnosis not present

## 2018-11-21 DIAGNOSIS — J449 Chronic obstructive pulmonary disease, unspecified: Secondary | ICD-10-CM | POA: Diagnosis not present

## 2018-11-21 DIAGNOSIS — I10 Essential (primary) hypertension: Secondary | ICD-10-CM | POA: Diagnosis not present

## 2018-11-21 DIAGNOSIS — E668 Other obesity: Secondary | ICD-10-CM | POA: Diagnosis not present

## 2018-11-21 DIAGNOSIS — Z0000 Encounter for general adult medical examination without abnormal findings: Secondary | ICD-10-CM | POA: Diagnosis not present

## 2018-11-21 DIAGNOSIS — R82998 Other abnormal findings in urine: Secondary | ICD-10-CM | POA: Diagnosis not present

## 2018-11-21 DIAGNOSIS — Z9981 Dependence on supplemental oxygen: Secondary | ICD-10-CM | POA: Diagnosis not present

## 2018-11-22 DIAGNOSIS — J449 Chronic obstructive pulmonary disease, unspecified: Secondary | ICD-10-CM | POA: Diagnosis not present

## 2018-11-22 DIAGNOSIS — R0902 Hypoxemia: Secondary | ICD-10-CM | POA: Diagnosis not present

## 2018-11-23 DIAGNOSIS — C61 Malignant neoplasm of prostate: Secondary | ICD-10-CM | POA: Diagnosis not present

## 2018-11-23 DIAGNOSIS — M21372 Foot drop, left foot: Secondary | ICD-10-CM | POA: Diagnosis not present

## 2018-11-23 DIAGNOSIS — J449 Chronic obstructive pulmonary disease, unspecified: Secondary | ICD-10-CM | POA: Diagnosis not present

## 2018-11-23 DIAGNOSIS — I10 Essential (primary) hypertension: Secondary | ICD-10-CM | POA: Diagnosis not present

## 2018-12-03 DIAGNOSIS — J449 Chronic obstructive pulmonary disease, unspecified: Secondary | ICD-10-CM | POA: Diagnosis not present

## 2018-12-22 DIAGNOSIS — C61 Malignant neoplasm of prostate: Secondary | ICD-10-CM | POA: Diagnosis not present

## 2018-12-22 DIAGNOSIS — J449 Chronic obstructive pulmonary disease, unspecified: Secondary | ICD-10-CM | POA: Diagnosis not present

## 2018-12-22 DIAGNOSIS — M21372 Foot drop, left foot: Secondary | ICD-10-CM | POA: Diagnosis not present

## 2018-12-22 DIAGNOSIS — I10 Essential (primary) hypertension: Secondary | ICD-10-CM | POA: Diagnosis not present

## 2018-12-22 DIAGNOSIS — E11621 Type 2 diabetes mellitus with foot ulcer: Secondary | ICD-10-CM | POA: Diagnosis not present

## 2018-12-22 DIAGNOSIS — L97419 Non-pressure chronic ulcer of right heel and midfoot with unspecified severity: Secondary | ICD-10-CM | POA: Diagnosis not present

## 2018-12-23 ENCOUNTER — Other Ambulatory Visit: Payer: Self-pay

## 2018-12-23 ENCOUNTER — Encounter (INDEPENDENT_AMBULATORY_CARE_PROVIDER_SITE_OTHER): Payer: Self-pay | Admitting: Orthopedic Surgery

## 2018-12-23 ENCOUNTER — Ambulatory Visit (INDEPENDENT_AMBULATORY_CARE_PROVIDER_SITE_OTHER): Payer: Medicare HMO | Admitting: Orthopedic Surgery

## 2018-12-23 VITALS — Ht 71.0 in | Wt 200.0 lb

## 2018-12-23 DIAGNOSIS — E1142 Type 2 diabetes mellitus with diabetic polyneuropathy: Secondary | ICD-10-CM | POA: Diagnosis not present

## 2018-12-23 DIAGNOSIS — R234 Changes in skin texture: Secondary | ICD-10-CM | POA: Diagnosis not present

## 2018-12-23 DIAGNOSIS — E440 Moderate protein-calorie malnutrition: Secondary | ICD-10-CM | POA: Diagnosis not present

## 2018-12-23 MED ORDER — COLLAGENASE 250 UNIT/GM EX OINT: 1.0000 "application " | TOPICAL_OINTMENT | Freq: Every day | CUTANEOUS | 3 refills | Status: AC

## 2018-12-23 NOTE — Progress Notes (Signed)
Office Visit Note   Patient: [name] [name]           Date of Birth: [birth year]           MRN: [id number] Visit Date: 12/23/2018              Requested by: Prince Solian, MD [address] [place], [place] [address] PCP: Prince Solian, MD  Chief Complaint  Patient presents with  . Right Foot - Pain      HPI: Patient is a 78 year old gentleman who was seen for initial evaluation for black eschar decubitus ulcer right heel.  Patient states that is painful he is essentially nonambulatory but does rest his heel on the ground.  Patient is a type II diabetic states that his sugars run between 140 and 160.  Patient is currently on 3 L nasal cannula FiO2.  Patient has just completed a course of doxycycline and is currently using Betadine to paint the wound.  Patient was treated previously at [facility] for a "dropfoot on the left.  Patient's son states that he has mild dementia.  Assessment & Plan: Visit Diagnoses:  1. Eschar of heel     Plan: Discussed the importance of nonweightbearing on the right heel we will place him in a PRAFO and he will wear this around the clock taking this off to wash the foot with soap and water daily apply Santyl ointment to the gauze covered with an Ace wrap and then wear the PRAFO at all times.  Will reevaluate and possibly need to start some debridement of the eschar after Santyl ointment dressing changes.  Follow-Up Instructions: Return in about 1 week (around 12/30/2018).   Ortho Exam  Patient is alert, oriented, no adenopathy, well-dressed, normal affect, normal respiratory effort. Examination patient ambulates in a wheelchair patient's son states he is essentially nonambulatory.  He has a strong dorsalis pedis pulse.  He has a cavovarus foot with essentially a foot drop does not have good strength with dorsiflexion and cannot get his foot up to neutral.  There is a large black eschar over the heel which is 35 mm in diameter and appears to be about a  millimeter thick.  There is no purulent drainage no ascending cellulitis no signs of infection.  Patient's hemoglobin A1c has not been updated.  His last albumin showed moderate protein caloric malnutrition.  Imaging: No results found. No images are attached to the encounter.  Labs: Lab Results  Component Value Date   HGBA1C 6.1 (H) 07/04/2013   ESRSEDRATE 15 07/04/2013   REPTSTATUS 01/03/2018 FINAL 12/29/2017   CULT  12/29/2017    NO GROWTH 5 DAYS Performed at [facility], [place] [address] [place], [place] [address]      Lab Results  Component Value Date   ALBUMIN 3.1 (L) 12/29/2017   ALBUMIN 2.6 (L) 12/17/2017   ALBUMIN 3.2 (L) 09/04/2014    Body mass index is 27.89 kg/m.  Orders:  No orders of the defined types were placed in this encounter.  No orders of the defined types were placed in this encounter.    Procedures: No procedures performed  Clinical Data: No additional findings.  ROS:  All other systems negative, except as noted in the HPI. Review of Systems  Objective: Vital Signs: Ht 5\' 11"  (1.803 m)   Wt 200 lb (90.7 kg)   BMI 27.89 kg/m   Specialty Comments:  No specialty comments available.  PMFS History: Patient Active Problem List  Diagnosis Date Noted  . Pressure injury of skin 12/31/2017  . Cellulitis 12/29/2017  . Hypoglycemia 12/17/2017  . Leg weakness 11/21/2015  . Lumbar canal stenosis 11/21/2015  . Open wound of heel 02/15/2015  . History of surgical procedure 11/16/2014  . Chronic obstructive pulmonary disease ([place]) 10/31/2014  . Type 2 diabetes mellitus ([place]) 10/31/2014  . BP (high blood pressure) 10/31/2014  . Acquired cavovarus deformity of foot 04/19/2014  . Acquired equinus deformity of foot 04/19/2014  . Ankle contracture 03/28/2014  . Rhabdomyolysis 07/05/2013  . COPD exacerbation ([place]) 07/05/2013  . H/O prostate cancer 07/04/2013  . Diabetes mellitus ([place]) 07/04/2013  . HTN (hypertension) 07/04/2013  .  History of tobacco abuse 07/04/2013  . Fall 07/04/2013  . Thigh pain, musculoskeletal 07/04/2013  . Hypoxia 07/04/2013  . HLD (hyperlipidemia) 07/04/2013  . Depression 07/04/2013  . Chronic kidney disease 07/04/2013   Past Medical History:  Diagnosis Date  . Anemia   . Arthritis   . Cancer ([place])   . COPD (chronic obstructive pulmonary disease) ([place])   . Diabetes mellitus   . Hyperlipidemia   . Hypertension   . Prostate cancer [facility])     History reviewed. No pertinent family history.  Past Surgical History:  Procedure Laterality Date  . CATARACT EXTRACTION W/ INTRAOCULAR LENS IMPLANT  2007   bilateral  . FOOT SURGERY     left drop foot repair  . SHOULDER OPEN ROTATOR CUFF REPAIR  2010   left   Social History   Occupational History  . Not on file  Tobacco Use  . Smoking status: Former Smoker    Last attempt to quit: 04/11/2005    Years since quitting: 13.7  . Smokeless tobacco: Never Used  Substance and Sexual Activity  . Alcohol use: No    Comment: quit 8 weeks ago  . Drug use: No  . Sexual activity: Yes

## 2019-01-01 DIAGNOSIS — J449 Chronic obstructive pulmonary disease, unspecified: Secondary | ICD-10-CM | POA: Diagnosis not present

## 2019-01-05 ENCOUNTER — Telehealth (INDEPENDENT_AMBULATORY_CARE_PROVIDER_SITE_OTHER): Payer: Self-pay | Admitting: Radiology

## 2019-01-05 NOTE — Telephone Encounter (Signed)
Tried calling patient to ask pre screening questions for appointment on 4/6. Unable to reach patient and mailbox full so unable to leave voicemail.

## 2019-01-09 ENCOUNTER — Ambulatory Visit (INDEPENDENT_AMBULATORY_CARE_PROVIDER_SITE_OTHER): Payer: Medicare HMO | Admitting: Orthopedic Surgery

## 2019-01-09 DIAGNOSIS — J449 Chronic obstructive pulmonary disease, unspecified: Secondary | ICD-10-CM | POA: Diagnosis not present

## 2019-01-11 ENCOUNTER — Ambulatory Visit: Payer: Medicare HMO | Admitting: Sports Medicine

## 2019-01-22 DIAGNOSIS — C61 Malignant neoplasm of prostate: Secondary | ICD-10-CM | POA: Diagnosis not present

## 2019-01-22 DIAGNOSIS — J449 Chronic obstructive pulmonary disease, unspecified: Secondary | ICD-10-CM | POA: Diagnosis not present

## 2019-01-22 DIAGNOSIS — M21372 Foot drop, left foot: Secondary | ICD-10-CM | POA: Diagnosis not present

## 2019-01-22 DIAGNOSIS — I10 Essential (primary) hypertension: Secondary | ICD-10-CM | POA: Diagnosis not present

## 2019-02-01 DIAGNOSIS — J449 Chronic obstructive pulmonary disease, unspecified: Secondary | ICD-10-CM | POA: Diagnosis not present

## 2019-02-08 DIAGNOSIS — J449 Chronic obstructive pulmonary disease, unspecified: Secondary | ICD-10-CM | POA: Diagnosis not present

## 2019-02-21 DIAGNOSIS — R21 Rash and other nonspecific skin eruption: Secondary | ICD-10-CM | POA: Diagnosis not present

## 2019-02-21 DIAGNOSIS — N183 Chronic kidney disease, stage 3 (moderate): Secondary | ICD-10-CM | POA: Diagnosis not present

## 2019-02-21 DIAGNOSIS — E1122 Type 2 diabetes mellitus with diabetic chronic kidney disease: Secondary | ICD-10-CM | POA: Diagnosis not present

## 2019-02-21 DIAGNOSIS — J449 Chronic obstructive pulmonary disease, unspecified: Secondary | ICD-10-CM | POA: Diagnosis not present

## 2019-02-21 DIAGNOSIS — C61 Malignant neoplasm of prostate: Secondary | ICD-10-CM | POA: Diagnosis not present

## 2019-02-21 DIAGNOSIS — I10 Essential (primary) hypertension: Secondary | ICD-10-CM | POA: Diagnosis not present

## 2019-02-21 DIAGNOSIS — M21372 Foot drop, left foot: Secondary | ICD-10-CM | POA: Diagnosis not present

## 2019-03-11 DIAGNOSIS — J449 Chronic obstructive pulmonary disease, unspecified: Secondary | ICD-10-CM | POA: Diagnosis not present

## 2019-03-24 DIAGNOSIS — I10 Essential (primary) hypertension: Secondary | ICD-10-CM | POA: Diagnosis not present

## 2019-03-24 DIAGNOSIS — M21372 Foot drop, left foot: Secondary | ICD-10-CM | POA: Diagnosis not present

## 2019-03-24 DIAGNOSIS — C61 Malignant neoplasm of prostate: Secondary | ICD-10-CM | POA: Diagnosis not present

## 2019-03-24 DIAGNOSIS — J449 Chronic obstructive pulmonary disease, unspecified: Secondary | ICD-10-CM | POA: Diagnosis not present

## 2019-03-27 IMAGING — CR DG CHEST 2V
2 series · 2 of 2 positions shown · non-contrast
Comparison: Chest radiograph performed 11/15/2017

CLINICAL DATA: Acute onset of cough.

EXAM:
CHEST - 2 VIEW

[chest lat]
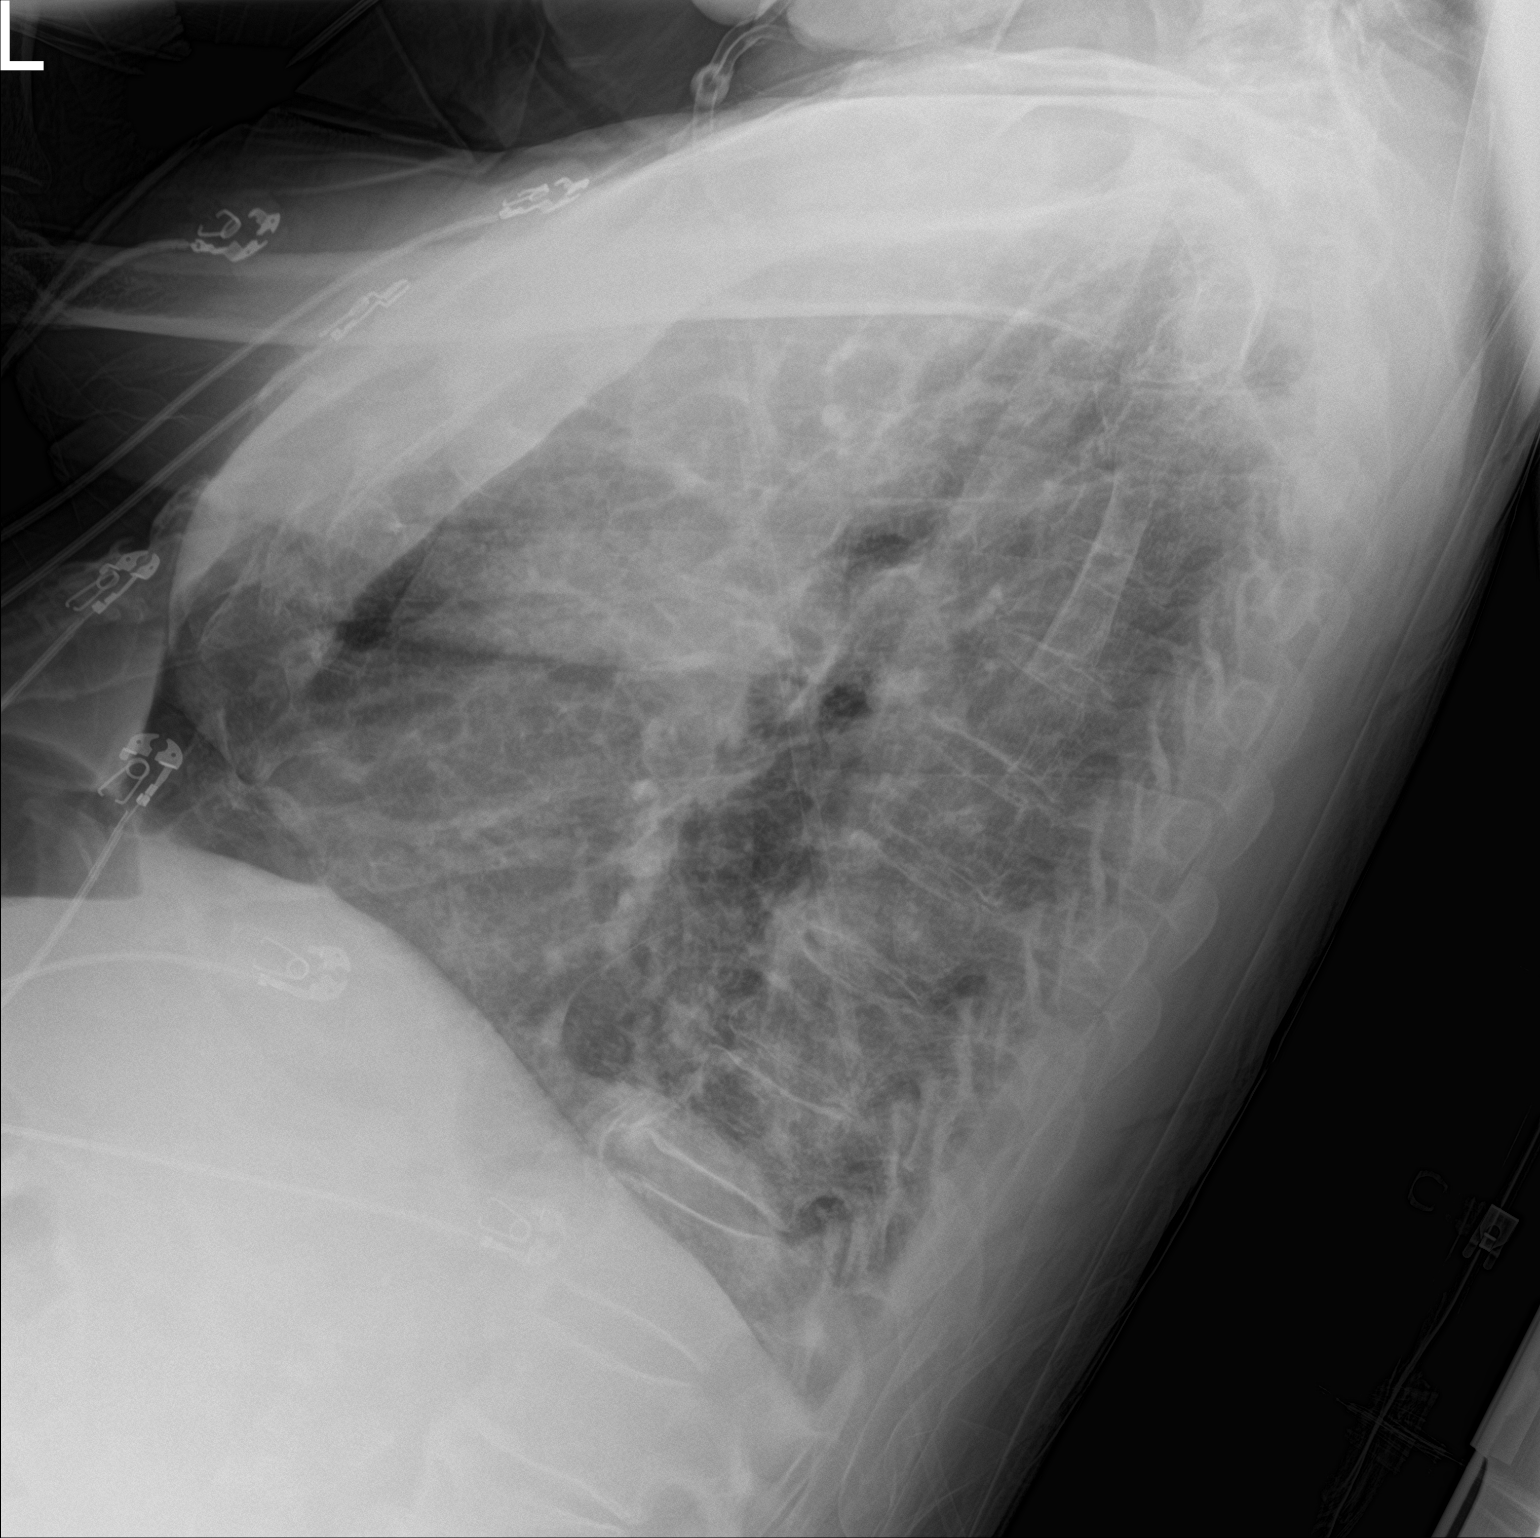

[chest ap]
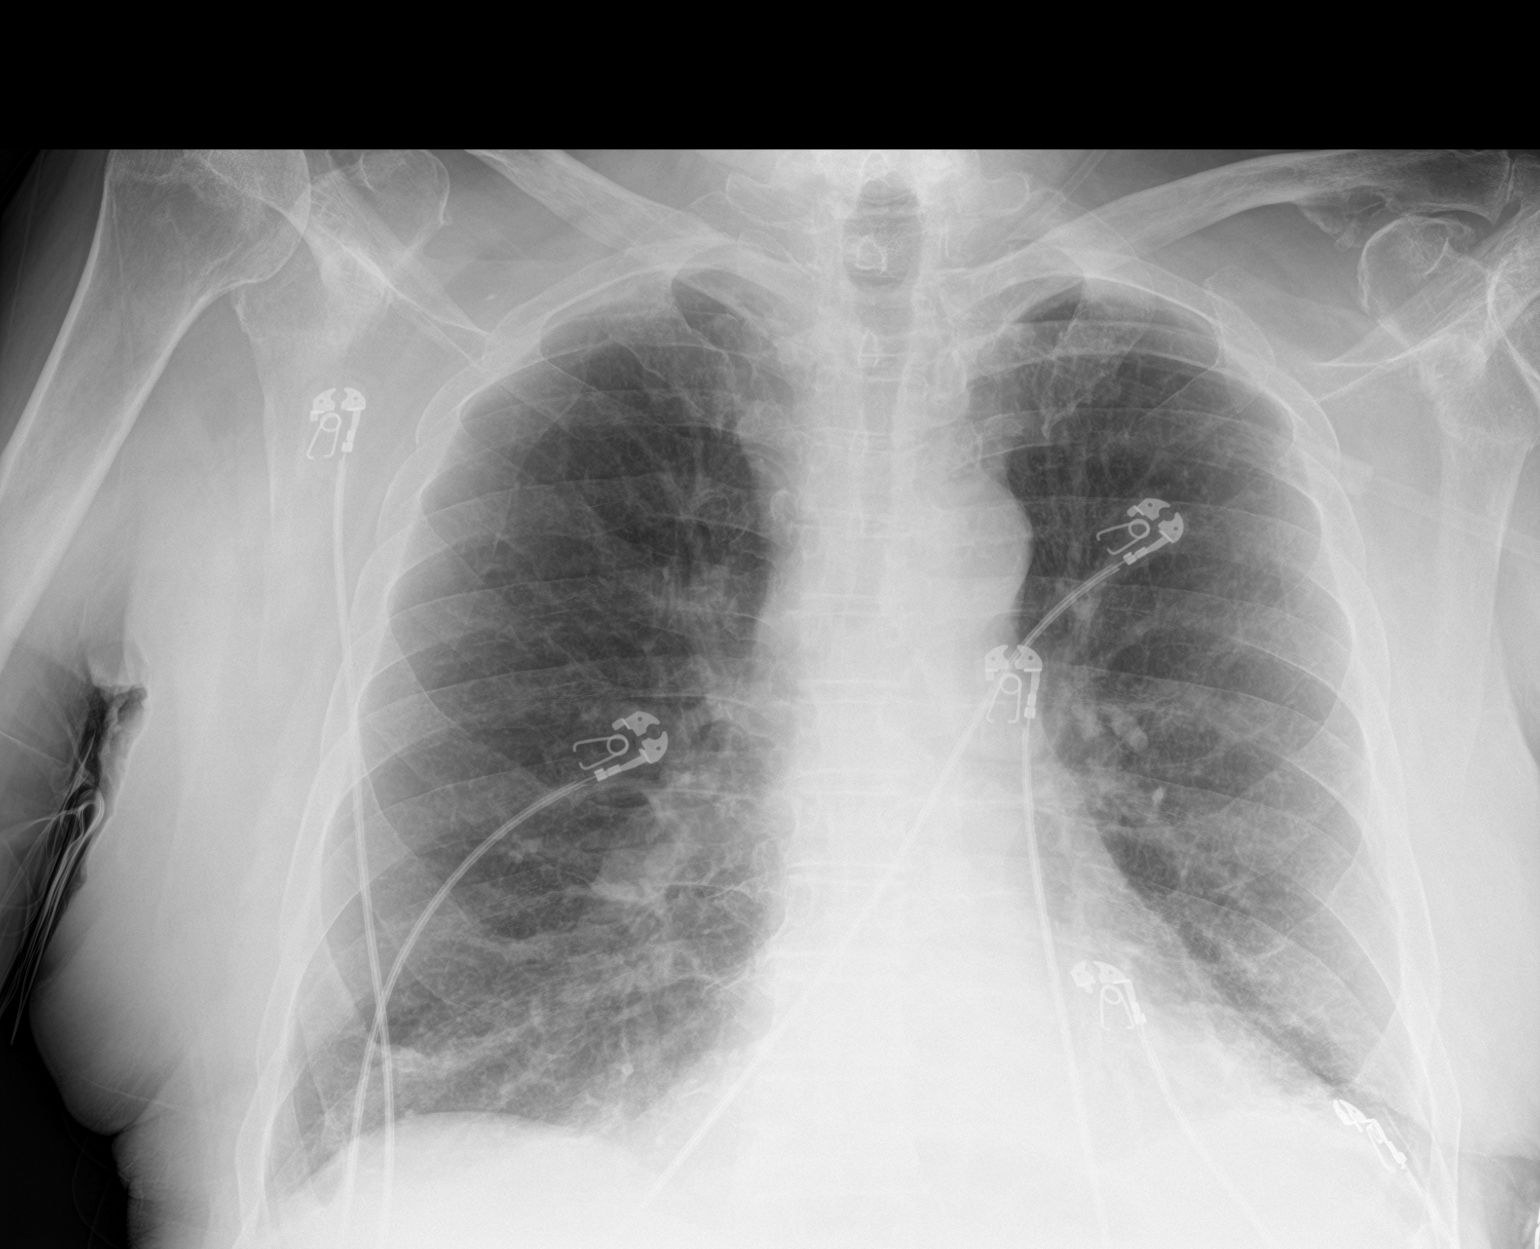

[2 of 2 positions shown; findings below may reference images not displayed]

FINDINGS: The lungs are well-aerated. Mild bibasilar opacities may reflect
atelectasis or possibly mild pneumonia. There is no evidence of
pleural effusion or pneumothorax.

The heart is borderline enlarged. No acute osseous abnormalities are
seen.
IMPRESSION: Mild bibasilar opacities may reflect atelectasis or possibly mild
pneumonia. Borderline cardiomegaly.

## 2019-03-27 IMAGING — CT CT CHEST W/O CM
2 of 4 series · 15 of 36 positions shown, 18 images · non-contrast
Comparison: Chest x-ray from earlier in the same day

CLINICAL DATA: Chest pain and shortness of Breath

EXAM:
CT CHEST WITHOUT CONTRAST
TECHNIQUE: Multidetector CT imaging of the chest was performed following the
standard protocol without IV contrast.

[Series 3: chest wo · axial · 0.80mm/px · z∈[+1171,+1435]mm · 12 of 157 slices shown, 15 images]
[im 13/157  mediastinal]
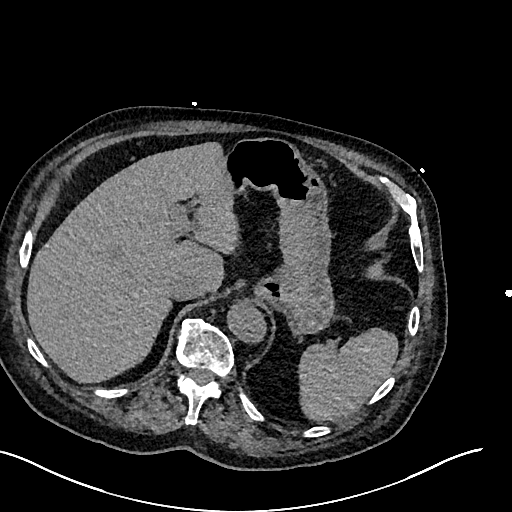
[im 13/157  lung]
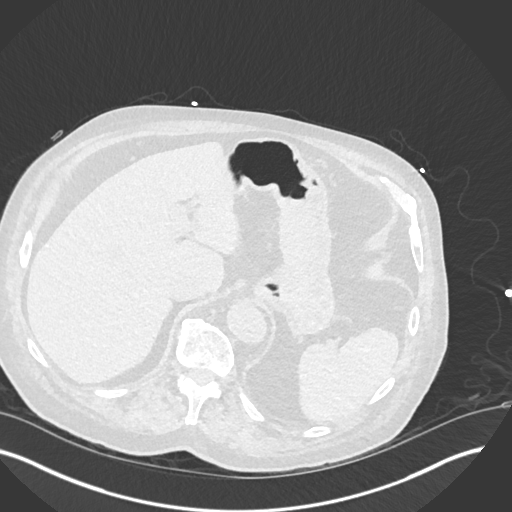
[im 25/157  lung]
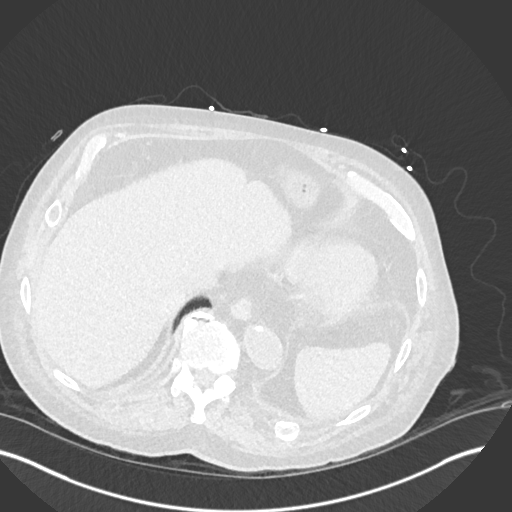
[im 37/157  lung]
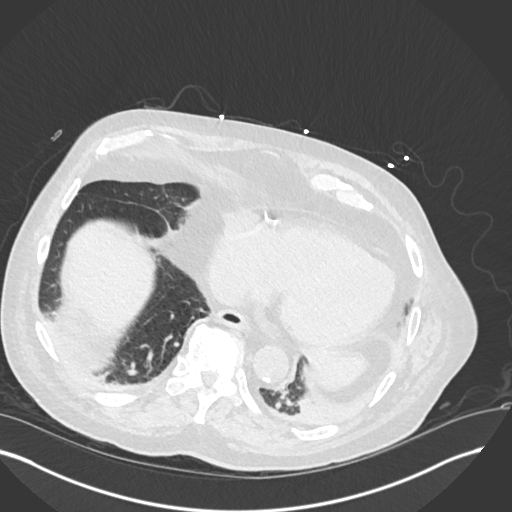
[im 49/157  lung]
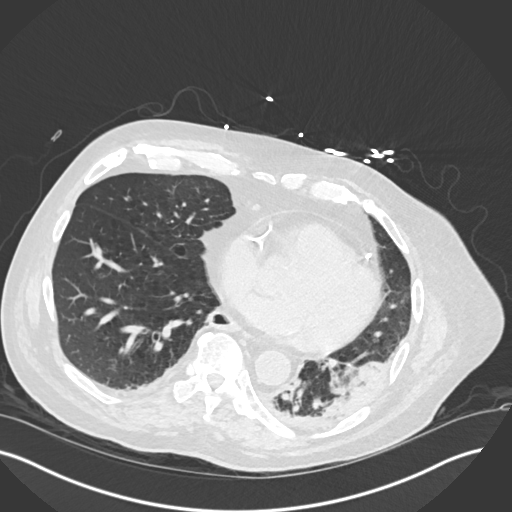
[im 61/157  mediastinal]
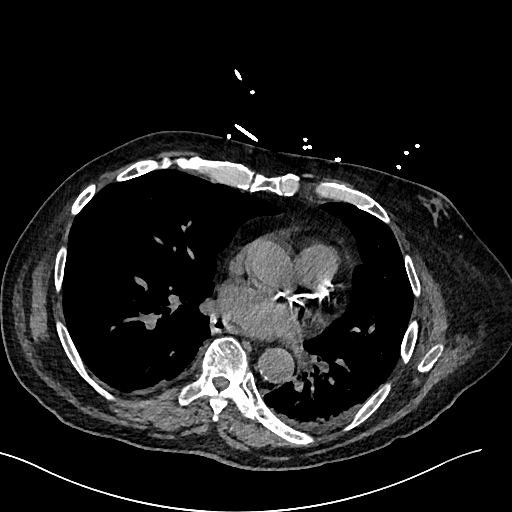
[im 61/157  lung]
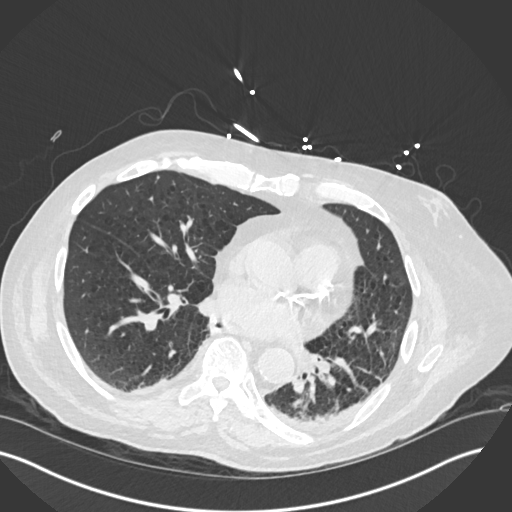
[im 73/157  lung]
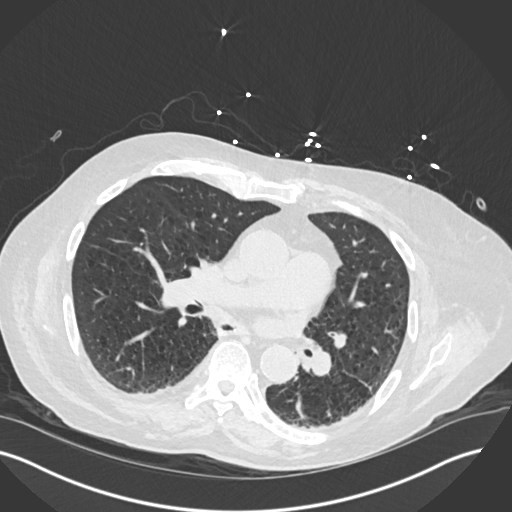
[im 85/157  lung]
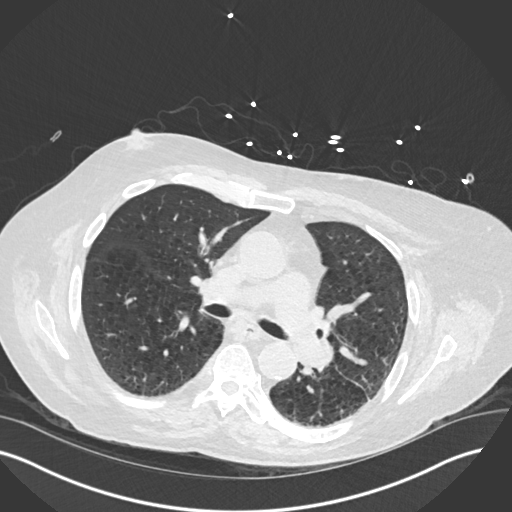
[im 97/157  lung]
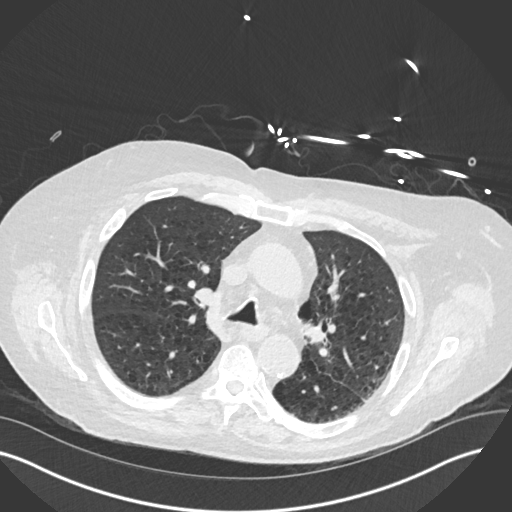
[im 109/157  mediastinal]
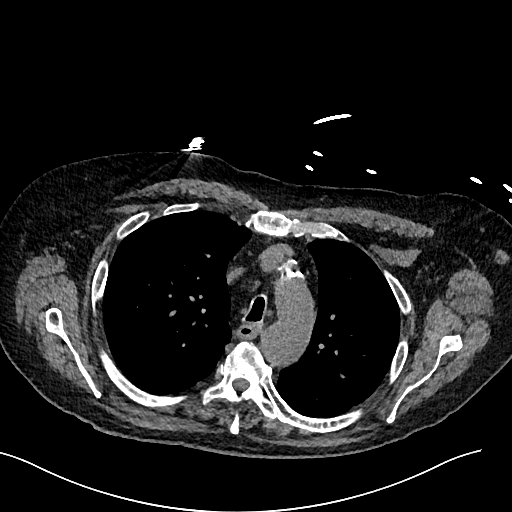
[im 109/157  lung]
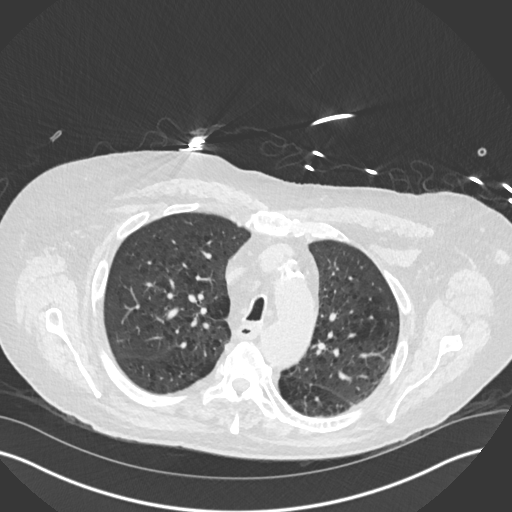
[im 121/157  lung]
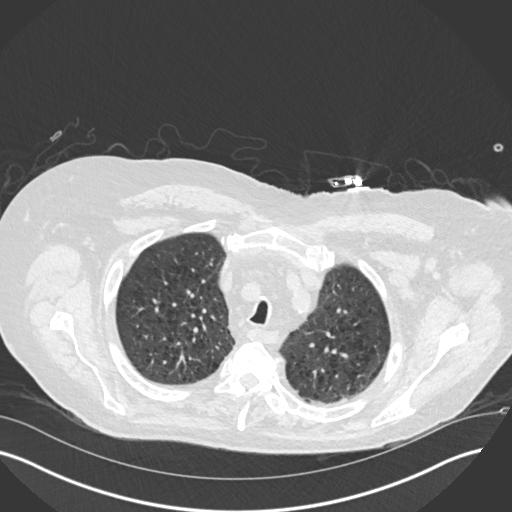
[im 133/157  lung]
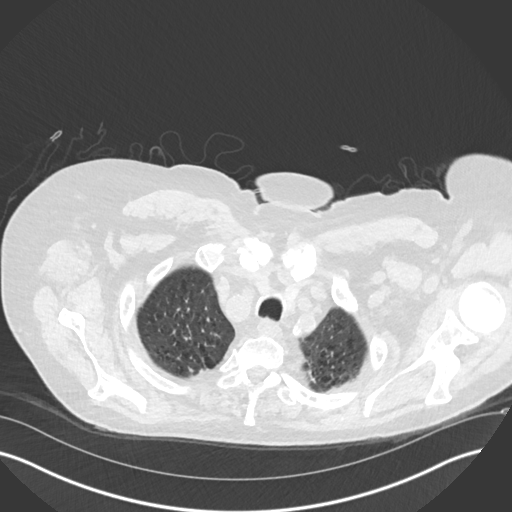
[im 145/157  lung]
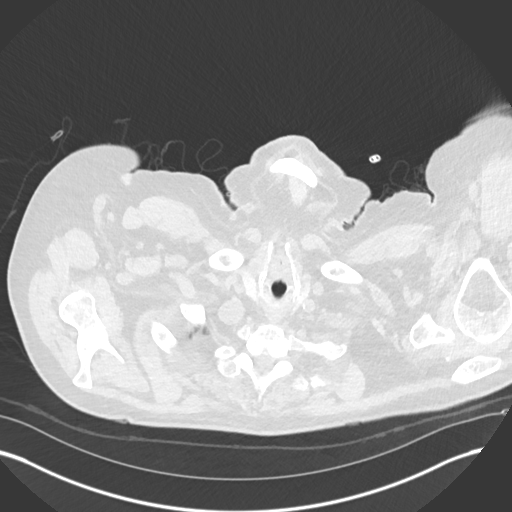

[Series 6: cor · coronal · 0.63mm/px · 3 of 151 slices shown]
[im 31/151  lung]
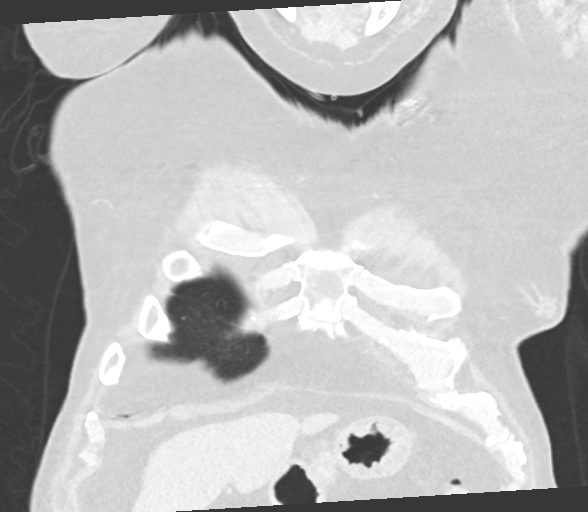
[im 61/151  lung]
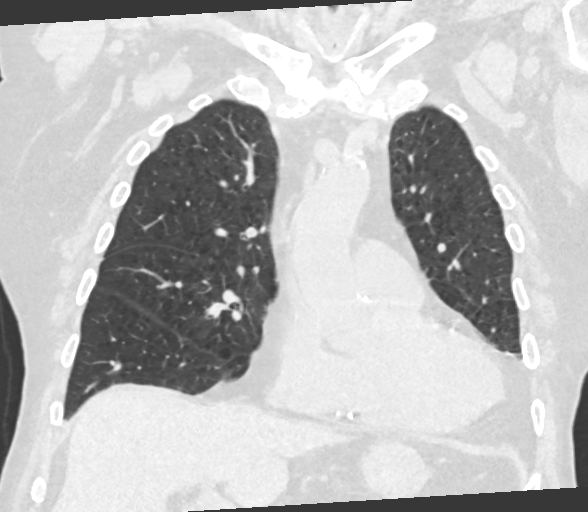
[im 91/151  lung]
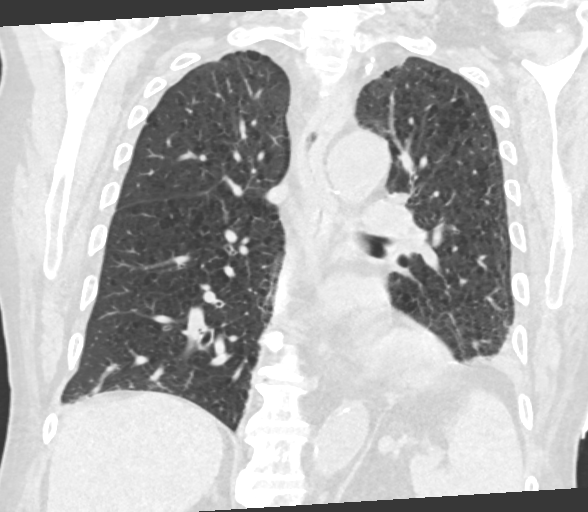

[15 of 36 positions shown; findings below may reference images not displayed]

FINDINGS: Cardiovascular: Thoracic aorta demonstrates atherosclerotic
calcifications without significant aneurysmal dilatation. Coronary
calcifications are seen. No cardiac enlargement is noted.

Mediastinum/Nodes: Thoracic inlet is within normal limits. No hilar
or mediastinal adenopathy is seen. The esophagus is within normal
limits.

Lungs/Pleura: Left lower lobe infiltrate is noted. No associated
effusion is seen. No significant parenchymal nodules are noted.

Upper Abdomen: Visualized upper abdomen is within normal limits.

Musculoskeletal: Biapical pleural and parenchymal scarring is seen.
Emphysematous changes are noted.
IMPRESSION: Left lower lobe infiltrate.

Aortic Atherosclerosis (T288X-542.2) and Emphysema (T288X-CRE.T).

## 2019-04-10 DIAGNOSIS — J449 Chronic obstructive pulmonary disease, unspecified: Secondary | ICD-10-CM | POA: Diagnosis not present

## 2019-04-20 DIAGNOSIS — E11628 Type 2 diabetes mellitus with other skin complications: Secondary | ICD-10-CM | POA: Diagnosis not present

## 2019-04-20 DIAGNOSIS — F39 Unspecified mood [affective] disorder: Secondary | ICD-10-CM | POA: Diagnosis not present

## 2019-04-20 DIAGNOSIS — I129 Hypertensive chronic kidney disease with stage 1 through stage 4 chronic kidney disease, or unspecified chronic kidney disease: Secondary | ICD-10-CM | POA: Diagnosis not present

## 2019-04-20 DIAGNOSIS — E1122 Type 2 diabetes mellitus with diabetic chronic kidney disease: Secondary | ICD-10-CM | POA: Diagnosis not present

## 2019-04-20 DIAGNOSIS — R21 Rash and other nonspecific skin eruption: Secondary | ICD-10-CM | POA: Diagnosis not present

## 2019-04-20 DIAGNOSIS — N183 Chronic kidney disease, stage 3 (moderate): Secondary | ICD-10-CM | POA: Diagnosis not present

## 2019-04-20 DIAGNOSIS — F0390 Unspecified dementia without behavioral disturbance: Secondary | ICD-10-CM | POA: Diagnosis not present

## 2019-04-20 DIAGNOSIS — E669 Obesity, unspecified: Secondary | ICD-10-CM | POA: Diagnosis not present

## 2019-04-23 DIAGNOSIS — I10 Essential (primary) hypertension: Secondary | ICD-10-CM | POA: Diagnosis not present

## 2019-04-23 DIAGNOSIS — M21372 Foot drop, left foot: Secondary | ICD-10-CM | POA: Diagnosis not present

## 2019-04-23 DIAGNOSIS — J449 Chronic obstructive pulmonary disease, unspecified: Secondary | ICD-10-CM | POA: Diagnosis not present

## 2019-04-23 DIAGNOSIS — C61 Malignant neoplasm of prostate: Secondary | ICD-10-CM | POA: Diagnosis not present

## 2019-05-09 DIAGNOSIS — D509 Iron deficiency anemia, unspecified: Secondary | ICD-10-CM | POA: Diagnosis not present

## 2019-05-09 DIAGNOSIS — I129 Hypertensive chronic kidney disease with stage 1 through stage 4 chronic kidney disease, or unspecified chronic kidney disease: Secondary | ICD-10-CM | POA: Diagnosis not present

## 2019-05-09 DIAGNOSIS — J449 Chronic obstructive pulmonary disease, unspecified: Secondary | ICD-10-CM | POA: Diagnosis not present

## 2019-05-09 DIAGNOSIS — E1151 Type 2 diabetes mellitus with diabetic peripheral angiopathy without gangrene: Secondary | ICD-10-CM | POA: Diagnosis not present

## 2019-05-09 DIAGNOSIS — E1122 Type 2 diabetes mellitus with diabetic chronic kidney disease: Secondary | ICD-10-CM | POA: Diagnosis not present

## 2019-05-09 DIAGNOSIS — L89610 Pressure ulcer of right heel, unstageable: Secondary | ICD-10-CM | POA: Diagnosis not present

## 2019-05-09 DIAGNOSIS — N183 Chronic kidney disease, stage 3 (moderate): Secondary | ICD-10-CM | POA: Diagnosis not present

## 2019-05-09 DIAGNOSIS — L89322 Pressure ulcer of left buttock, stage 2: Secondary | ICD-10-CM | POA: Diagnosis not present

## 2019-05-09 DIAGNOSIS — N400 Enlarged prostate without lower urinary tract symptoms: Secondary | ICD-10-CM | POA: Diagnosis not present

## 2019-05-11 DIAGNOSIS — J449 Chronic obstructive pulmonary disease, unspecified: Secondary | ICD-10-CM | POA: Diagnosis not present

## 2019-05-24 DIAGNOSIS — C61 Malignant neoplasm of prostate: Secondary | ICD-10-CM | POA: Diagnosis not present

## 2019-05-24 DIAGNOSIS — J449 Chronic obstructive pulmonary disease, unspecified: Secondary | ICD-10-CM | POA: Diagnosis not present

## 2019-05-24 DIAGNOSIS — M21372 Foot drop, left foot: Secondary | ICD-10-CM | POA: Diagnosis not present

## 2019-05-24 DIAGNOSIS — I10 Essential (primary) hypertension: Secondary | ICD-10-CM | POA: Diagnosis not present

## 2019-06-11 DIAGNOSIS — J449 Chronic obstructive pulmonary disease, unspecified: Secondary | ICD-10-CM | POA: Diagnosis not present

## 2019-06-24 DIAGNOSIS — C61 Malignant neoplasm of prostate: Secondary | ICD-10-CM | POA: Diagnosis not present

## 2019-06-24 DIAGNOSIS — I10 Essential (primary) hypertension: Secondary | ICD-10-CM | POA: Diagnosis not present

## 2019-06-24 DIAGNOSIS — M21372 Foot drop, left foot: Secondary | ICD-10-CM | POA: Diagnosis not present

## 2019-06-24 DIAGNOSIS — J449 Chronic obstructive pulmonary disease, unspecified: Secondary | ICD-10-CM | POA: Diagnosis not present

## 2019-07-11 DIAGNOSIS — J449 Chronic obstructive pulmonary disease, unspecified: Secondary | ICD-10-CM | POA: Diagnosis not present

## 2019-07-26 DIAGNOSIS — Z741 Need for assistance with personal care: Secondary | ICD-10-CM | POA: Diagnosis not present

## 2019-07-26 DIAGNOSIS — L98419 Non-pressure chronic ulcer of buttock with unspecified severity: Secondary | ICD-10-CM | POA: Diagnosis not present

## 2019-07-26 DIAGNOSIS — N3946 Mixed incontinence: Secondary | ICD-10-CM | POA: Diagnosis not present

## 2019-08-11 DIAGNOSIS — J449 Chronic obstructive pulmonary disease, unspecified: Secondary | ICD-10-CM | POA: Diagnosis not present

## 2019-08-29 DIAGNOSIS — E1122 Type 2 diabetes mellitus with diabetic chronic kidney disease: Secondary | ICD-10-CM | POA: Diagnosis not present

## 2019-08-29 DIAGNOSIS — I129 Hypertensive chronic kidney disease with stage 1 through stage 4 chronic kidney disease, or unspecified chronic kidney disease: Secondary | ICD-10-CM | POA: Diagnosis not present

## 2019-08-29 DIAGNOSIS — N1831 Chronic kidney disease, stage 3a: Secondary | ICD-10-CM | POA: Diagnosis not present

## 2019-08-29 DIAGNOSIS — F0390 Unspecified dementia without behavioral disturbance: Secondary | ICD-10-CM | POA: Diagnosis not present

## 2019-08-29 DIAGNOSIS — N183 Chronic kidney disease, stage 3 unspecified: Secondary | ICD-10-CM | POA: Diagnosis not present

## 2019-08-29 DIAGNOSIS — Z9981 Dependence on supplemental oxygen: Secondary | ICD-10-CM | POA: Diagnosis not present

## 2019-08-29 DIAGNOSIS — L98419 Non-pressure chronic ulcer of buttock with unspecified severity: Secondary | ICD-10-CM | POA: Diagnosis not present

## 2019-08-29 DIAGNOSIS — N3946 Mixed incontinence: Secondary | ICD-10-CM | POA: Diagnosis not present

## 2019-08-29 DIAGNOSIS — E11622 Type 2 diabetes mellitus with other skin ulcer: Secondary | ICD-10-CM | POA: Diagnosis not present

## 2019-08-29 DIAGNOSIS — Z741 Need for assistance with personal care: Secondary | ICD-10-CM | POA: Diagnosis not present

## 2019-08-29 DIAGNOSIS — J449 Chronic obstructive pulmonary disease, unspecified: Secondary | ICD-10-CM | POA: Diagnosis not present

## 2019-08-29 DIAGNOSIS — C61 Malignant neoplasm of prostate: Secondary | ICD-10-CM | POA: Diagnosis not present

## 2019-09-10 DIAGNOSIS — J449 Chronic obstructive pulmonary disease, unspecified: Secondary | ICD-10-CM | POA: Diagnosis not present

## 2019-10-11 DIAGNOSIS — J449 Chronic obstructive pulmonary disease, unspecified: Secondary | ICD-10-CM | POA: Diagnosis not present

## 2019-11-27 DIAGNOSIS — E782 Mixed hyperlipidemia: Secondary | ICD-10-CM | POA: Diagnosis not present

## 2019-11-27 DIAGNOSIS — F17200 Nicotine dependence, unspecified, uncomplicated: Secondary | ICD-10-CM | POA: Diagnosis not present

## 2019-11-27 DIAGNOSIS — I129 Hypertensive chronic kidney disease with stage 1 through stage 4 chronic kidney disease, or unspecified chronic kidney disease: Secondary | ICD-10-CM | POA: Diagnosis not present

## 2019-11-27 DIAGNOSIS — Z0000 Encounter for general adult medical examination without abnormal findings: Secondary | ICD-10-CM | POA: Diagnosis not present

## 2019-11-27 DIAGNOSIS — J449 Chronic obstructive pulmonary disease, unspecified: Secondary | ICD-10-CM | POA: Diagnosis not present

## 2019-11-27 DIAGNOSIS — E669 Obesity, unspecified: Secondary | ICD-10-CM | POA: Diagnosis not present

## 2019-11-27 DIAGNOSIS — N1831 Chronic kidney disease, stage 3a: Secondary | ICD-10-CM | POA: Diagnosis not present

## 2019-11-27 DIAGNOSIS — C61 Malignant neoplasm of prostate: Secondary | ICD-10-CM | POA: Diagnosis not present

## 2019-11-27 DIAGNOSIS — E1122 Type 2 diabetes mellitus with diabetic chronic kidney disease: Secondary | ICD-10-CM | POA: Diagnosis not present

## 2019-12-08 DIAGNOSIS — M48061 Spinal stenosis, lumbar region without neurogenic claudication: Secondary | ICD-10-CM | POA: Diagnosis not present

## 2019-12-08 DIAGNOSIS — E1122 Type 2 diabetes mellitus with diabetic chronic kidney disease: Secondary | ICD-10-CM | POA: Diagnosis not present

## 2019-12-08 DIAGNOSIS — Z466 Encounter for fitting and adjustment of urinary device: Secondary | ICD-10-CM | POA: Diagnosis not present

## 2019-12-08 DIAGNOSIS — D509 Iron deficiency anemia, unspecified: Secondary | ICD-10-CM | POA: Diagnosis not present

## 2019-12-08 DIAGNOSIS — L89312 Pressure ulcer of right buttock, stage 2: Secondary | ICD-10-CM | POA: Diagnosis not present

## 2019-12-08 DIAGNOSIS — L89322 Pressure ulcer of left buttock, stage 2: Secondary | ICD-10-CM | POA: Diagnosis not present

## 2019-12-08 DIAGNOSIS — N3946 Mixed incontinence: Secondary | ICD-10-CM | POA: Diagnosis not present

## 2019-12-08 DIAGNOSIS — I129 Hypertensive chronic kidney disease with stage 1 through stage 4 chronic kidney disease, or unspecified chronic kidney disease: Secondary | ICD-10-CM | POA: Diagnosis not present

## 2019-12-08 DIAGNOSIS — N1831 Chronic kidney disease, stage 3a: Secondary | ICD-10-CM | POA: Diagnosis not present

## 2019-12-13 DIAGNOSIS — L89322 Pressure ulcer of left buttock, stage 2: Secondary | ICD-10-CM | POA: Diagnosis not present

## 2019-12-13 DIAGNOSIS — N1831 Chronic kidney disease, stage 3a: Secondary | ICD-10-CM | POA: Diagnosis not present

## 2019-12-13 DIAGNOSIS — N3946 Mixed incontinence: Secondary | ICD-10-CM | POA: Diagnosis not present

## 2019-12-13 DIAGNOSIS — D509 Iron deficiency anemia, unspecified: Secondary | ICD-10-CM | POA: Diagnosis not present

## 2019-12-13 DIAGNOSIS — E1122 Type 2 diabetes mellitus with diabetic chronic kidney disease: Secondary | ICD-10-CM | POA: Diagnosis not present

## 2019-12-13 DIAGNOSIS — L89312 Pressure ulcer of right buttock, stage 2: Secondary | ICD-10-CM | POA: Diagnosis not present

## 2019-12-13 DIAGNOSIS — M48061 Spinal stenosis, lumbar region without neurogenic claudication: Secondary | ICD-10-CM | POA: Diagnosis not present

## 2019-12-13 DIAGNOSIS — I129 Hypertensive chronic kidney disease with stage 1 through stage 4 chronic kidney disease, or unspecified chronic kidney disease: Secondary | ICD-10-CM | POA: Diagnosis not present

## 2019-12-13 DIAGNOSIS — Z466 Encounter for fitting and adjustment of urinary device: Secondary | ICD-10-CM | POA: Diagnosis not present

## 2019-12-15 DIAGNOSIS — N1831 Chronic kidney disease, stage 3a: Secondary | ICD-10-CM | POA: Diagnosis not present

## 2019-12-15 DIAGNOSIS — N3946 Mixed incontinence: Secondary | ICD-10-CM | POA: Diagnosis not present

## 2019-12-15 DIAGNOSIS — L89322 Pressure ulcer of left buttock, stage 2: Secondary | ICD-10-CM | POA: Diagnosis not present

## 2019-12-15 DIAGNOSIS — L89312 Pressure ulcer of right buttock, stage 2: Secondary | ICD-10-CM | POA: Diagnosis not present

## 2019-12-15 DIAGNOSIS — Z466 Encounter for fitting and adjustment of urinary device: Secondary | ICD-10-CM | POA: Diagnosis not present

## 2019-12-15 DIAGNOSIS — D509 Iron deficiency anemia, unspecified: Secondary | ICD-10-CM | POA: Diagnosis not present

## 2019-12-15 DIAGNOSIS — I129 Hypertensive chronic kidney disease with stage 1 through stage 4 chronic kidney disease, or unspecified chronic kidney disease: Secondary | ICD-10-CM | POA: Diagnosis not present

## 2019-12-15 DIAGNOSIS — M48061 Spinal stenosis, lumbar region without neurogenic claudication: Secondary | ICD-10-CM | POA: Diagnosis not present

## 2019-12-15 DIAGNOSIS — E1122 Type 2 diabetes mellitus with diabetic chronic kidney disease: Secondary | ICD-10-CM | POA: Diagnosis not present

## 2019-12-20 DIAGNOSIS — N1831 Chronic kidney disease, stage 3a: Secondary | ICD-10-CM | POA: Diagnosis not present

## 2019-12-20 DIAGNOSIS — Z466 Encounter for fitting and adjustment of urinary device: Secondary | ICD-10-CM | POA: Diagnosis not present

## 2019-12-20 DIAGNOSIS — N3946 Mixed incontinence: Secondary | ICD-10-CM | POA: Diagnosis not present

## 2019-12-20 DIAGNOSIS — I129 Hypertensive chronic kidney disease with stage 1 through stage 4 chronic kidney disease, or unspecified chronic kidney disease: Secondary | ICD-10-CM | POA: Diagnosis not present

## 2019-12-20 DIAGNOSIS — E1122 Type 2 diabetes mellitus with diabetic chronic kidney disease: Secondary | ICD-10-CM | POA: Diagnosis not present

## 2019-12-20 DIAGNOSIS — M48061 Spinal stenosis, lumbar region without neurogenic claudication: Secondary | ICD-10-CM | POA: Diagnosis not present

## 2019-12-20 DIAGNOSIS — D509 Iron deficiency anemia, unspecified: Secondary | ICD-10-CM | POA: Diagnosis not present

## 2019-12-20 DIAGNOSIS — L89312 Pressure ulcer of right buttock, stage 2: Secondary | ICD-10-CM | POA: Diagnosis not present

## 2019-12-20 DIAGNOSIS — L89322 Pressure ulcer of left buttock, stage 2: Secondary | ICD-10-CM | POA: Diagnosis not present

## 2019-12-23 DIAGNOSIS — M48061 Spinal stenosis, lumbar region without neurogenic claudication: Secondary | ICD-10-CM | POA: Diagnosis not present

## 2019-12-23 DIAGNOSIS — L89312 Pressure ulcer of right buttock, stage 2: Secondary | ICD-10-CM | POA: Diagnosis not present

## 2019-12-23 DIAGNOSIS — N1831 Chronic kidney disease, stage 3a: Secondary | ICD-10-CM | POA: Diagnosis not present

## 2019-12-23 DIAGNOSIS — L89322 Pressure ulcer of left buttock, stage 2: Secondary | ICD-10-CM | POA: Diagnosis not present

## 2019-12-23 DIAGNOSIS — I129 Hypertensive chronic kidney disease with stage 1 through stage 4 chronic kidney disease, or unspecified chronic kidney disease: Secondary | ICD-10-CM | POA: Diagnosis not present

## 2019-12-23 DIAGNOSIS — E1122 Type 2 diabetes mellitus with diabetic chronic kidney disease: Secondary | ICD-10-CM | POA: Diagnosis not present

## 2019-12-23 DIAGNOSIS — D509 Iron deficiency anemia, unspecified: Secondary | ICD-10-CM | POA: Diagnosis not present

## 2019-12-23 DIAGNOSIS — Z466 Encounter for fitting and adjustment of urinary device: Secondary | ICD-10-CM | POA: Diagnosis not present

## 2019-12-23 DIAGNOSIS — N3946 Mixed incontinence: Secondary | ICD-10-CM | POA: Diagnosis not present

## 2019-12-26 DIAGNOSIS — N3946 Mixed incontinence: Secondary | ICD-10-CM | POA: Diagnosis not present

## 2019-12-26 DIAGNOSIS — Z466 Encounter for fitting and adjustment of urinary device: Secondary | ICD-10-CM | POA: Diagnosis not present

## 2019-12-26 DIAGNOSIS — I129 Hypertensive chronic kidney disease with stage 1 through stage 4 chronic kidney disease, or unspecified chronic kidney disease: Secondary | ICD-10-CM | POA: Diagnosis not present

## 2019-12-26 DIAGNOSIS — N1831 Chronic kidney disease, stage 3a: Secondary | ICD-10-CM | POA: Diagnosis not present

## 2019-12-26 DIAGNOSIS — E1122 Type 2 diabetes mellitus with diabetic chronic kidney disease: Secondary | ICD-10-CM | POA: Diagnosis not present

## 2019-12-26 DIAGNOSIS — M48061 Spinal stenosis, lumbar region without neurogenic claudication: Secondary | ICD-10-CM | POA: Diagnosis not present

## 2019-12-26 DIAGNOSIS — D509 Iron deficiency anemia, unspecified: Secondary | ICD-10-CM | POA: Diagnosis not present

## 2019-12-26 DIAGNOSIS — L89312 Pressure ulcer of right buttock, stage 2: Secondary | ICD-10-CM | POA: Diagnosis not present

## 2019-12-26 DIAGNOSIS — L89322 Pressure ulcer of left buttock, stage 2: Secondary | ICD-10-CM | POA: Diagnosis not present

## 2020-01-01 DIAGNOSIS — Z466 Encounter for fitting and adjustment of urinary device: Secondary | ICD-10-CM | POA: Diagnosis not present

## 2020-01-01 DIAGNOSIS — L89322 Pressure ulcer of left buttock, stage 2: Secondary | ICD-10-CM | POA: Diagnosis not present

## 2020-01-01 DIAGNOSIS — N3946 Mixed incontinence: Secondary | ICD-10-CM | POA: Diagnosis not present

## 2020-01-01 DIAGNOSIS — I129 Hypertensive chronic kidney disease with stage 1 through stage 4 chronic kidney disease, or unspecified chronic kidney disease: Secondary | ICD-10-CM | POA: Diagnosis not present

## 2020-01-01 DIAGNOSIS — M48061 Spinal stenosis, lumbar region without neurogenic claudication: Secondary | ICD-10-CM | POA: Diagnosis not present

## 2020-01-01 DIAGNOSIS — N1831 Chronic kidney disease, stage 3a: Secondary | ICD-10-CM | POA: Diagnosis not present

## 2020-01-01 DIAGNOSIS — E1122 Type 2 diabetes mellitus with diabetic chronic kidney disease: Secondary | ICD-10-CM | POA: Diagnosis not present

## 2020-01-01 DIAGNOSIS — D509 Iron deficiency anemia, unspecified: Secondary | ICD-10-CM | POA: Diagnosis not present

## 2020-01-01 DIAGNOSIS — L89312 Pressure ulcer of right buttock, stage 2: Secondary | ICD-10-CM | POA: Diagnosis not present

## 2020-01-10 DIAGNOSIS — N3946 Mixed incontinence: Secondary | ICD-10-CM | POA: Diagnosis not present

## 2020-01-10 DIAGNOSIS — D509 Iron deficiency anemia, unspecified: Secondary | ICD-10-CM | POA: Diagnosis not present

## 2020-01-10 DIAGNOSIS — E1122 Type 2 diabetes mellitus with diabetic chronic kidney disease: Secondary | ICD-10-CM | POA: Diagnosis not present

## 2020-01-10 DIAGNOSIS — L89312 Pressure ulcer of right buttock, stage 2: Secondary | ICD-10-CM | POA: Diagnosis not present

## 2020-01-10 DIAGNOSIS — L89322 Pressure ulcer of left buttock, stage 2: Secondary | ICD-10-CM | POA: Diagnosis not present

## 2020-01-10 DIAGNOSIS — M48061 Spinal stenosis, lumbar region without neurogenic claudication: Secondary | ICD-10-CM | POA: Diagnosis not present

## 2020-01-10 DIAGNOSIS — Z466 Encounter for fitting and adjustment of urinary device: Secondary | ICD-10-CM | POA: Diagnosis not present

## 2020-01-10 DIAGNOSIS — N1831 Chronic kidney disease, stage 3a: Secondary | ICD-10-CM | POA: Diagnosis not present

## 2020-01-10 DIAGNOSIS — I129 Hypertensive chronic kidney disease with stage 1 through stage 4 chronic kidney disease, or unspecified chronic kidney disease: Secondary | ICD-10-CM | POA: Diagnosis not present

## 2020-01-18 DIAGNOSIS — D509 Iron deficiency anemia, unspecified: Secondary | ICD-10-CM | POA: Diagnosis not present

## 2020-01-18 DIAGNOSIS — N1831 Chronic kidney disease, stage 3a: Secondary | ICD-10-CM | POA: Diagnosis not present

## 2020-01-18 DIAGNOSIS — N3946 Mixed incontinence: Secondary | ICD-10-CM | POA: Diagnosis not present

## 2020-01-18 DIAGNOSIS — L89322 Pressure ulcer of left buttock, stage 2: Secondary | ICD-10-CM | POA: Diagnosis not present

## 2020-01-18 DIAGNOSIS — I129 Hypertensive chronic kidney disease with stage 1 through stage 4 chronic kidney disease, or unspecified chronic kidney disease: Secondary | ICD-10-CM | POA: Diagnosis not present

## 2020-01-18 DIAGNOSIS — E1122 Type 2 diabetes mellitus with diabetic chronic kidney disease: Secondary | ICD-10-CM | POA: Diagnosis not present

## 2020-01-18 DIAGNOSIS — M48061 Spinal stenosis, lumbar region without neurogenic claudication: Secondary | ICD-10-CM | POA: Diagnosis not present

## 2020-01-18 DIAGNOSIS — L89312 Pressure ulcer of right buttock, stage 2: Secondary | ICD-10-CM | POA: Diagnosis not present

## 2020-01-18 DIAGNOSIS — Z466 Encounter for fitting and adjustment of urinary device: Secondary | ICD-10-CM | POA: Diagnosis not present

## 2020-01-19 DIAGNOSIS — R0989 Other specified symptoms and signs involving the circulatory and respiratory systems: Secondary | ICD-10-CM | POA: Diagnosis not present

## 2020-01-19 DIAGNOSIS — R0902 Hypoxemia: Secondary | ICD-10-CM | POA: Diagnosis not present

## 2020-01-25 DIAGNOSIS — M48061 Spinal stenosis, lumbar region without neurogenic claudication: Secondary | ICD-10-CM | POA: Diagnosis not present

## 2020-01-25 DIAGNOSIS — L89312 Pressure ulcer of right buttock, stage 2: Secondary | ICD-10-CM | POA: Diagnosis not present

## 2020-01-25 DIAGNOSIS — D509 Iron deficiency anemia, unspecified: Secondary | ICD-10-CM | POA: Diagnosis not present

## 2020-01-25 DIAGNOSIS — N3946 Mixed incontinence: Secondary | ICD-10-CM | POA: Diagnosis not present

## 2020-01-25 DIAGNOSIS — Z466 Encounter for fitting and adjustment of urinary device: Secondary | ICD-10-CM | POA: Diagnosis not present

## 2020-01-25 DIAGNOSIS — I129 Hypertensive chronic kidney disease with stage 1 through stage 4 chronic kidney disease, or unspecified chronic kidney disease: Secondary | ICD-10-CM | POA: Diagnosis not present

## 2020-01-25 DIAGNOSIS — E1122 Type 2 diabetes mellitus with diabetic chronic kidney disease: Secondary | ICD-10-CM | POA: Diagnosis not present

## 2020-01-25 DIAGNOSIS — N1831 Chronic kidney disease, stage 3a: Secondary | ICD-10-CM | POA: Diagnosis not present

## 2020-01-25 DIAGNOSIS — L89322 Pressure ulcer of left buttock, stage 2: Secondary | ICD-10-CM | POA: Diagnosis not present

## 2020-02-02 DIAGNOSIS — R0902 Hypoxemia: Secondary | ICD-10-CM | POA: Diagnosis not present

## 2020-02-02 DIAGNOSIS — E1165 Type 2 diabetes mellitus with hyperglycemia: Secondary | ICD-10-CM | POA: Diagnosis not present

## 2020-02-02 DIAGNOSIS — R069 Unspecified abnormalities of breathing: Secondary | ICD-10-CM | POA: Diagnosis not present

## 2020-02-02 DIAGNOSIS — J189 Pneumonia, unspecified organism: Secondary | ICD-10-CM | POA: Diagnosis not present

## 2020-02-02 DIAGNOSIS — R0602 Shortness of breath: Secondary | ICD-10-CM | POA: Diagnosis not present

## 2020-02-03 DIAGNOSIS — J9811 Atelectasis: Secondary | ICD-10-CM | POA: Diagnosis not present

## 2020-02-03 DIAGNOSIS — J441 Chronic obstructive pulmonary disease with (acute) exacerbation: Secondary | ICD-10-CM | POA: Diagnosis not present

## 2020-02-03 DIAGNOSIS — E872 Acidosis: Secondary | ICD-10-CM | POA: Diagnosis not present

## 2020-02-03 DIAGNOSIS — Z7401 Bed confinement status: Secondary | ICD-10-CM | POA: Diagnosis not present

## 2020-02-03 DIAGNOSIS — N179 Acute kidney failure, unspecified: Secondary | ICD-10-CM | POA: Diagnosis not present

## 2020-02-03 DIAGNOSIS — R0602 Shortness of breath: Secondary | ICD-10-CM | POA: Diagnosis not present

## 2020-02-03 DIAGNOSIS — R0902 Hypoxemia: Secondary | ICD-10-CM | POA: Diagnosis not present

## 2020-02-03 DIAGNOSIS — M2550 Pain in unspecified joint: Secondary | ICD-10-CM | POA: Diagnosis not present

## 2020-02-03 DIAGNOSIS — R7989 Other specified abnormal findings of blood chemistry: Secondary | ICD-10-CM | POA: Diagnosis not present

## 2020-02-03 DIAGNOSIS — E119 Type 2 diabetes mellitus without complications: Secondary | ICD-10-CM | POA: Diagnosis not present

## 2020-02-03 DIAGNOSIS — Z20822 Contact with and (suspected) exposure to covid-19: Secondary | ICD-10-CM | POA: Diagnosis not present

## 2020-02-03 DIAGNOSIS — G9341 Metabolic encephalopathy: Secondary | ICD-10-CM | POA: Diagnosis not present

## 2020-02-03 DIAGNOSIS — I129 Hypertensive chronic kidney disease with stage 1 through stage 4 chronic kidney disease, or unspecified chronic kidney disease: Secondary | ICD-10-CM | POA: Diagnosis not present

## 2020-02-03 DIAGNOSIS — F05 Delirium due to known physiological condition: Secondary | ICD-10-CM | POA: Diagnosis not present

## 2020-02-03 DIAGNOSIS — G9340 Encephalopathy, unspecified: Secondary | ICD-10-CM | POA: Diagnosis not present

## 2020-02-03 DIAGNOSIS — E1122 Type 2 diabetes mellitus with diabetic chronic kidney disease: Secondary | ICD-10-CM | POA: Diagnosis not present

## 2020-02-03 DIAGNOSIS — J9621 Acute and chronic respiratory failure with hypoxia: Secondary | ICD-10-CM | POA: Diagnosis not present

## 2020-02-03 DIAGNOSIS — R069 Unspecified abnormalities of breathing: Secondary | ICD-10-CM | POA: Diagnosis not present

## 2020-02-03 DIAGNOSIS — J440 Chronic obstructive pulmonary disease with acute lower respiratory infection: Secondary | ICD-10-CM | POA: Diagnosis not present

## 2020-02-03 DIAGNOSIS — R509 Fever, unspecified: Secondary | ICD-10-CM | POA: Diagnosis not present

## 2020-02-03 DIAGNOSIS — A419 Sepsis, unspecified organism: Secondary | ICD-10-CM | POA: Diagnosis not present

## 2020-02-03 DIAGNOSIS — J9620 Acute and chronic respiratory failure, unspecified whether with hypoxia or hypercapnia: Secondary | ICD-10-CM | POA: Diagnosis not present

## 2020-02-03 DIAGNOSIS — J449 Chronic obstructive pulmonary disease, unspecified: Secondary | ICD-10-CM | POA: Diagnosis not present

## 2020-02-03 DIAGNOSIS — R6520 Severe sepsis without septic shock: Secondary | ICD-10-CM | POA: Diagnosis not present

## 2020-02-03 DIAGNOSIS — J159 Unspecified bacterial pneumonia: Secondary | ICD-10-CM | POA: Diagnosis not present

## 2020-02-03 DIAGNOSIS — J209 Acute bronchitis, unspecified: Secondary | ICD-10-CM | POA: Diagnosis not present

## 2020-02-03 DIAGNOSIS — N183 Chronic kidney disease, stage 3 unspecified: Secondary | ICD-10-CM | POA: Diagnosis not present

## 2020-02-10 DIAGNOSIS — M48061 Spinal stenosis, lumbar region without neurogenic claudication: Secondary | ICD-10-CM | POA: Diagnosis not present

## 2020-02-10 DIAGNOSIS — J439 Emphysema, unspecified: Secondary | ICD-10-CM | POA: Diagnosis not present

## 2020-02-10 DIAGNOSIS — L89312 Pressure ulcer of right buttock, stage 2: Secondary | ICD-10-CM | POA: Diagnosis not present

## 2020-02-10 DIAGNOSIS — E1122 Type 2 diabetes mellitus with diabetic chronic kidney disease: Secondary | ICD-10-CM | POA: Diagnosis not present

## 2020-02-10 DIAGNOSIS — L89322 Pressure ulcer of left buttock, stage 2: Secondary | ICD-10-CM | POA: Diagnosis not present

## 2020-02-10 DIAGNOSIS — J189 Pneumonia, unspecified organism: Secondary | ICD-10-CM | POA: Diagnosis not present

## 2020-02-10 DIAGNOSIS — I129 Hypertensive chronic kidney disease with stage 1 through stage 4 chronic kidney disease, or unspecified chronic kidney disease: Secondary | ICD-10-CM | POA: Diagnosis not present

## 2020-02-10 DIAGNOSIS — Z466 Encounter for fitting and adjustment of urinary device: Secondary | ICD-10-CM | POA: Diagnosis not present

## 2020-02-10 DIAGNOSIS — J9621 Acute and chronic respiratory failure with hypoxia: Secondary | ICD-10-CM | POA: Diagnosis not present

## 2020-02-12 DIAGNOSIS — M48061 Spinal stenosis, lumbar region without neurogenic claudication: Secondary | ICD-10-CM | POA: Diagnosis not present

## 2020-02-12 DIAGNOSIS — L89312 Pressure ulcer of right buttock, stage 2: Secondary | ICD-10-CM | POA: Diagnosis not present

## 2020-02-12 DIAGNOSIS — L89322 Pressure ulcer of left buttock, stage 2: Secondary | ICD-10-CM | POA: Diagnosis not present

## 2020-02-12 DIAGNOSIS — J189 Pneumonia, unspecified organism: Secondary | ICD-10-CM | POA: Diagnosis not present

## 2020-02-12 DIAGNOSIS — I129 Hypertensive chronic kidney disease with stage 1 through stage 4 chronic kidney disease, or unspecified chronic kidney disease: Secondary | ICD-10-CM | POA: Diagnosis not present

## 2020-02-12 DIAGNOSIS — J439 Emphysema, unspecified: Secondary | ICD-10-CM | POA: Diagnosis not present

## 2020-02-12 DIAGNOSIS — Z466 Encounter for fitting and adjustment of urinary device: Secondary | ICD-10-CM | POA: Diagnosis not present

## 2020-02-12 DIAGNOSIS — J9621 Acute and chronic respiratory failure with hypoxia: Secondary | ICD-10-CM | POA: Diagnosis not present

## 2020-02-12 DIAGNOSIS — E1122 Type 2 diabetes mellitus with diabetic chronic kidney disease: Secondary | ICD-10-CM | POA: Diagnosis not present

## 2020-02-13 DIAGNOSIS — E1122 Type 2 diabetes mellitus with diabetic chronic kidney disease: Secondary | ICD-10-CM | POA: Diagnosis not present

## 2020-02-13 DIAGNOSIS — I129 Hypertensive chronic kidney disease with stage 1 through stage 4 chronic kidney disease, or unspecified chronic kidney disease: Secondary | ICD-10-CM | POA: Diagnosis not present

## 2020-02-13 DIAGNOSIS — J439 Emphysema, unspecified: Secondary | ICD-10-CM | POA: Diagnosis not present

## 2020-02-13 DIAGNOSIS — L89312 Pressure ulcer of right buttock, stage 2: Secondary | ICD-10-CM | POA: Diagnosis not present

## 2020-02-13 DIAGNOSIS — J189 Pneumonia, unspecified organism: Secondary | ICD-10-CM | POA: Diagnosis not present

## 2020-02-13 DIAGNOSIS — J9621 Acute and chronic respiratory failure with hypoxia: Secondary | ICD-10-CM | POA: Diagnosis not present

## 2020-02-13 DIAGNOSIS — Z466 Encounter for fitting and adjustment of urinary device: Secondary | ICD-10-CM | POA: Diagnosis not present

## 2020-02-13 DIAGNOSIS — L89322 Pressure ulcer of left buttock, stage 2: Secondary | ICD-10-CM | POA: Diagnosis not present

## 2020-02-13 DIAGNOSIS — M48061 Spinal stenosis, lumbar region without neurogenic claudication: Secondary | ICD-10-CM | POA: Diagnosis not present

## 2020-02-14 DIAGNOSIS — I129 Hypertensive chronic kidney disease with stage 1 through stage 4 chronic kidney disease, or unspecified chronic kidney disease: Secondary | ICD-10-CM | POA: Diagnosis not present

## 2020-02-14 DIAGNOSIS — J9621 Acute and chronic respiratory failure with hypoxia: Secondary | ICD-10-CM | POA: Diagnosis not present

## 2020-02-14 DIAGNOSIS — F0390 Unspecified dementia without behavioral disturbance: Secondary | ICD-10-CM | POA: Diagnosis not present

## 2020-02-14 DIAGNOSIS — E872 Acidosis: Secondary | ICD-10-CM | POA: Diagnosis not present

## 2020-02-14 DIAGNOSIS — J189 Pneumonia, unspecified organism: Secondary | ICD-10-CM | POA: Diagnosis not present

## 2020-02-14 DIAGNOSIS — J441 Chronic obstructive pulmonary disease with (acute) exacerbation: Secondary | ICD-10-CM | POA: Diagnosis not present

## 2020-02-14 DIAGNOSIS — R4020 Unspecified coma: Secondary | ICD-10-CM | POA: Diagnosis not present

## 2020-02-14 DIAGNOSIS — R0902 Hypoxemia: Secondary | ICD-10-CM | POA: Diagnosis not present

## 2020-02-14 DIAGNOSIS — G92 Toxic encephalopathy: Secondary | ICD-10-CM | POA: Diagnosis not present

## 2020-02-14 DIAGNOSIS — L89159 Pressure ulcer of sacral region, unspecified stage: Secondary | ICD-10-CM | POA: Diagnosis not present

## 2020-02-14 DIAGNOSIS — A419 Sepsis, unspecified organism: Secondary | ICD-10-CM | POA: Diagnosis not present

## 2020-02-14 DIAGNOSIS — Z87891 Personal history of nicotine dependence: Secondary | ICD-10-CM | POA: Diagnosis not present

## 2020-02-14 DIAGNOSIS — J9811 Atelectasis: Secondary | ICD-10-CM | POA: Diagnosis not present

## 2020-02-14 DIAGNOSIS — R9431 Abnormal electrocardiogram [ECG] [EKG]: Secondary | ICD-10-CM | POA: Diagnosis not present

## 2020-02-14 DIAGNOSIS — R0602 Shortness of breath: Secondary | ICD-10-CM | POA: Diagnosis not present

## 2020-02-14 DIAGNOSIS — I959 Hypotension, unspecified: Secondary | ICD-10-CM | POA: Diagnosis not present

## 2020-02-14 DIAGNOSIS — R4182 Altered mental status, unspecified: Secondary | ICD-10-CM | POA: Diagnosis not present

## 2020-02-14 DIAGNOSIS — N179 Acute kidney failure, unspecified: Secondary | ICD-10-CM | POA: Diagnosis not present

## 2020-02-14 DIAGNOSIS — Z9981 Dependence on supplemental oxygen: Secondary | ICD-10-CM | POA: Diagnosis not present

## 2020-02-15 DIAGNOSIS — A419 Sepsis, unspecified organism: Secondary | ICD-10-CM | POA: Diagnosis not present

## 2020-02-15 DIAGNOSIS — E872 Acidosis: Secondary | ICD-10-CM | POA: Diagnosis not present

## 2020-02-15 DIAGNOSIS — N183 Chronic kidney disease, stage 3 unspecified: Secondary | ICD-10-CM | POA: Diagnosis not present

## 2020-02-15 DIAGNOSIS — E1122 Type 2 diabetes mellitus with diabetic chronic kidney disease: Secondary | ICD-10-CM | POA: Diagnosis not present

## 2020-02-15 DIAGNOSIS — I129 Hypertensive chronic kidney disease with stage 1 through stage 4 chronic kidney disease, or unspecified chronic kidney disease: Secondary | ICD-10-CM | POA: Diagnosis not present

## 2020-02-15 DIAGNOSIS — N179 Acute kidney failure, unspecified: Secondary | ICD-10-CM | POA: Diagnosis not present

## 2020-02-15 DIAGNOSIS — F0390 Unspecified dementia without behavioral disturbance: Secondary | ICD-10-CM | POA: Diagnosis not present

## 2020-02-15 DIAGNOSIS — J9621 Acute and chronic respiratory failure with hypoxia: Secondary | ICD-10-CM | POA: Diagnosis not present

## 2020-02-15 DIAGNOSIS — J441 Chronic obstructive pulmonary disease with (acute) exacerbation: Secondary | ICD-10-CM | POA: Diagnosis not present

## 2020-02-16 DIAGNOSIS — N183 Chronic kidney disease, stage 3 unspecified: Secondary | ICD-10-CM | POA: Diagnosis not present

## 2020-02-16 DIAGNOSIS — N179 Acute kidney failure, unspecified: Secondary | ICD-10-CM | POA: Diagnosis not present

## 2020-02-16 DIAGNOSIS — E1122 Type 2 diabetes mellitus with diabetic chronic kidney disease: Secondary | ICD-10-CM | POA: Diagnosis not present

## 2020-02-16 DIAGNOSIS — J9621 Acute and chronic respiratory failure with hypoxia: Secondary | ICD-10-CM | POA: Diagnosis not present

## 2020-02-16 DIAGNOSIS — R0600 Dyspnea, unspecified: Secondary | ICD-10-CM | POA: Diagnosis not present

## 2020-02-16 DIAGNOSIS — E872 Acidosis: Secondary | ICD-10-CM | POA: Diagnosis not present

## 2020-02-16 DIAGNOSIS — I129 Hypertensive chronic kidney disease with stage 1 through stage 4 chronic kidney disease, or unspecified chronic kidney disease: Secondary | ICD-10-CM | POA: Diagnosis not present

## 2020-02-16 DIAGNOSIS — R062 Wheezing: Secondary | ICD-10-CM | POA: Diagnosis not present

## 2020-02-16 DIAGNOSIS — A419 Sepsis, unspecified organism: Secondary | ICD-10-CM | POA: Diagnosis not present

## 2020-02-16 DIAGNOSIS — F0390 Unspecified dementia without behavioral disturbance: Secondary | ICD-10-CM | POA: Diagnosis not present

## 2020-02-16 DIAGNOSIS — J441 Chronic obstructive pulmonary disease with (acute) exacerbation: Secondary | ICD-10-CM | POA: Diagnosis not present

## 2020-02-17 DIAGNOSIS — M2550 Pain in unspecified joint: Secondary | ICD-10-CM | POA: Diagnosis not present

## 2020-02-17 DIAGNOSIS — E1122 Type 2 diabetes mellitus with diabetic chronic kidney disease: Secondary | ICD-10-CM | POA: Diagnosis not present

## 2020-02-17 DIAGNOSIS — N179 Acute kidney failure, unspecified: Secondary | ICD-10-CM | POA: Diagnosis not present

## 2020-02-17 DIAGNOSIS — A419 Sepsis, unspecified organism: Secondary | ICD-10-CM | POA: Diagnosis not present

## 2020-02-17 DIAGNOSIS — I739 Peripheral vascular disease, unspecified: Secondary | ICD-10-CM | POA: Diagnosis not present

## 2020-02-17 DIAGNOSIS — N183 Chronic kidney disease, stage 3 unspecified: Secondary | ICD-10-CM | POA: Diagnosis not present

## 2020-02-17 DIAGNOSIS — J9621 Acute and chronic respiratory failure with hypoxia: Secondary | ICD-10-CM | POA: Diagnosis not present

## 2020-02-17 DIAGNOSIS — R0602 Shortness of breath: Secondary | ICD-10-CM | POA: Diagnosis not present

## 2020-02-17 DIAGNOSIS — R29898 Other symptoms and signs involving the musculoskeletal system: Secondary | ICD-10-CM | POA: Diagnosis not present

## 2020-02-17 DIAGNOSIS — F0390 Unspecified dementia without behavioral disturbance: Secondary | ICD-10-CM | POA: Diagnosis not present

## 2020-02-17 DIAGNOSIS — Z7401 Bed confinement status: Secondary | ICD-10-CM | POA: Diagnosis not present

## 2020-02-17 DIAGNOSIS — I129 Hypertensive chronic kidney disease with stage 1 through stage 4 chronic kidney disease, or unspecified chronic kidney disease: Secondary | ICD-10-CM | POA: Diagnosis not present

## 2020-02-17 DIAGNOSIS — R410 Disorientation, unspecified: Secondary | ICD-10-CM | POA: Diagnosis not present

## 2020-02-19 DIAGNOSIS — J439 Emphysema, unspecified: Secondary | ICD-10-CM | POA: Diagnosis not present

## 2020-02-19 DIAGNOSIS — M48061 Spinal stenosis, lumbar region without neurogenic claudication: Secondary | ICD-10-CM | POA: Diagnosis not present

## 2020-02-19 DIAGNOSIS — I129 Hypertensive chronic kidney disease with stage 1 through stage 4 chronic kidney disease, or unspecified chronic kidney disease: Secondary | ICD-10-CM | POA: Diagnosis not present

## 2020-02-19 DIAGNOSIS — Z466 Encounter for fitting and adjustment of urinary device: Secondary | ICD-10-CM | POA: Diagnosis not present

## 2020-02-19 DIAGNOSIS — E1122 Type 2 diabetes mellitus with diabetic chronic kidney disease: Secondary | ICD-10-CM | POA: Diagnosis not present

## 2020-02-19 DIAGNOSIS — J189 Pneumonia, unspecified organism: Secondary | ICD-10-CM | POA: Diagnosis not present

## 2020-02-19 DIAGNOSIS — J9621 Acute and chronic respiratory failure with hypoxia: Secondary | ICD-10-CM | POA: Diagnosis not present

## 2020-02-19 DIAGNOSIS — L89322 Pressure ulcer of left buttock, stage 2: Secondary | ICD-10-CM | POA: Diagnosis not present

## 2020-02-19 DIAGNOSIS — L89312 Pressure ulcer of right buttock, stage 2: Secondary | ICD-10-CM | POA: Diagnosis not present

## 2020-02-23 DIAGNOSIS — G9340 Encephalopathy, unspecified: Secondary | ICD-10-CM | POA: Diagnosis not present

## 2020-02-23 DIAGNOSIS — R0602 Shortness of breath: Secondary | ICD-10-CM | POA: Diagnosis not present

## 2020-02-23 DIAGNOSIS — E1122 Type 2 diabetes mellitus with diabetic chronic kidney disease: Secondary | ICD-10-CM | POA: Diagnosis not present

## 2020-02-23 DIAGNOSIS — N400 Enlarged prostate without lower urinary tract symptoms: Secondary | ICD-10-CM | POA: Diagnosis not present

## 2020-02-23 DIAGNOSIS — F419 Anxiety disorder, unspecified: Secondary | ICD-10-CM | POA: Diagnosis not present

## 2020-02-23 DIAGNOSIS — E1140 Type 2 diabetes mellitus with diabetic neuropathy, unspecified: Secondary | ICD-10-CM | POA: Diagnosis not present

## 2020-02-23 DIAGNOSIS — N183 Chronic kidney disease, stage 3 unspecified: Secondary | ICD-10-CM | POA: Diagnosis not present

## 2020-02-23 DIAGNOSIS — F0390 Unspecified dementia without behavioral disturbance: Secondary | ICD-10-CM | POA: Diagnosis not present

## 2020-02-23 DIAGNOSIS — N179 Acute kidney failure, unspecified: Secondary | ICD-10-CM | POA: Diagnosis not present

## 2020-02-23 DIAGNOSIS — R29810 Facial weakness: Secondary | ICD-10-CM | POA: Diagnosis not present

## 2020-02-23 DIAGNOSIS — J189 Pneumonia, unspecified organism: Secondary | ICD-10-CM | POA: Diagnosis not present

## 2020-02-23 DIAGNOSIS — I129 Hypertensive chronic kidney disease with stage 1 through stage 4 chronic kidney disease, or unspecified chronic kidney disease: Secondary | ICD-10-CM | POA: Diagnosis not present

## 2020-02-23 DIAGNOSIS — R410 Disorientation, unspecified: Secondary | ICD-10-CM | POA: Diagnosis not present

## 2020-02-23 DIAGNOSIS — J90 Pleural effusion, not elsewhere classified: Secondary | ICD-10-CM | POA: Diagnosis not present

## 2020-02-23 DIAGNOSIS — R404 Transient alteration of awareness: Secondary | ICD-10-CM | POA: Diagnosis not present

## 2020-02-23 DIAGNOSIS — I498 Other specified cardiac arrhythmias: Secondary | ICD-10-CM | POA: Diagnosis not present

## 2020-02-23 DIAGNOSIS — E1165 Type 2 diabetes mellitus with hyperglycemia: Secondary | ICD-10-CM | POA: Diagnosis not present

## 2020-02-23 DIAGNOSIS — I517 Cardiomegaly: Secondary | ICD-10-CM | POA: Diagnosis not present

## 2020-02-23 DIAGNOSIS — J449 Chronic obstructive pulmonary disease, unspecified: Secondary | ICD-10-CM | POA: Diagnosis not present

## 2020-02-23 DIAGNOSIS — F329 Major depressive disorder, single episode, unspecified: Secondary | ICD-10-CM | POA: Diagnosis not present

## 2020-02-24 DIAGNOSIS — G9340 Encephalopathy, unspecified: Secondary | ICD-10-CM | POA: Diagnosis not present

## 2020-02-24 DIAGNOSIS — F419 Anxiety disorder, unspecified: Secondary | ICD-10-CM | POA: Diagnosis not present

## 2020-02-24 DIAGNOSIS — F329 Major depressive disorder, single episode, unspecified: Secondary | ICD-10-CM | POA: Diagnosis not present

## 2020-02-24 DIAGNOSIS — E1122 Type 2 diabetes mellitus with diabetic chronic kidney disease: Secondary | ICD-10-CM | POA: Diagnosis not present

## 2020-02-24 DIAGNOSIS — R0602 Shortness of breath: Secondary | ICD-10-CM | POA: Diagnosis not present

## 2020-02-24 DIAGNOSIS — N183 Chronic kidney disease, stage 3 unspecified: Secondary | ICD-10-CM | POA: Diagnosis not present

## 2020-02-24 DIAGNOSIS — F0390 Unspecified dementia without behavioral disturbance: Secondary | ICD-10-CM | POA: Diagnosis not present

## 2020-02-24 DIAGNOSIS — I129 Hypertensive chronic kidney disease with stage 1 through stage 4 chronic kidney disease, or unspecified chronic kidney disease: Secondary | ICD-10-CM | POA: Diagnosis not present

## 2020-02-24 DIAGNOSIS — N400 Enlarged prostate without lower urinary tract symptoms: Secondary | ICD-10-CM | POA: Diagnosis not present

## 2020-02-25 DIAGNOSIS — N179 Acute kidney failure, unspecified: Secondary | ICD-10-CM | POA: Diagnosis not present

## 2020-02-25 DIAGNOSIS — E1122 Type 2 diabetes mellitus with diabetic chronic kidney disease: Secondary | ICD-10-CM | POA: Diagnosis not present

## 2020-02-25 DIAGNOSIS — N400 Enlarged prostate without lower urinary tract symptoms: Secondary | ICD-10-CM | POA: Diagnosis not present

## 2020-02-25 DIAGNOSIS — F0390 Unspecified dementia without behavioral disturbance: Secondary | ICD-10-CM | POA: Diagnosis not present

## 2020-02-25 DIAGNOSIS — N183 Chronic kidney disease, stage 3 unspecified: Secondary | ICD-10-CM | POA: Diagnosis not present

## 2020-02-25 DIAGNOSIS — I129 Hypertensive chronic kidney disease with stage 1 through stage 4 chronic kidney disease, or unspecified chronic kidney disease: Secondary | ICD-10-CM | POA: Diagnosis not present

## 2020-02-25 DIAGNOSIS — L89152 Pressure ulcer of sacral region, stage 2: Secondary | ICD-10-CM | POA: Diagnosis not present

## 2020-02-25 DIAGNOSIS — G9340 Encephalopathy, unspecified: Secondary | ICD-10-CM | POA: Diagnosis not present

## 2020-02-25 DIAGNOSIS — J449 Chronic obstructive pulmonary disease, unspecified: Secondary | ICD-10-CM | POA: Diagnosis not present

## 2020-02-26 DIAGNOSIS — R5381 Other malaise: Secondary | ICD-10-CM | POA: Diagnosis not present

## 2020-02-26 DIAGNOSIS — Z7401 Bed confinement status: Secondary | ICD-10-CM | POA: Diagnosis not present

## 2020-02-26 DIAGNOSIS — F0390 Unspecified dementia without behavioral disturbance: Secondary | ICD-10-CM | POA: Diagnosis not present

## 2020-02-26 DIAGNOSIS — N179 Acute kidney failure, unspecified: Secondary | ICD-10-CM | POA: Diagnosis not present

## 2020-02-26 DIAGNOSIS — L89152 Pressure ulcer of sacral region, stage 2: Secondary | ICD-10-CM | POA: Diagnosis not present

## 2020-02-26 DIAGNOSIS — G9340 Encephalopathy, unspecified: Secondary | ICD-10-CM | POA: Diagnosis not present

## 2020-02-26 DIAGNOSIS — R531 Weakness: Secondary | ICD-10-CM | POA: Diagnosis not present

## 2020-02-26 DIAGNOSIS — N400 Enlarged prostate without lower urinary tract symptoms: Secondary | ICD-10-CM | POA: Diagnosis not present

## 2020-02-26 DIAGNOSIS — J189 Pneumonia, unspecified organism: Secondary | ICD-10-CM | POA: Diagnosis not present

## 2020-02-26 DIAGNOSIS — E1122 Type 2 diabetes mellitus with diabetic chronic kidney disease: Secondary | ICD-10-CM | POA: Diagnosis not present

## 2020-02-26 DIAGNOSIS — I129 Hypertensive chronic kidney disease with stage 1 through stage 4 chronic kidney disease, or unspecified chronic kidney disease: Secondary | ICD-10-CM | POA: Diagnosis not present

## 2020-02-26 DIAGNOSIS — M2550 Pain in unspecified joint: Secondary | ICD-10-CM | POA: Diagnosis not present

## 2020-02-26 DIAGNOSIS — N183 Chronic kidney disease, stage 3 unspecified: Secondary | ICD-10-CM | POA: Diagnosis not present

## 2020-02-26 DIAGNOSIS — Z515 Encounter for palliative care: Secondary | ICD-10-CM | POA: Diagnosis not present

## 2020-02-26 DIAGNOSIS — J9611 Chronic respiratory failure with hypoxia: Secondary | ICD-10-CM | POA: Diagnosis not present

## 2020-02-26 DIAGNOSIS — J449 Chronic obstructive pulmonary disease, unspecified: Secondary | ICD-10-CM | POA: Diagnosis not present

## 2020-02-27 DIAGNOSIS — I313 Pericardial effusion (noninflammatory): Secondary | ICD-10-CM | POA: Diagnosis not present

## 2020-03-06 DIAGNOSIS — N1831 Chronic kidney disease, stage 3a: Secondary | ICD-10-CM | POA: Diagnosis not present

## 2020-03-06 DIAGNOSIS — J449 Chronic obstructive pulmonary disease, unspecified: Secondary | ICD-10-CM | POA: Diagnosis not present

## 2020-03-06 DIAGNOSIS — M79604 Pain in right leg: Secondary | ICD-10-CM | POA: Diagnosis not present

## 2020-03-06 DIAGNOSIS — Z741 Need for assistance with personal care: Secondary | ICD-10-CM | POA: Diagnosis not present

## 2020-03-06 DIAGNOSIS — N3946 Mixed incontinence: Secondary | ICD-10-CM | POA: Diagnosis not present

## 2020-03-06 DIAGNOSIS — Z9981 Dependence on supplemental oxygen: Secondary | ICD-10-CM | POA: Diagnosis not present

## 2020-03-06 DIAGNOSIS — I129 Hypertensive chronic kidney disease with stage 1 through stage 4 chronic kidney disease, or unspecified chronic kidney disease: Secondary | ICD-10-CM | POA: Diagnosis not present

## 2020-03-06 DIAGNOSIS — Z66 Do not resuscitate: Secondary | ICD-10-CM | POA: Diagnosis not present

## 2020-03-06 DIAGNOSIS — E1122 Type 2 diabetes mellitus with diabetic chronic kidney disease: Secondary | ICD-10-CM | POA: Diagnosis not present

## 2020-03-08 ENCOUNTER — Telehealth: Payer: Self-pay | Admitting: Hospice

## 2020-03-08 NOTE — Telephone Encounter (Signed)
Spoke with patient's daughter, [name], regarding Palliative services and she was in agreement with this.  I have scheduled an In-person Consult for 03/20/20 @ 12:30 PM.

## 2020-03-19 ENCOUNTER — Telehealth: Payer: Self-pay | Admitting: Hospice

## 2020-03-19 NOTE — Telephone Encounter (Signed)
Spoke with daughter, [name], and let her know that we needed to reschedule the In-home Palliative Consult scheduled for 03/20/20 due to NP having family emergency.  This was rescheduled for 04/08/20 @ 11 AM.

## 2020-03-20 ENCOUNTER — Other Ambulatory Visit: Payer: Self-pay | Admitting: Hospice

## 2020-03-20 DIAGNOSIS — L89312 Pressure ulcer of right buttock, stage 2: Secondary | ICD-10-CM | POA: Diagnosis not present

## 2020-03-20 DIAGNOSIS — J439 Emphysema, unspecified: Secondary | ICD-10-CM | POA: Diagnosis not present

## 2020-03-20 DIAGNOSIS — J9611 Chronic respiratory failure with hypoxia: Secondary | ICD-10-CM | POA: Diagnosis not present

## 2020-03-20 DIAGNOSIS — E1122 Type 2 diabetes mellitus with diabetic chronic kidney disease: Secondary | ICD-10-CM | POA: Diagnosis not present

## 2020-03-20 DIAGNOSIS — E1151 Type 2 diabetes mellitus with diabetic peripheral angiopathy without gangrene: Secondary | ICD-10-CM | POA: Diagnosis not present

## 2020-03-20 DIAGNOSIS — M48061 Spinal stenosis, lumbar region without neurogenic claudication: Secondary | ICD-10-CM | POA: Diagnosis not present

## 2020-03-20 DIAGNOSIS — J189 Pneumonia, unspecified organism: Secondary | ICD-10-CM | POA: Diagnosis not present

## 2020-03-20 DIAGNOSIS — Z466 Encounter for fitting and adjustment of urinary device: Secondary | ICD-10-CM | POA: Diagnosis not present

## 2020-03-20 DIAGNOSIS — I129 Hypertensive chronic kidney disease with stage 1 through stage 4 chronic kidney disease, or unspecified chronic kidney disease: Secondary | ICD-10-CM | POA: Diagnosis not present

## 2020-03-22 DIAGNOSIS — J439 Emphysema, unspecified: Secondary | ICD-10-CM | POA: Diagnosis not present

## 2020-03-22 DIAGNOSIS — J9611 Chronic respiratory failure with hypoxia: Secondary | ICD-10-CM | POA: Diagnosis not present

## 2020-03-22 DIAGNOSIS — E1151 Type 2 diabetes mellitus with diabetic peripheral angiopathy without gangrene: Secondary | ICD-10-CM | POA: Diagnosis not present

## 2020-03-22 DIAGNOSIS — E1122 Type 2 diabetes mellitus with diabetic chronic kidney disease: Secondary | ICD-10-CM | POA: Diagnosis not present

## 2020-03-22 DIAGNOSIS — L89312 Pressure ulcer of right buttock, stage 2: Secondary | ICD-10-CM | POA: Diagnosis not present

## 2020-03-22 DIAGNOSIS — Z466 Encounter for fitting and adjustment of urinary device: Secondary | ICD-10-CM | POA: Diagnosis not present

## 2020-03-22 DIAGNOSIS — M48061 Spinal stenosis, lumbar region without neurogenic claudication: Secondary | ICD-10-CM | POA: Diagnosis not present

## 2020-03-22 DIAGNOSIS — I129 Hypertensive chronic kidney disease with stage 1 through stage 4 chronic kidney disease, or unspecified chronic kidney disease: Secondary | ICD-10-CM | POA: Diagnosis not present

## 2020-03-22 DIAGNOSIS — J189 Pneumonia, unspecified organism: Secondary | ICD-10-CM | POA: Diagnosis not present

## 2020-03-26 DIAGNOSIS — I129 Hypertensive chronic kidney disease with stage 1 through stage 4 chronic kidney disease, or unspecified chronic kidney disease: Secondary | ICD-10-CM | POA: Diagnosis not present

## 2020-03-26 DIAGNOSIS — E1151 Type 2 diabetes mellitus with diabetic peripheral angiopathy without gangrene: Secondary | ICD-10-CM | POA: Diagnosis not present

## 2020-03-26 DIAGNOSIS — J189 Pneumonia, unspecified organism: Secondary | ICD-10-CM | POA: Diagnosis not present

## 2020-03-26 DIAGNOSIS — E1122 Type 2 diabetes mellitus with diabetic chronic kidney disease: Secondary | ICD-10-CM | POA: Diagnosis not present

## 2020-03-26 DIAGNOSIS — M48061 Spinal stenosis, lumbar region without neurogenic claudication: Secondary | ICD-10-CM | POA: Diagnosis not present

## 2020-03-26 DIAGNOSIS — J439 Emphysema, unspecified: Secondary | ICD-10-CM | POA: Diagnosis not present

## 2020-03-26 DIAGNOSIS — J9611 Chronic respiratory failure with hypoxia: Secondary | ICD-10-CM | POA: Diagnosis not present

## 2020-03-26 DIAGNOSIS — L89312 Pressure ulcer of right buttock, stage 2: Secondary | ICD-10-CM | POA: Diagnosis not present

## 2020-03-26 DIAGNOSIS — Z466 Encounter for fitting and adjustment of urinary device: Secondary | ICD-10-CM | POA: Diagnosis not present

## 2020-03-28 DIAGNOSIS — E1122 Type 2 diabetes mellitus with diabetic chronic kidney disease: Secondary | ICD-10-CM | POA: Diagnosis not present

## 2020-03-28 DIAGNOSIS — I129 Hypertensive chronic kidney disease with stage 1 through stage 4 chronic kidney disease, or unspecified chronic kidney disease: Secondary | ICD-10-CM | POA: Diagnosis not present

## 2020-03-28 DIAGNOSIS — E1151 Type 2 diabetes mellitus with diabetic peripheral angiopathy without gangrene: Secondary | ICD-10-CM | POA: Diagnosis not present

## 2020-03-28 DIAGNOSIS — J189 Pneumonia, unspecified organism: Secondary | ICD-10-CM | POA: Diagnosis not present

## 2020-03-28 DIAGNOSIS — J9611 Chronic respiratory failure with hypoxia: Secondary | ICD-10-CM | POA: Diagnosis not present

## 2020-03-28 DIAGNOSIS — M48061 Spinal stenosis, lumbar region without neurogenic claudication: Secondary | ICD-10-CM | POA: Diagnosis not present

## 2020-03-28 DIAGNOSIS — L89312 Pressure ulcer of right buttock, stage 2: Secondary | ICD-10-CM | POA: Diagnosis not present

## 2020-03-28 DIAGNOSIS — J439 Emphysema, unspecified: Secondary | ICD-10-CM | POA: Diagnosis not present

## 2020-03-28 DIAGNOSIS — Z466 Encounter for fitting and adjustment of urinary device: Secondary | ICD-10-CM | POA: Diagnosis not present

## 2020-04-02 DIAGNOSIS — E1151 Type 2 diabetes mellitus with diabetic peripheral angiopathy without gangrene: Secondary | ICD-10-CM | POA: Diagnosis not present

## 2020-04-02 DIAGNOSIS — I129 Hypertensive chronic kidney disease with stage 1 through stage 4 chronic kidney disease, or unspecified chronic kidney disease: Secondary | ICD-10-CM | POA: Diagnosis not present

## 2020-04-02 DIAGNOSIS — Z466 Encounter for fitting and adjustment of urinary device: Secondary | ICD-10-CM | POA: Diagnosis not present

## 2020-04-02 DIAGNOSIS — E1122 Type 2 diabetes mellitus with diabetic chronic kidney disease: Secondary | ICD-10-CM | POA: Diagnosis not present

## 2020-04-02 DIAGNOSIS — J9611 Chronic respiratory failure with hypoxia: Secondary | ICD-10-CM | POA: Diagnosis not present

## 2020-04-02 DIAGNOSIS — L89312 Pressure ulcer of right buttock, stage 2: Secondary | ICD-10-CM | POA: Diagnosis not present

## 2020-04-02 DIAGNOSIS — J439 Emphysema, unspecified: Secondary | ICD-10-CM | POA: Diagnosis not present

## 2020-04-02 DIAGNOSIS — M48061 Spinal stenosis, lumbar region without neurogenic claudication: Secondary | ICD-10-CM | POA: Diagnosis not present

## 2020-04-02 DIAGNOSIS — J189 Pneumonia, unspecified organism: Secondary | ICD-10-CM | POA: Diagnosis not present

## 2020-04-04 DIAGNOSIS — J439 Emphysema, unspecified: Secondary | ICD-10-CM | POA: Diagnosis not present

## 2020-04-04 DIAGNOSIS — E1122 Type 2 diabetes mellitus with diabetic chronic kidney disease: Secondary | ICD-10-CM | POA: Diagnosis not present

## 2020-04-04 DIAGNOSIS — L89312 Pressure ulcer of right buttock, stage 2: Secondary | ICD-10-CM | POA: Diagnosis not present

## 2020-04-04 DIAGNOSIS — E1151 Type 2 diabetes mellitus with diabetic peripheral angiopathy without gangrene: Secondary | ICD-10-CM | POA: Diagnosis not present

## 2020-04-04 DIAGNOSIS — J189 Pneumonia, unspecified organism: Secondary | ICD-10-CM | POA: Diagnosis not present

## 2020-04-04 DIAGNOSIS — J9611 Chronic respiratory failure with hypoxia: Secondary | ICD-10-CM | POA: Diagnosis not present

## 2020-04-04 DIAGNOSIS — Z466 Encounter for fitting and adjustment of urinary device: Secondary | ICD-10-CM | POA: Diagnosis not present

## 2020-04-04 DIAGNOSIS — M48061 Spinal stenosis, lumbar region without neurogenic claudication: Secondary | ICD-10-CM | POA: Diagnosis not present

## 2020-04-04 DIAGNOSIS — I129 Hypertensive chronic kidney disease with stage 1 through stage 4 chronic kidney disease, or unspecified chronic kidney disease: Secondary | ICD-10-CM | POA: Diagnosis not present

## 2020-04-05 DIAGNOSIS — J9811 Atelectasis: Secondary | ICD-10-CM | POA: Diagnosis not present

## 2020-04-08 ENCOUNTER — Other Ambulatory Visit: Payer: Medicare HMO | Admitting: Hospice

## 2020-04-08 ENCOUNTER — Other Ambulatory Visit: Payer: Self-pay

## 2020-04-08 DIAGNOSIS — J439 Emphysema, unspecified: Secondary | ICD-10-CM | POA: Diagnosis not present

## 2020-04-08 DIAGNOSIS — L89312 Pressure ulcer of right buttock, stage 2: Secondary | ICD-10-CM | POA: Diagnosis not present

## 2020-04-08 DIAGNOSIS — Z466 Encounter for fitting and adjustment of urinary device: Secondary | ICD-10-CM | POA: Diagnosis not present

## 2020-04-08 DIAGNOSIS — E1122 Type 2 diabetes mellitus with diabetic chronic kidney disease: Secondary | ICD-10-CM | POA: Diagnosis not present

## 2020-04-08 DIAGNOSIS — M48061 Spinal stenosis, lumbar region without neurogenic claudication: Secondary | ICD-10-CM | POA: Diagnosis not present

## 2020-04-08 DIAGNOSIS — I129 Hypertensive chronic kidney disease with stage 1 through stage 4 chronic kidney disease, or unspecified chronic kidney disease: Secondary | ICD-10-CM | POA: Diagnosis not present

## 2020-04-08 DIAGNOSIS — J189 Pneumonia, unspecified organism: Secondary | ICD-10-CM | POA: Diagnosis not present

## 2020-04-08 DIAGNOSIS — J9611 Chronic respiratory failure with hypoxia: Secondary | ICD-10-CM | POA: Diagnosis not present

## 2020-04-08 DIAGNOSIS — E1151 Type 2 diabetes mellitus with diabetic peripheral angiopathy without gangrene: Secondary | ICD-10-CM | POA: Diagnosis not present

## 2020-04-08 NOTE — Progress Notes (Signed)
This visit did not hold. [name] [name] said she is cancelling visit and would like to have about 3 weeks to determine palliative care. She said [facility] was seeing patient for his wound and she wants to see how that goes. Palliative will follow up in 3 weeks.

## 2020-04-10 DIAGNOSIS — J9811 Atelectasis: Secondary | ICD-10-CM | POA: Diagnosis not present

## 2020-04-10 DIAGNOSIS — E1151 Type 2 diabetes mellitus with diabetic peripheral angiopathy without gangrene: Secondary | ICD-10-CM | POA: Diagnosis not present

## 2020-04-10 DIAGNOSIS — Z466 Encounter for fitting and adjustment of urinary device: Secondary | ICD-10-CM | POA: Diagnosis not present

## 2020-04-10 DIAGNOSIS — N1831 Chronic kidney disease, stage 3a: Secondary | ICD-10-CM | POA: Diagnosis not present

## 2020-04-10 DIAGNOSIS — J9611 Chronic respiratory failure with hypoxia: Secondary | ICD-10-CM | POA: Diagnosis not present

## 2020-04-10 DIAGNOSIS — J439 Emphysema, unspecified: Secondary | ICD-10-CM | POA: Diagnosis not present

## 2020-04-10 DIAGNOSIS — I129 Hypertensive chronic kidney disease with stage 1 through stage 4 chronic kidney disease, or unspecified chronic kidney disease: Secondary | ICD-10-CM | POA: Diagnosis not present

## 2020-04-10 DIAGNOSIS — L89312 Pressure ulcer of right buttock, stage 2: Secondary | ICD-10-CM | POA: Diagnosis not present

## 2020-04-10 DIAGNOSIS — E1122 Type 2 diabetes mellitus with diabetic chronic kidney disease: Secondary | ICD-10-CM | POA: Diagnosis not present

## 2020-04-11 DIAGNOSIS — N1831 Chronic kidney disease, stage 3a: Secondary | ICD-10-CM | POA: Diagnosis not present

## 2020-04-11 DIAGNOSIS — J439 Emphysema, unspecified: Secondary | ICD-10-CM | POA: Diagnosis not present

## 2020-04-11 DIAGNOSIS — L89312 Pressure ulcer of right buttock, stage 2: Secondary | ICD-10-CM | POA: Diagnosis not present

## 2020-04-11 DIAGNOSIS — Z466 Encounter for fitting and adjustment of urinary device: Secondary | ICD-10-CM | POA: Diagnosis not present

## 2020-04-11 DIAGNOSIS — E1122 Type 2 diabetes mellitus with diabetic chronic kidney disease: Secondary | ICD-10-CM | POA: Diagnosis not present

## 2020-04-11 DIAGNOSIS — J9811 Atelectasis: Secondary | ICD-10-CM | POA: Diagnosis not present

## 2020-04-11 DIAGNOSIS — J9611 Chronic respiratory failure with hypoxia: Secondary | ICD-10-CM | POA: Diagnosis not present

## 2020-04-11 DIAGNOSIS — I129 Hypertensive chronic kidney disease with stage 1 through stage 4 chronic kidney disease, or unspecified chronic kidney disease: Secondary | ICD-10-CM | POA: Diagnosis not present

## 2020-04-11 DIAGNOSIS — E1151 Type 2 diabetes mellitus with diabetic peripheral angiopathy without gangrene: Secondary | ICD-10-CM | POA: Diagnosis not present

## 2020-04-16 ENCOUNTER — Telehealth: Payer: Self-pay | Admitting: Hospice

## 2020-04-16 DIAGNOSIS — J9611 Chronic respiratory failure with hypoxia: Secondary | ICD-10-CM | POA: Diagnosis not present

## 2020-04-16 DIAGNOSIS — J9811 Atelectasis: Secondary | ICD-10-CM | POA: Diagnosis not present

## 2020-04-16 DIAGNOSIS — N1831 Chronic kidney disease, stage 3a: Secondary | ICD-10-CM | POA: Diagnosis not present

## 2020-04-16 DIAGNOSIS — L89312 Pressure ulcer of right buttock, stage 2: Secondary | ICD-10-CM | POA: Diagnosis not present

## 2020-04-16 DIAGNOSIS — I129 Hypertensive chronic kidney disease with stage 1 through stage 4 chronic kidney disease, or unspecified chronic kidney disease: Secondary | ICD-10-CM | POA: Diagnosis not present

## 2020-04-16 DIAGNOSIS — E1122 Type 2 diabetes mellitus with diabetic chronic kidney disease: Secondary | ICD-10-CM | POA: Diagnosis not present

## 2020-04-16 DIAGNOSIS — E1151 Type 2 diabetes mellitus with diabetic peripheral angiopathy without gangrene: Secondary | ICD-10-CM | POA: Diagnosis not present

## 2020-04-16 DIAGNOSIS — J439 Emphysema, unspecified: Secondary | ICD-10-CM | POA: Diagnosis not present

## 2020-04-16 DIAGNOSIS — Z466 Encounter for fitting and adjustment of urinary device: Secondary | ICD-10-CM | POA: Diagnosis not present

## 2020-04-16 NOTE — Telephone Encounter (Signed)
Scheduled [facility] Palliative visit for 05-02-20 at 10:00.

## 2020-04-18 DIAGNOSIS — N1831 Chronic kidney disease, stage 3a: Secondary | ICD-10-CM | POA: Diagnosis not present

## 2020-04-18 DIAGNOSIS — E1151 Type 2 diabetes mellitus with diabetic peripheral angiopathy without gangrene: Secondary | ICD-10-CM | POA: Diagnosis not present

## 2020-04-18 DIAGNOSIS — L89312 Pressure ulcer of right buttock, stage 2: Secondary | ICD-10-CM | POA: Diagnosis not present

## 2020-04-18 DIAGNOSIS — I129 Hypertensive chronic kidney disease with stage 1 through stage 4 chronic kidney disease, or unspecified chronic kidney disease: Secondary | ICD-10-CM | POA: Diagnosis not present

## 2020-04-18 DIAGNOSIS — J439 Emphysema, unspecified: Secondary | ICD-10-CM | POA: Diagnosis not present

## 2020-04-18 DIAGNOSIS — J9611 Chronic respiratory failure with hypoxia: Secondary | ICD-10-CM | POA: Diagnosis not present

## 2020-04-18 DIAGNOSIS — J9811 Atelectasis: Secondary | ICD-10-CM | POA: Diagnosis not present

## 2020-04-18 DIAGNOSIS — Z466 Encounter for fitting and adjustment of urinary device: Secondary | ICD-10-CM | POA: Diagnosis not present

## 2020-04-18 DIAGNOSIS — E1122 Type 2 diabetes mellitus with diabetic chronic kidney disease: Secondary | ICD-10-CM | POA: Diagnosis not present

## 2020-04-23 DIAGNOSIS — N1831 Chronic kidney disease, stage 3a: Secondary | ICD-10-CM | POA: Diagnosis not present

## 2020-04-23 DIAGNOSIS — E1122 Type 2 diabetes mellitus with diabetic chronic kidney disease: Secondary | ICD-10-CM | POA: Diagnosis not present

## 2020-04-23 DIAGNOSIS — L89312 Pressure ulcer of right buttock, stage 2: Secondary | ICD-10-CM | POA: Diagnosis not present

## 2020-04-23 DIAGNOSIS — E1151 Type 2 diabetes mellitus with diabetic peripheral angiopathy without gangrene: Secondary | ICD-10-CM | POA: Diagnosis not present

## 2020-04-23 DIAGNOSIS — J9611 Chronic respiratory failure with hypoxia: Secondary | ICD-10-CM | POA: Diagnosis not present

## 2020-04-23 DIAGNOSIS — Z466 Encounter for fitting and adjustment of urinary device: Secondary | ICD-10-CM | POA: Diagnosis not present

## 2020-04-23 DIAGNOSIS — J9811 Atelectasis: Secondary | ICD-10-CM | POA: Diagnosis not present

## 2020-04-23 DIAGNOSIS — I129 Hypertensive chronic kidney disease with stage 1 through stage 4 chronic kidney disease, or unspecified chronic kidney disease: Secondary | ICD-10-CM | POA: Diagnosis not present

## 2020-04-23 DIAGNOSIS — J439 Emphysema, unspecified: Secondary | ICD-10-CM | POA: Diagnosis not present

## 2020-04-25 DIAGNOSIS — J439 Emphysema, unspecified: Secondary | ICD-10-CM | POA: Diagnosis not present

## 2020-04-25 DIAGNOSIS — E1122 Type 2 diabetes mellitus with diabetic chronic kidney disease: Secondary | ICD-10-CM | POA: Diagnosis not present

## 2020-04-25 DIAGNOSIS — E1151 Type 2 diabetes mellitus with diabetic peripheral angiopathy without gangrene: Secondary | ICD-10-CM | POA: Diagnosis not present

## 2020-04-25 DIAGNOSIS — L89312 Pressure ulcer of right buttock, stage 2: Secondary | ICD-10-CM | POA: Diagnosis not present

## 2020-04-25 DIAGNOSIS — I129 Hypertensive chronic kidney disease with stage 1 through stage 4 chronic kidney disease, or unspecified chronic kidney disease: Secondary | ICD-10-CM | POA: Diagnosis not present

## 2020-04-25 DIAGNOSIS — J9611 Chronic respiratory failure with hypoxia: Secondary | ICD-10-CM | POA: Diagnosis not present

## 2020-04-25 DIAGNOSIS — J9811 Atelectasis: Secondary | ICD-10-CM | POA: Diagnosis not present

## 2020-04-25 DIAGNOSIS — Z466 Encounter for fitting and adjustment of urinary device: Secondary | ICD-10-CM | POA: Diagnosis not present

## 2020-04-25 DIAGNOSIS — N1831 Chronic kidney disease, stage 3a: Secondary | ICD-10-CM | POA: Diagnosis not present

## 2020-04-29 ENCOUNTER — Telehealth: Payer: Self-pay

## 2020-04-29 NOTE — Telephone Encounter (Signed)
Received message that daughter, [name], called to report BP is elevated today. [name] had reached out to PCP to report this. PCP requesting that Palliative NP make visit sooner than scheduled visit on 05/02/20

## 2020-04-30 ENCOUNTER — Other Ambulatory Visit: Payer: Medicare HMO | Admitting: Hospice

## 2020-04-30 DIAGNOSIS — E1122 Type 2 diabetes mellitus with diabetic chronic kidney disease: Secondary | ICD-10-CM | POA: Diagnosis not present

## 2020-04-30 DIAGNOSIS — J9611 Chronic respiratory failure with hypoxia: Secondary | ICD-10-CM | POA: Diagnosis not present

## 2020-04-30 DIAGNOSIS — I129 Hypertensive chronic kidney disease with stage 1 through stage 4 chronic kidney disease, or unspecified chronic kidney disease: Secondary | ICD-10-CM | POA: Diagnosis not present

## 2020-04-30 DIAGNOSIS — E1151 Type 2 diabetes mellitus with diabetic peripheral angiopathy without gangrene: Secondary | ICD-10-CM | POA: Diagnosis not present

## 2020-04-30 DIAGNOSIS — L89312 Pressure ulcer of right buttock, stage 2: Secondary | ICD-10-CM | POA: Diagnosis not present

## 2020-04-30 DIAGNOSIS — Z466 Encounter for fitting and adjustment of urinary device: Secondary | ICD-10-CM | POA: Diagnosis not present

## 2020-04-30 DIAGNOSIS — Z515 Encounter for palliative care: Secondary | ICD-10-CM

## 2020-04-30 DIAGNOSIS — N1831 Chronic kidney disease, stage 3a: Secondary | ICD-10-CM | POA: Diagnosis not present

## 2020-04-30 DIAGNOSIS — J9811 Atelectasis: Secondary | ICD-10-CM | POA: Diagnosis not present

## 2020-04-30 DIAGNOSIS — J439 Emphysema, unspecified: Secondary | ICD-10-CM | POA: Diagnosis not present

## 2020-04-30 NOTE — Progress Notes (Signed)
[facility] Consult Note Telephone: [phone]  Fax: [phone]  PATIENT NAME: [name] [name] DOB: [birth year] MRN: [id number]  PRIMARY CARE PROVIDER:   Prince Solian, MD  REFERRING PROVIDER: Prince Solian, MD  RESPONSIBLE PARTYLattie [name] [phone]. [name]'s brother [name] [name] [phone]    RECOMMENDATIONS/PLAN:    Advance Care Planning/Goals of Care: Visit at the request of Dr. Berneta Sages   for palliative consult. Visit consisted of building trust and discussions on Palliative Medicine as specialized medical care for people living with serious illness, aimed at facilitating better quality of life through symptoms relief, assisting with advance care plan and establishing goals of care.   After discussions on [name] [name], [name] [name] affirmed that patient is now a DO NOT RESUSCITATE.  NP signed a DNR form, and uploaded sent to epic today.  [name] [name] is receptive to further discussions on goals of care clarification using the MOST form in next visit.   Goals of care include to maximize quality of life and symptom management.   Visit also consisted of counseling and education dealing with the complex and emotionally intense issues of symptom management and palliative care in the setting of serious and potentially life-threatening illness.  Patient is a [place] Vet with [employer] and receiving support from the [place]. [name] [name] shared her plans to go to the beach for respite.  Therapeutic listening, validation, and ample emotional support provided.  Palliative care team will continue to support patient, patient's family, and medical team.   Follow up: Palliative care will continue to follow patient for goals of care clarification and symptom management.  Follow-up visit in a month. Symptom management: Worsening agitation/restlessness related to dementia.  Patient currently on Seroquel 50 mg twice daily daily lorazepam 0.5 mg twice daily as needed  restlessness/agitation. Daughter reports that this regimen is not working as patient continues to be very agitated especially during the day. She plans to contact PCP by phone on this.  Recommendations: Lorazepam 0.5 mg can be increased to every 6 hours as needed for agitation and an increase in his Seroquel from present 50mg  BID to 75mg  in the morning and 50mg  at night.  This recommendation also communicated via secure chat to PCP. [name] [name] explained that patient's blood pressure goes up when he is combative trying to get out of the bed/resists care.   Report received yesterday from [facility] that BP was 193 systolic; when rechecked it was 114/60. His normal blood pressure ranges in the 790W to 409B systolic.  Today's reading from nursing staff is within his normal range.  Encouraged close monitoring of blood pressure and to call with any concerns.  Patient is incontinent of bowel and uses condom cath through the night.  FAST 6D, FLACC 0.  He denies pain/discomfort, no headaches, no palpitations, no coughing, no respiratory distress. Patient continues on 3L/Min for COPD. Takes his breathing treatments as ordered.   Sacral pressure ulcer improving from stage 3 last month to currently a stage 2.  [facility] well continues to manage sacral wound.  Nursing aide comes 5 days a week to assist patient with activities of daily living.  [facility] delivering a special wheelchair with a design to help prevent pressure ulcer. No medical acuity.  No acute concerns today.  Encouraged ongoing nursing care. I spent 1 hour and 55 minutes minutes providing this initial consultation; time iincludes time spent with patient/family, chart review, provider coordination,  and documentation. More than 50% of the  time in this consultation was spent on coordinating communication  HISTORY OF PRESENT ILLNESS:  [name] [name] is a 79 y.o. year old male with multiple medical problems including COPD Dementia, Hx of prostate CA per [name] [name], hypertension.  Palliative  Care was asked to help address goals of care.   CODE STATUS: DNR  PPS: 30% HOSPICE ELIGIBILITY/DIAGNOSIS: TBD  PAST MEDICAL HISTORY:  Past Medical History:  Diagnosis Date  . Anemia   . Arthritis   . Cancer ([place])   . COPD (chronic obstructive pulmonary disease) ([place])   . Diabetes mellitus   . Hyperlipidemia   . Hypertension   . Prostate cancer [facility])     SOCIAL HX:  Social History   Tobacco Use  . Smoking status: Former Smoker    Quit date: 04/11/2005    Years since quitting: 15.0  . Smokeless tobacco: Never Used  Substance Use Topics  . Alcohol use: No    Comment: quit 8 weeks ago    ALLERGIES:  Allergies  Allergen Reactions  . Oxycodone Itching and Other (See Comments)    Only intolerant when takes with Diphenhydramine, altered mental status     PERTINENT MEDICATIONS:  Outpatient Encounter Medications as of 04/30/2020  Medication Sig  . albuterol (PROVENTIL) (2.5 MG/3ML) 0.083% nebulizer solution Take 3 mLs (2.5 mg total) by nebulization every 6 (six) hours.  Marland Kitchen aspirin 325 MG tablet Take 325 mg by mouth daily.  Marland Kitchen atorvastatin (LIPITOR) 10 MG tablet Take 10 mg by mouth daily.  . budesonide-formoterol (SYMBICORT) 160-4.5 MCG/ACT inhaler Inhale 2 puffs into the lungs 2 (two) times daily.  . cholecalciferol (VITAMIN D) 1000 UNITS tablet Take 1,000 Units by mouth daily.  . collagenase (SANTYL) ointment Apply 1 application topically daily. Apply to the affected area daily plus dry dressing  . cyanocobalamin 100 MCG tablet Take 1 tablet (100 mcg total) by mouth daily.  . divalproex (DEPAKOTE) 250 MG DR tablet Take 250 mg by mouth 2 (two) times daily.  Marland Kitchen doxycycline (VIBRA-TABS) 100 MG tablet Take 1 tablet (100 mg total) by mouth every 12 (twelve) hours.  . ferrous sulfate 325 (65 FE) MG tablet Take 1 tablet (325 mg total) by mouth daily with breakfast. Take with Calcium (Patient taking differently: Take 325 mg by mouth 2 (two) times daily with a meal. )  . finasteride  (PROSCAR) 5 MG tablet Take 5 mg by mouth daily.  Marland Kitchen FLUoxetine (PROZAC) 20 MG capsule Take 60 mg by mouth daily.   . fluticasone furoate-vilanterol (BREO ELLIPTA) 100-25 MCG/INH AEPB Inhale 1 puff into the lungs daily.  . furosemide (LASIX) 20 MG tablet Take 20 mg by mouth daily.  Marland Kitchen gabapentin (NEURONTIN) 300 MG capsule Take 300 mg by mouth at bedtime.  Marland Kitchen glipiZIDE (GLUCOTROL) 5 MG tablet Take 0.5 tablets (2.5 mg total) by mouth 2 (two) times daily before a meal.  . metFORMIN (GLUCOPHAGE) 1000 MG tablet Take 1,000 mg by mouth 2 (two) times daily with a meal.   . Multiple Vitamin (MULTIVITAMIN WITH MINERALS) TABS tablet Take 1 tablet by mouth daily.  . nicotine (NICODERM CQ - DOSED IN MG/24 HR) 7 mg/24hr patch Place 1 patch (7 mg total) onto the skin daily.  . predniSONE (DELTASONE) 20 MG tablet Take 1 tablet (20 mg total) by mouth daily with breakfast.  . senna-docusate (SENOKOT-S) 8.6-50 MG tablet Take 1 tablet by mouth daily as needed for mild constipation.  Marland Kitchen tiotropium (SPIRIVA) 18 MCG inhalation capsule Place 18 mcg into inhaler and  inhale daily.  . vitamin C (ASCORBIC ACID) 500 MG tablet Take 500 mg by mouth daily.   No facility-administered encounter medications on file as of 04/30/2020.    PHYSICAL EXAM/ROS:  General: NAD, cooperative Cardiovascular: regular rate and rhythm; denies chest pain Pulmonary: No adventitious lung sounds auscultated; on oxygen supplementation 3 L/min, normal respiratory effort Abdomen: soft, nontender, + bowel sounds GU: no suprapubic tenderness Extremities: no edema Skin: no rashes to exposed skin Neurological: Weakness but otherwise nonfocal  Teodoro Spray, NP

## 2020-05-02 ENCOUNTER — Telehealth: Payer: Self-pay | Admitting: Hospice

## 2020-05-02 ENCOUNTER — Other Ambulatory Visit: Payer: Medicare HMO | Admitting: Hospice

## 2020-05-02 ENCOUNTER — Telehealth: Payer: Self-pay

## 2020-05-02 ENCOUNTER — Other Ambulatory Visit: Payer: Self-pay

## 2020-05-02 DIAGNOSIS — Z515 Encounter for palliative care: Secondary | ICD-10-CM

## 2020-05-02 DIAGNOSIS — J9811 Atelectasis: Secondary | ICD-10-CM | POA: Diagnosis not present

## 2020-05-02 DIAGNOSIS — E1122 Type 2 diabetes mellitus with diabetic chronic kidney disease: Secondary | ICD-10-CM | POA: Diagnosis not present

## 2020-05-02 DIAGNOSIS — J9611 Chronic respiratory failure with hypoxia: Secondary | ICD-10-CM | POA: Diagnosis not present

## 2020-05-02 DIAGNOSIS — Z466 Encounter for fitting and adjustment of urinary device: Secondary | ICD-10-CM | POA: Diagnosis not present

## 2020-05-02 DIAGNOSIS — L89312 Pressure ulcer of right buttock, stage 2: Secondary | ICD-10-CM | POA: Diagnosis not present

## 2020-05-02 DIAGNOSIS — N1831 Chronic kidney disease, stage 3a: Secondary | ICD-10-CM | POA: Diagnosis not present

## 2020-05-02 DIAGNOSIS — E1151 Type 2 diabetes mellitus with diabetic peripheral angiopathy without gangrene: Secondary | ICD-10-CM | POA: Diagnosis not present

## 2020-05-02 DIAGNOSIS — J439 Emphysema, unspecified: Secondary | ICD-10-CM | POA: Diagnosis not present

## 2020-05-02 DIAGNOSIS — I129 Hypertensive chronic kidney disease with stage 1 through stage 4 chronic kidney disease, or unspecified chronic kidney disease: Secondary | ICD-10-CM | POA: Diagnosis not present

## 2020-05-02 NOTE — Telephone Encounter (Signed)
[name] [name] called NP reporting that PCP's office has not faxed in prescription for dosage increases recommended for Seroquel and Ativan due to patient's increasing agitation. She was informed that [facility] clinical navigator had reached PCP's office and also faxed in NP's recommendations. She requested NP should still call. NP called thereafter and left message with PCP's medical assistant with recommendation of Seroquel 75mg  in the morning and 50mg  at night; Lorazepam 0.5mg  every 6 hours as needed for restless/agitation.

## 2020-05-02 NOTE — Progress Notes (Signed)
This visit did not hold; visit was held 2 days ago 05/02/2020

## 2020-05-02 NOTE — Telephone Encounter (Signed)
Received message that patient's daughter had called requesting Dr. Danna Hefty office be updated again on NP's recommendations for med changes. Phone call placed to PCP office, left message for Dr. Danna Hefty medical assistant. Will fax NP note to PCP office.

## 2020-05-07 DIAGNOSIS — E1122 Type 2 diabetes mellitus with diabetic chronic kidney disease: Secondary | ICD-10-CM | POA: Diagnosis not present

## 2020-05-07 DIAGNOSIS — N1831 Chronic kidney disease, stage 3a: Secondary | ICD-10-CM | POA: Diagnosis not present

## 2020-05-07 DIAGNOSIS — E1151 Type 2 diabetes mellitus with diabetic peripheral angiopathy without gangrene: Secondary | ICD-10-CM | POA: Diagnosis not present

## 2020-05-07 DIAGNOSIS — Z466 Encounter for fitting and adjustment of urinary device: Secondary | ICD-10-CM | POA: Diagnosis not present

## 2020-05-07 DIAGNOSIS — L89312 Pressure ulcer of right buttock, stage 2: Secondary | ICD-10-CM | POA: Diagnosis not present

## 2020-05-07 DIAGNOSIS — J9811 Atelectasis: Secondary | ICD-10-CM | POA: Diagnosis not present

## 2020-05-07 DIAGNOSIS — J9611 Chronic respiratory failure with hypoxia: Secondary | ICD-10-CM | POA: Diagnosis not present

## 2020-05-07 DIAGNOSIS — I129 Hypertensive chronic kidney disease with stage 1 through stage 4 chronic kidney disease, or unspecified chronic kidney disease: Secondary | ICD-10-CM | POA: Diagnosis not present

## 2020-05-07 DIAGNOSIS — J439 Emphysema, unspecified: Secondary | ICD-10-CM | POA: Diagnosis not present

## 2020-05-09 DIAGNOSIS — J9611 Chronic respiratory failure with hypoxia: Secondary | ICD-10-CM | POA: Diagnosis not present

## 2020-05-09 DIAGNOSIS — E1122 Type 2 diabetes mellitus with diabetic chronic kidney disease: Secondary | ICD-10-CM | POA: Diagnosis not present

## 2020-05-09 DIAGNOSIS — E1151 Type 2 diabetes mellitus with diabetic peripheral angiopathy without gangrene: Secondary | ICD-10-CM | POA: Diagnosis not present

## 2020-05-09 DIAGNOSIS — J9811 Atelectasis: Secondary | ICD-10-CM | POA: Diagnosis not present

## 2020-05-09 DIAGNOSIS — N1831 Chronic kidney disease, stage 3a: Secondary | ICD-10-CM | POA: Diagnosis not present

## 2020-05-09 DIAGNOSIS — I129 Hypertensive chronic kidney disease with stage 1 through stage 4 chronic kidney disease, or unspecified chronic kidney disease: Secondary | ICD-10-CM | POA: Diagnosis not present

## 2020-05-09 DIAGNOSIS — L89312 Pressure ulcer of right buttock, stage 2: Secondary | ICD-10-CM | POA: Diagnosis not present

## 2020-05-09 DIAGNOSIS — Z466 Encounter for fitting and adjustment of urinary device: Secondary | ICD-10-CM | POA: Diagnosis not present

## 2020-05-09 DIAGNOSIS — J439 Emphysema, unspecified: Secondary | ICD-10-CM | POA: Diagnosis not present

## 2020-05-10 DIAGNOSIS — N1831 Chronic kidney disease, stage 3a: Secondary | ICD-10-CM | POA: Diagnosis not present

## 2020-05-10 DIAGNOSIS — J449 Chronic obstructive pulmonary disease, unspecified: Secondary | ICD-10-CM | POA: Diagnosis not present

## 2020-05-10 DIAGNOSIS — F0390 Unspecified dementia without behavioral disturbance: Secondary | ICD-10-CM | POA: Diagnosis not present

## 2020-05-10 DIAGNOSIS — F39 Unspecified mood [affective] disorder: Secondary | ICD-10-CM | POA: Diagnosis not present

## 2020-05-10 DIAGNOSIS — B369 Superficial mycosis, unspecified: Secondary | ICD-10-CM | POA: Diagnosis not present

## 2020-05-10 DIAGNOSIS — I872 Venous insufficiency (chronic) (peripheral): Secondary | ICD-10-CM | POA: Diagnosis not present

## 2020-05-10 DIAGNOSIS — I129 Hypertensive chronic kidney disease with stage 1 through stage 4 chronic kidney disease, or unspecified chronic kidney disease: Secondary | ICD-10-CM | POA: Diagnosis not present

## 2020-05-14 DIAGNOSIS — E1151 Type 2 diabetes mellitus with diabetic peripheral angiopathy without gangrene: Secondary | ICD-10-CM | POA: Diagnosis not present

## 2020-05-14 DIAGNOSIS — N1831 Chronic kidney disease, stage 3a: Secondary | ICD-10-CM | POA: Diagnosis not present

## 2020-05-14 DIAGNOSIS — L89312 Pressure ulcer of right buttock, stage 2: Secondary | ICD-10-CM | POA: Diagnosis not present

## 2020-05-14 DIAGNOSIS — I10 Essential (primary) hypertension: Secondary | ICD-10-CM | POA: Diagnosis not present

## 2020-05-14 DIAGNOSIS — D649 Anemia, unspecified: Secondary | ICD-10-CM | POA: Diagnosis not present

## 2020-05-14 DIAGNOSIS — E119 Type 2 diabetes mellitus without complications: Secondary | ICD-10-CM | POA: Diagnosis not present

## 2020-05-14 DIAGNOSIS — J439 Emphysema, unspecified: Secondary | ICD-10-CM | POA: Diagnosis not present

## 2020-05-14 DIAGNOSIS — J9811 Atelectasis: Secondary | ICD-10-CM | POA: Diagnosis not present

## 2020-05-14 DIAGNOSIS — J9611 Chronic respiratory failure with hypoxia: Secondary | ICD-10-CM | POA: Diagnosis not present

## 2020-05-14 DIAGNOSIS — E1122 Type 2 diabetes mellitus with diabetic chronic kidney disease: Secondary | ICD-10-CM | POA: Diagnosis not present

## 2020-05-14 DIAGNOSIS — J449 Chronic obstructive pulmonary disease, unspecified: Secondary | ICD-10-CM | POA: Diagnosis not present

## 2020-05-14 DIAGNOSIS — Z466 Encounter for fitting and adjustment of urinary device: Secondary | ICD-10-CM | POA: Diagnosis not present

## 2020-05-14 DIAGNOSIS — I129 Hypertensive chronic kidney disease with stage 1 through stage 4 chronic kidney disease, or unspecified chronic kidney disease: Secondary | ICD-10-CM | POA: Diagnosis not present

## 2020-05-16 DIAGNOSIS — N1831 Chronic kidney disease, stage 3a: Secondary | ICD-10-CM | POA: Diagnosis not present

## 2020-05-16 DIAGNOSIS — J9811 Atelectasis: Secondary | ICD-10-CM | POA: Diagnosis not present

## 2020-05-16 DIAGNOSIS — J439 Emphysema, unspecified: Secondary | ICD-10-CM | POA: Diagnosis not present

## 2020-05-16 DIAGNOSIS — J9611 Chronic respiratory failure with hypoxia: Secondary | ICD-10-CM | POA: Diagnosis not present

## 2020-05-16 DIAGNOSIS — L89312 Pressure ulcer of right buttock, stage 2: Secondary | ICD-10-CM | POA: Diagnosis not present

## 2020-05-16 DIAGNOSIS — E1151 Type 2 diabetes mellitus with diabetic peripheral angiopathy without gangrene: Secondary | ICD-10-CM | POA: Diagnosis not present

## 2020-05-16 DIAGNOSIS — R159 Full incontinence of feces: Secondary | ICD-10-CM | POA: Diagnosis not present

## 2020-05-16 DIAGNOSIS — E1122 Type 2 diabetes mellitus with diabetic chronic kidney disease: Secondary | ICD-10-CM | POA: Diagnosis not present

## 2020-05-16 DIAGNOSIS — Z466 Encounter for fitting and adjustment of urinary device: Secondary | ICD-10-CM | POA: Diagnosis not present

## 2020-05-16 DIAGNOSIS — I129 Hypertensive chronic kidney disease with stage 1 through stage 4 chronic kidney disease, or unspecified chronic kidney disease: Secondary | ICD-10-CM | POA: Diagnosis not present

## 2020-05-20 DIAGNOSIS — S199XXA Unspecified injury of neck, initial encounter: Secondary | ICD-10-CM | POA: Diagnosis not present

## 2020-05-20 DIAGNOSIS — R52 Pain, unspecified: Secondary | ICD-10-CM | POA: Diagnosis not present

## 2020-05-20 DIAGNOSIS — S3992XA Unspecified injury of lower back, initial encounter: Secondary | ICD-10-CM | POA: Diagnosis not present

## 2020-05-20 DIAGNOSIS — Z7401 Bed confinement status: Secondary | ICD-10-CM | POA: Diagnosis not present

## 2020-05-20 DIAGNOSIS — F0390 Unspecified dementia without behavioral disturbance: Secondary | ICD-10-CM | POA: Diagnosis not present

## 2020-05-20 DIAGNOSIS — Y998 Other external cause status: Secondary | ICD-10-CM | POA: Diagnosis not present

## 2020-05-20 DIAGNOSIS — M25551 Pain in right hip: Secondary | ICD-10-CM | POA: Diagnosis not present

## 2020-05-20 DIAGNOSIS — R0902 Hypoxemia: Secondary | ICD-10-CM | POA: Diagnosis not present

## 2020-05-20 DIAGNOSIS — S0990XA Unspecified injury of head, initial encounter: Secondary | ICD-10-CM | POA: Diagnosis not present

## 2020-05-20 DIAGNOSIS — W06XXXA Fall from bed, initial encounter: Secondary | ICD-10-CM | POA: Diagnosis not present

## 2020-05-20 DIAGNOSIS — M2550 Pain in unspecified joint: Secondary | ICD-10-CM | POA: Diagnosis not present

## 2020-05-20 DIAGNOSIS — S299XXA Unspecified injury of thorax, initial encounter: Secondary | ICD-10-CM | POA: Diagnosis not present

## 2020-05-20 DIAGNOSIS — M5489 Other dorsalgia: Secondary | ICD-10-CM | POA: Diagnosis not present

## 2020-05-20 DIAGNOSIS — S79911A Unspecified injury of right hip, initial encounter: Secondary | ICD-10-CM | POA: Diagnosis not present

## 2020-05-20 DIAGNOSIS — I700 Atherosclerosis of aorta: Secondary | ICD-10-CM | POA: Diagnosis not present

## 2020-05-20 DIAGNOSIS — W19XXXA Unspecified fall, initial encounter: Secondary | ICD-10-CM | POA: Diagnosis not present

## 2020-05-21 DIAGNOSIS — E1151 Type 2 diabetes mellitus with diabetic peripheral angiopathy without gangrene: Secondary | ICD-10-CM | POA: Diagnosis not present

## 2020-05-21 DIAGNOSIS — N1831 Chronic kidney disease, stage 3a: Secondary | ICD-10-CM | POA: Diagnosis not present

## 2020-05-21 DIAGNOSIS — J9811 Atelectasis: Secondary | ICD-10-CM | POA: Diagnosis not present

## 2020-05-21 DIAGNOSIS — L89312 Pressure ulcer of right buttock, stage 2: Secondary | ICD-10-CM | POA: Diagnosis not present

## 2020-05-21 DIAGNOSIS — E1122 Type 2 diabetes mellitus with diabetic chronic kidney disease: Secondary | ICD-10-CM | POA: Diagnosis not present

## 2020-05-21 DIAGNOSIS — Z466 Encounter for fitting and adjustment of urinary device: Secondary | ICD-10-CM | POA: Diagnosis not present

## 2020-05-21 DIAGNOSIS — J439 Emphysema, unspecified: Secondary | ICD-10-CM | POA: Diagnosis not present

## 2020-05-21 DIAGNOSIS — I129 Hypertensive chronic kidney disease with stage 1 through stage 4 chronic kidney disease, or unspecified chronic kidney disease: Secondary | ICD-10-CM | POA: Diagnosis not present

## 2020-05-21 DIAGNOSIS — J9611 Chronic respiratory failure with hypoxia: Secondary | ICD-10-CM | POA: Diagnosis not present

## 2020-05-23 DIAGNOSIS — J9611 Chronic respiratory failure with hypoxia: Secondary | ICD-10-CM | POA: Diagnosis not present

## 2020-05-23 DIAGNOSIS — Z466 Encounter for fitting and adjustment of urinary device: Secondary | ICD-10-CM | POA: Diagnosis not present

## 2020-05-23 DIAGNOSIS — J9811 Atelectasis: Secondary | ICD-10-CM | POA: Diagnosis not present

## 2020-05-23 DIAGNOSIS — E1151 Type 2 diabetes mellitus with diabetic peripheral angiopathy without gangrene: Secondary | ICD-10-CM | POA: Diagnosis not present

## 2020-05-23 DIAGNOSIS — L89312 Pressure ulcer of right buttock, stage 2: Secondary | ICD-10-CM | POA: Diagnosis not present

## 2020-05-23 DIAGNOSIS — E1122 Type 2 diabetes mellitus with diabetic chronic kidney disease: Secondary | ICD-10-CM | POA: Diagnosis not present

## 2020-05-23 DIAGNOSIS — J439 Emphysema, unspecified: Secondary | ICD-10-CM | POA: Diagnosis not present

## 2020-05-23 DIAGNOSIS — N1831 Chronic kidney disease, stage 3a: Secondary | ICD-10-CM | POA: Diagnosis not present

## 2020-05-23 DIAGNOSIS — I129 Hypertensive chronic kidney disease with stage 1 through stage 4 chronic kidney disease, or unspecified chronic kidney disease: Secondary | ICD-10-CM | POA: Diagnosis not present

## 2020-05-27 ENCOUNTER — Telehealth: Payer: Self-pay

## 2020-05-27 NOTE — Telephone Encounter (Signed)
Message left requesting a return call from PCP's nurse.

## 2020-05-27 NOTE — Telephone Encounter (Signed)
Received a message to call patient's daughter, [name], regarding medication confusion. Spoke with daughter who relayed that Ativan had been increased but directions on bottle were still for only once a day. [name] felt like there was a miscommunication regarding this medicine and that is why patient required a refill early. Will reach out to Palliative NP and PCP office.

## 2020-05-28 ENCOUNTER — Telehealth: Payer: Self-pay

## 2020-05-28 DIAGNOSIS — J9611 Chronic respiratory failure with hypoxia: Secondary | ICD-10-CM | POA: Diagnosis not present

## 2020-05-28 DIAGNOSIS — I129 Hypertensive chronic kidney disease with stage 1 through stage 4 chronic kidney disease, or unspecified chronic kidney disease: Secondary | ICD-10-CM | POA: Diagnosis not present

## 2020-05-28 DIAGNOSIS — N1831 Chronic kidney disease, stage 3a: Secondary | ICD-10-CM | POA: Diagnosis not present

## 2020-05-28 DIAGNOSIS — E1122 Type 2 diabetes mellitus with diabetic chronic kidney disease: Secondary | ICD-10-CM | POA: Diagnosis not present

## 2020-05-28 DIAGNOSIS — L89312 Pressure ulcer of right buttock, stage 2: Secondary | ICD-10-CM | POA: Diagnosis not present

## 2020-05-28 DIAGNOSIS — J9811 Atelectasis: Secondary | ICD-10-CM | POA: Diagnosis not present

## 2020-05-28 DIAGNOSIS — Z466 Encounter for fitting and adjustment of urinary device: Secondary | ICD-10-CM | POA: Diagnosis not present

## 2020-05-28 DIAGNOSIS — J439 Emphysema, unspecified: Secondary | ICD-10-CM | POA: Diagnosis not present

## 2020-05-28 DIAGNOSIS — E1151 Type 2 diabetes mellitus with diabetic peripheral angiopathy without gangrene: Secondary | ICD-10-CM | POA: Diagnosis not present

## 2020-05-28 NOTE — Telephone Encounter (Signed)
Received TC from Dr. Danna Hefty nurse requesting Palliative NP to make a visit to assess patient without being medicated with ativan. Per nurse,Kim, Dr.Avva directed family to stop ativan at this time. Palliative NP updated.

## 2020-05-28 NOTE — Telephone Encounter (Signed)
Phone call placed to daughter, [name]. VM left providing update on direction from Dr. Dagmar Hait to schedule visit for evaluation of patient without ativan. Scheduled visit for Thursday 05/30/20 @ 2:30pm per Windy Canny NP

## 2020-05-30 ENCOUNTER — Other Ambulatory Visit: Payer: Medicare HMO | Admitting: Hospice

## 2020-05-30 ENCOUNTER — Other Ambulatory Visit: Payer: Self-pay

## 2020-05-30 DIAGNOSIS — J9811 Atelectasis: Secondary | ICD-10-CM | POA: Diagnosis not present

## 2020-05-30 DIAGNOSIS — Z515 Encounter for palliative care: Secondary | ICD-10-CM | POA: Diagnosis not present

## 2020-05-30 DIAGNOSIS — J439 Emphysema, unspecified: Secondary | ICD-10-CM | POA: Diagnosis not present

## 2020-05-30 DIAGNOSIS — I129 Hypertensive chronic kidney disease with stage 1 through stage 4 chronic kidney disease, or unspecified chronic kidney disease: Secondary | ICD-10-CM | POA: Diagnosis not present

## 2020-05-30 DIAGNOSIS — E1122 Type 2 diabetes mellitus with diabetic chronic kidney disease: Secondary | ICD-10-CM | POA: Diagnosis not present

## 2020-05-30 DIAGNOSIS — Z466 Encounter for fitting and adjustment of urinary device: Secondary | ICD-10-CM | POA: Diagnosis not present

## 2020-05-30 DIAGNOSIS — E1151 Type 2 diabetes mellitus with diabetic peripheral angiopathy without gangrene: Secondary | ICD-10-CM | POA: Diagnosis not present

## 2020-05-30 DIAGNOSIS — N1831 Chronic kidney disease, stage 3a: Secondary | ICD-10-CM | POA: Diagnosis not present

## 2020-05-30 DIAGNOSIS — J9611 Chronic respiratory failure with hypoxia: Secondary | ICD-10-CM | POA: Diagnosis not present

## 2020-05-30 DIAGNOSIS — L89312 Pressure ulcer of right buttock, stage 2: Secondary | ICD-10-CM | POA: Diagnosis not present

## 2020-05-30 NOTE — Progress Notes (Signed)
[facility] Consult Note Telephone: [phone]  Fax: [phone]  PATIENT NAME: [name] [name] DOB: 04-29-1941 MRN: [id number] PRIMARY CARE PROVIDER:   Prince Solian, MD  REFERRING PROVIDER: Prince Solian, MD  RESPONSIBLE PARTYLattie [name] [phone]. [name]'s brother [name] [name] [phone]    RECOMMENDATIONS/PLAN:    Advance Care Planning/Goals of Care: Visit at the request of Dr. Berneta Sages for palliative consult, to determine patient's status without the use of Lorazapam. CODE STATUS: [name] [name] affirmed that patient is a DO NOT RESUSCITATE;  signed  DNR form at home; same document uploaded in [facility].  [name] [name] is receptive to further discussions on goals of care clarification using the MOST form in next visit.    Patient is a [place] Vet with [employer] and receiving support from the [place]. [name] [name] shared her plans to go to the beach for respite. Palliative care team will continue to support patient, patient's family, and medical team.   Follow up: Palliative care will continue to follow patient for goals of care clarification and symptom management.  Follow-up visit in 2 months. Symptom management: Patient on Macrobid for UTI. Last dose scheduled for tomorrow morning. Patient was calm cooperative during visit; no agitation/restlessness, FLACC 0.  Lorazapam on hold for days, with good result as patient does not exhibit anxiety/agitation/restless since he stopped taking Lorazapam.  [name] [name] said family decided to stop patient from taking Seroquel; instead started him on Deparkote 250mg  BID. Patient was previously on Deparkote and had supply at home. She reports this is effective in managing patient's behavior; she would relate same information to PCP.   [name] [name] reported patient slid down to the floor from his bed on low position x on 8/16, 8/21, 8/23. 8/24; Paramedic called on each time - no injuries. Patient has been calm, not trying to get out of bed  since last 2-3 days. [name] [name] believes it is the change to Deparkote that is helping patient. Fall precautions in place. BP remains stable. Patient denies pain/discomfort, in no medical acuity. [facility] nurse continues to come twice a week for sacral /right heel wound dressing changes.   Patient is incontinent of bowel and uses condom cath through the night.  FAST 6D, FLACC 0.  He denies  no headaches, no palpitations, no coughing, no respiratory distress. Patient continues on 3L/Min for COPD. Takes his breathing treatments as ordered.   I spent 55 minutes minutes providing this  consultation; time iincludes time spent with patient/family, chart review, provider coordination,  and documentation. More than 50% of the time in this consultation was spent on coordinating communication  HISTORY OF PRESENT ILLNESS:  [name] [name] is a 79 y.o. year old male with multiple medical problems including COPD Dementia, Hx of prostate CA per [name] [name], hypertension.  Palliative Care was asked to help address goals of care.   CODE STATUS: DNR  PPS: 30% HOSPICE ELIGIBILITY/DIAGNOSIS: TBD  PAST MEDICAL HISTORY:  Past Medical History:  Diagnosis Date  . Anemia   . Arthritis   . Cancer ([place])   . COPD (chronic obstructive pulmonary disease) ([place])   . Diabetes mellitus   . Hyperlipidemia   . Hypertension   . Prostate cancer [facility])     SOCIAL HX:  Social History   Tobacco Use  . Smoking status: Former Smoker    Quit date: 04/11/2005    Years since quitting: 15.1  . Smokeless tobacco: Never Used  Substance Use Topics  . Alcohol use:  No    Comment: quit 8 weeks ago    ALLERGIES:  Allergies  Allergen Reactions  . Oxycodone Itching and Other (See Comments)    Only intolerant when takes with Diphenhydramine, altered mental status     PERTINENT MEDICATIONS:  Outpatient Encounter Medications as of 05/30/2020  Medication Sig  . albuterol (PROVENTIL) (2.5 MG/3ML) 0.083% nebulizer solution Take 3 mLs (2.5 mg  total) by nebulization every 6 (six) hours.  Marland Kitchen aspirin 325 MG tablet Take 325 mg by mouth daily.  Marland Kitchen atorvastatin (LIPITOR) 10 MG tablet Take 10 mg by mouth daily.  . budesonide-formoterol (SYMBICORT) 160-4.5 MCG/ACT inhaler Inhale 2 puffs into the lungs 2 (two) times daily.  . cholecalciferol (VITAMIN D) 1000 UNITS tablet Take 1,000 Units by mouth daily.  . collagenase (SANTYL) ointment Apply 1 application topically daily. Apply to the affected area daily plus dry dressing  . cyanocobalamin 100 MCG tablet Take 1 tablet (100 mcg total) by mouth daily.  . divalproex (DEPAKOTE) 250 MG DR tablet Take 250 mg by mouth 2 (two) times daily.  Marland Kitchen doxycycline (VIBRA-TABS) 100 MG tablet Take 1 tablet (100 mg total) by mouth every 12 (twelve) hours.  . ferrous sulfate 325 (65 FE) MG tablet Take 1 tablet (325 mg total) by mouth daily with breakfast. Take with Calcium (Patient taking differently: Take 325 mg by mouth 2 (two) times daily with a meal. )  . finasteride (PROSCAR) 5 MG tablet Take 5 mg by mouth daily.  Marland Kitchen FLUoxetine (PROZAC) 20 MG capsule Take 60 mg by mouth daily.   . fluticasone furoate-vilanterol (BREO ELLIPTA) 100-25 MCG/INH AEPB Inhale 1 puff into the lungs daily.  . furosemide (LASIX) 20 MG tablet Take 20 mg by mouth daily.  Marland Kitchen gabapentin (NEURONTIN) 300 MG capsule Take 300 mg by mouth at bedtime.  Marland Kitchen glipiZIDE (GLUCOTROL) 5 MG tablet Take 0.5 tablets (2.5 mg total) by mouth 2 (two) times daily before a meal.  . metFORMIN (GLUCOPHAGE) 1000 MG tablet Take 1,000 mg by mouth 2 (two) times daily with a meal.   . Multiple Vitamin (MULTIVITAMIN WITH MINERALS) TABS tablet Take 1 tablet by mouth daily.  . nicotine (NICODERM CQ - DOSED IN MG/24 HR) 7 mg/24hr patch Place 1 patch (7 mg total) onto the skin daily.  . predniSONE (DELTASONE) 20 MG tablet Take 1 tablet (20 mg total) by mouth daily with breakfast.  . senna-docusate (SENOKOT-S) 8.6-50 MG tablet Take 1 tablet by mouth daily as needed for mild  constipation.  Marland Kitchen tiotropium (SPIRIVA) 18 MCG inhalation capsule Place 18 mcg into inhaler and inhale daily.  . vitamin C (ASCORBIC ACID) 500 MG tablet Take 500 mg by mouth daily.   No facility-administered encounter medications on file as of 05/30/2020.    PHYSICAL EXAM/ROS:  General: NAD, cooperative Cardiovascular: regular rate and rhythm; denies chest pain Pulmonary: No adventitious lung sounds auscultated; on oxygen supplementation 3 L/min, normal respiratory effort Abdomen: soft, nontender, + bowel sounds GU: no suprapubic tenderness Extremities: no edema Skin: pressure wound to right heel and sacrum; followed by [facility]. no rashes to exposed skin Neurological: Weakness but otherwise nonfocal  Teodoro Spray, NP

## 2020-06-04 DIAGNOSIS — J9611 Chronic respiratory failure with hypoxia: Secondary | ICD-10-CM | POA: Diagnosis not present

## 2020-06-04 DIAGNOSIS — Z466 Encounter for fitting and adjustment of urinary device: Secondary | ICD-10-CM | POA: Diagnosis not present

## 2020-06-04 DIAGNOSIS — J439 Emphysema, unspecified: Secondary | ICD-10-CM | POA: Diagnosis not present

## 2020-06-04 DIAGNOSIS — I129 Hypertensive chronic kidney disease with stage 1 through stage 4 chronic kidney disease, or unspecified chronic kidney disease: Secondary | ICD-10-CM | POA: Diagnosis not present

## 2020-06-04 DIAGNOSIS — J9811 Atelectasis: Secondary | ICD-10-CM | POA: Diagnosis not present

## 2020-06-04 DIAGNOSIS — L89312 Pressure ulcer of right buttock, stage 2: Secondary | ICD-10-CM | POA: Diagnosis not present

## 2020-06-04 DIAGNOSIS — E1122 Type 2 diabetes mellitus with diabetic chronic kidney disease: Secondary | ICD-10-CM | POA: Diagnosis not present

## 2020-06-04 DIAGNOSIS — N1831 Chronic kidney disease, stage 3a: Secondary | ICD-10-CM | POA: Diagnosis not present

## 2020-06-04 DIAGNOSIS — E1151 Type 2 diabetes mellitus with diabetic peripheral angiopathy without gangrene: Secondary | ICD-10-CM | POA: Diagnosis not present

## 2020-06-06 DIAGNOSIS — E1122 Type 2 diabetes mellitus with diabetic chronic kidney disease: Secondary | ICD-10-CM | POA: Diagnosis not present

## 2020-06-06 DIAGNOSIS — Z466 Encounter for fitting and adjustment of urinary device: Secondary | ICD-10-CM | POA: Diagnosis not present

## 2020-06-06 DIAGNOSIS — I129 Hypertensive chronic kidney disease with stage 1 through stage 4 chronic kidney disease, or unspecified chronic kidney disease: Secondary | ICD-10-CM | POA: Diagnosis not present

## 2020-06-06 DIAGNOSIS — J9811 Atelectasis: Secondary | ICD-10-CM | POA: Diagnosis not present

## 2020-06-06 DIAGNOSIS — J9611 Chronic respiratory failure with hypoxia: Secondary | ICD-10-CM | POA: Diagnosis not present

## 2020-06-06 DIAGNOSIS — N1831 Chronic kidney disease, stage 3a: Secondary | ICD-10-CM | POA: Diagnosis not present

## 2020-06-06 DIAGNOSIS — L89312 Pressure ulcer of right buttock, stage 2: Secondary | ICD-10-CM | POA: Diagnosis not present

## 2020-06-06 DIAGNOSIS — J439 Emphysema, unspecified: Secondary | ICD-10-CM | POA: Diagnosis not present

## 2020-06-06 DIAGNOSIS — E1151 Type 2 diabetes mellitus with diabetic peripheral angiopathy without gangrene: Secondary | ICD-10-CM | POA: Diagnosis not present

## 2020-06-26 DIAGNOSIS — R05 Cough: Secondary | ICD-10-CM | POA: Diagnosis not present

## 2020-06-26 DIAGNOSIS — M2550 Pain in unspecified joint: Secondary | ICD-10-CM | POA: Diagnosis not present

## 2020-06-26 DIAGNOSIS — R5381 Other malaise: Secondary | ICD-10-CM | POA: Diagnosis not present

## 2020-06-26 DIAGNOSIS — J90 Pleural effusion, not elsewhere classified: Secondary | ICD-10-CM | POA: Diagnosis not present

## 2020-06-26 DIAGNOSIS — F0390 Unspecified dementia without behavioral disturbance: Secondary | ICD-10-CM | POA: Diagnosis not present

## 2020-06-26 DIAGNOSIS — Z209 Contact with and (suspected) exposure to unspecified communicable disease: Secondary | ICD-10-CM | POA: Diagnosis not present

## 2020-06-26 DIAGNOSIS — J9811 Atelectasis: Secondary | ICD-10-CM | POA: Diagnosis not present

## 2020-06-26 DIAGNOSIS — R531 Weakness: Secondary | ICD-10-CM | POA: Diagnosis not present

## 2020-06-26 DIAGNOSIS — Z87891 Personal history of nicotine dependence: Secondary | ICD-10-CM | POA: Diagnosis not present

## 2020-06-26 DIAGNOSIS — R062 Wheezing: Secondary | ICD-10-CM | POA: Diagnosis not present

## 2020-06-26 DIAGNOSIS — I517 Cardiomegaly: Secondary | ICD-10-CM | POA: Diagnosis not present

## 2020-06-26 DIAGNOSIS — R9431 Abnormal electrocardiogram [ECG] [EKG]: Secondary | ICD-10-CM | POA: Diagnosis not present

## 2020-06-26 DIAGNOSIS — L8991 Pressure ulcer of unspecified site, stage 1: Secondary | ICD-10-CM | POA: Diagnosis not present

## 2020-06-26 DIAGNOSIS — Z20822 Contact with and (suspected) exposure to covid-19: Secondary | ICD-10-CM | POA: Diagnosis not present

## 2020-06-26 DIAGNOSIS — Z7401 Bed confinement status: Secondary | ICD-10-CM | POA: Diagnosis not present

## 2020-07-15 ENCOUNTER — Other Ambulatory Visit: Payer: Self-pay

## 2020-07-15 ENCOUNTER — Other Ambulatory Visit: Payer: Medicare HMO | Admitting: Hospice

## 2020-07-15 DIAGNOSIS — F0390 Unspecified dementia without behavioral disturbance: Secondary | ICD-10-CM | POA: Diagnosis not present

## 2020-07-15 DIAGNOSIS — N1831 Chronic kidney disease, stage 3a: Secondary | ICD-10-CM | POA: Diagnosis not present

## 2020-07-15 DIAGNOSIS — Z515 Encounter for palliative care: Secondary | ICD-10-CM

## 2020-07-15 DIAGNOSIS — F39 Unspecified mood [affective] disorder: Secondary | ICD-10-CM | POA: Diagnosis not present

## 2020-07-15 DIAGNOSIS — I129 Hypertensive chronic kidney disease with stage 1 through stage 4 chronic kidney disease, or unspecified chronic kidney disease: Secondary | ICD-10-CM | POA: Diagnosis not present

## 2020-07-15 DIAGNOSIS — J449 Chronic obstructive pulmonary disease, unspecified: Secondary | ICD-10-CM | POA: Diagnosis not present

## 2020-07-15 DIAGNOSIS — F0391 Unspecified dementia with behavioral disturbance: Secondary | ICD-10-CM

## 2020-07-15 DIAGNOSIS — I872 Venous insufficiency (chronic) (peripheral): Secondary | ICD-10-CM | POA: Diagnosis not present

## 2020-07-15 DIAGNOSIS — B369 Superficial mycosis, unspecified: Secondary | ICD-10-CM | POA: Diagnosis not present

## 2020-07-15 NOTE — Progress Notes (Signed)
[facility] Consult Note Telephone: [phone]  Fax: [phone]  PATIENT NAME: [name] [name] DOB: 1941/09/11 MRN: [id number]  PRIMARY CARE PROVIDER:   Prince Solian, MD Prince Solian, MD [address] [place],  [place] [address]  REFERRING PROVIDER: Prince Solian, MD Prince Solian, [place],  [place] [address]  [place]. [name]'s brother Keshan Reha Jnr [phone]  TELEHEALTH VISIT STATEMENT Due to the COVID-19 crisis, this visit was done via telephone from my office. It was initiated and consented to by this patient and/or family.     RECOMMENDATIONS/PLAN:  Advance Care Planning.  On arrival at home, visit changed to telehealth following request from [facility].  Visit consisted of building trust and discussions on Palliative Medicine as specialized medical care for people living with serious illness, aimed at facilitating better quality of life through symptoms relief, assisting with advance care plan and establishing goals of care.    CODE STATUS:Patient is a DO NOT RESUSCITATE.  Signed DNR form at home with patient and uploaded in epic.   Goals of care: Goals of care is to maximize quality of life and symptom relief. [name] is receptive to further discussions on goals of care clarification using the MOST form in the future.  Palliative care team will continue to support patient, patient's family, and medical team.   Follow GU:[id number] care will continue to follow patient for goals of care clarification and symptom management.Follow-up visit in 3 months.  Symptom management:[name] reported that last month patient was exposed to someone with COVID-19 and that he was sent out to the ED; patient was negative for COVID-19, negative for UTI, and negative for pneumonia.   [name] reports that patient continues to try to climb out of bed and that she wanted to restart  patient on Ativan because she had some tablets at home.  This is against medical advise from PCP.  NP discussed possibility of seeking psych consult/Neurologist consult for patient.  [name] would call PCP for possible referral if behavior continues.  She said she is continuing to give patient Depakote and this is helpful.  [name] had previously decided to give patient Depakote instead of Seroquel as  ordered by PCP because she reported Seroquel was making patient "go crazy".  [name] had reported she had related same information to PCP.  Patient was calm and cooperative when talking with NP; he had no complaints/concerns. [name] reported that left heel wound and sacral wound has greatly improved; [facility] is no longer following patient for wound.  NP encouraged position changes to help relieve pressure and prevent skin breakdown.  Patient is incontinent of bowel and uses condom cath through the night.FAST 6D, FLACC 0. He denies  no headaches, no palpitations, no coughing, no respiratory distress. Patient continues on3L/Minfor COPD. Takes his breathing treatmentsas ordered.  Previous visit 05/30/2020: [name] reported patient slid down to the floor from his bed on low position x on 8/16, 8/21, 8/23. 8/24; Paramedic called on each time - no injuries. Patient has been calm, not trying to get out of bed since last 2-3 days. [name] believes it is the change to Deparkote that is helping patient. Fall precautions in place. BP remains stable. Patient denies pain/discomfort, in no medical acuity.   Family/caregiver/community supports: Patient lives at home with [name] who is involved in his care.  Other family members also come to help out.Patient is a [place] [employer] corpsand receiving support from the [place].  I  spent30 minutesminutes providing this consultation; time iincludes time spent with patient/family, chart review, provider coordination, and documentation. More than 50% of the time in this consultation  was spent on coordinating communication  [name] F Emoryis a 79 y.o.year oldmalewith multiple medical problems including COPD Dementia, Hx of prostate CAper [name],hypertension.Palliative Care was asked to help address goals of care.   CODE STATUS:DNR  PPS:30%  HOSPICE ELIGIBILITY/DIAGNOSIS: TBD  PAST MEDICAL HISTORY:  Past Medical History:  Diagnosis Date  . Anemia   . Arthritis   . Cancer ([place])   . COPD (chronic obstructive pulmonary disease) ([place])   . Diabetes mellitus   . Hyperlipidemia   . Hypertension   . Prostate cancer [facility])     SOCIAL HX:  Social History   Tobacco Use  . Smoking status: Former Smoker    Quit date: 04/11/2005    Years since quitting: 15.2  . Smokeless tobacco: Never Used  Substance Use Topics  . Alcohol use: No    Comment: quit 8 weeks ago    ALLERGIES:  Allergies  Allergen Reactions  . Oxycodone Itching and Other (See Comments)    Only intolerant when takes with Diphenhydramine, altered mental status     PERTINENT MEDICATIONS:  Outpatient Encounter Medications as of 07/15/2020  Medication Sig  . albuterol (PROVENTIL) (2.5 MG/3ML) 0.083% nebulizer solution Take 3 mLs (2.5 mg total) by nebulization every 6 (six) hours.  Marland Kitchen aspirin 325 MG tablet Take 325 mg by mouth daily.  Marland Kitchen atorvastatin (LIPITOR) 10 MG tablet Take 10 mg by mouth daily.  . budesonide-formoterol (SYMBICORT) 160-4.5 MCG/ACT inhaler Inhale 2 puffs into the lungs 2 (two) times daily.  . cholecalciferol (VITAMIN D) 1000 UNITS tablet Take 1,000 Units by mouth daily.  . collagenase (SANTYL) ointment Apply 1 application topically daily. Apply to the affected area daily plus dry dressing  . cyanocobalamin 100 MCG tablet Take 1 tablet (100 mcg total) by mouth daily.  . divalproex (DEPAKOTE) 250 MG DR tablet Take 250 mg by mouth 2 (two) times daily.  Marland Kitchen doxycycline (VIBRA-TABS) 100 MG tablet Take 1 tablet (100 mg total) by mouth every 12 (twelve)  hours.  . ferrous sulfate 325 (65 FE) MG tablet Take 1 tablet (325 mg total) by mouth daily with breakfast. Take with Calcium (Patient taking differently: Take 325 mg by mouth 2 (two) times daily with a meal. )  . finasteride (PROSCAR) 5 MG tablet Take 5 mg by mouth daily.  Marland Kitchen FLUoxetine (PROZAC) 20 MG capsule Take 60 mg by mouth daily.   . fluticasone furoate-vilanterol (BREO ELLIPTA) 100-25 MCG/INH AEPB Inhale 1 puff into the lungs daily.  . furosemide (LASIX) 20 MG tablet Take 20 mg by mouth daily.  Marland Kitchen gabapentin (NEURONTIN) 300 MG capsule Take 300 mg by mouth at bedtime.  Marland Kitchen glipiZIDE (GLUCOTROL) 5 MG tablet Take 0.5 tablets (2.5 mg total) by mouth 2 (two) times daily before a meal.  . metFORMIN (GLUCOPHAGE) 1000 MG tablet Take 1,000 mg by mouth 2 (two) times daily with a meal.   . Multiple Vitamin (MULTIVITAMIN WITH MINERALS) TABS tablet Take 1 tablet by mouth daily.  . nicotine (NICODERM CQ - DOSED IN MG/24 HR) 7 mg/24hr patch Place 1 patch (7 mg total) onto the skin daily.  . predniSONE (DELTASONE) 20 MG tablet Take 1 tablet (20 mg total) by mouth daily with breakfast.  . senna-docusate (SENOKOT-S) 8.6-50 MG tablet Take 1 tablet by mouth daily as needed for mild constipation.  Marland Kitchen tiotropium (  SPIRIVA) 18 MCG inhalation capsule Place 18 mcg into inhaler and inhale daily.  . vitamin C (ASCORBIC ACID) 500 MG tablet Take 500 mg by mouth daily.   No facility-administered encounter medications on file as of 07/15/2020.    Teodoro Spray, NP

## 2020-08-01 ENCOUNTER — Ambulatory Visit: Payer: Medicare HMO | Admitting: Neurology

## 2020-09-19 DIAGNOSIS — J439 Emphysema, unspecified: Secondary | ICD-10-CM | POA: Diagnosis not present

## 2020-09-19 DIAGNOSIS — E1151 Type 2 diabetes mellitus with diabetic peripheral angiopathy without gangrene: Secondary | ICD-10-CM | POA: Diagnosis not present

## 2020-09-19 DIAGNOSIS — N1831 Chronic kidney disease, stage 3a: Secondary | ICD-10-CM | POA: Diagnosis not present

## 2020-09-19 DIAGNOSIS — I129 Hypertensive chronic kidney disease with stage 1 through stage 4 chronic kidney disease, or unspecified chronic kidney disease: Secondary | ICD-10-CM | POA: Diagnosis not present

## 2020-09-19 DIAGNOSIS — L89322 Pressure ulcer of left buttock, stage 2: Secondary | ICD-10-CM | POA: Diagnosis not present

## 2020-09-19 DIAGNOSIS — E1122 Type 2 diabetes mellitus with diabetic chronic kidney disease: Secondary | ICD-10-CM | POA: Diagnosis not present

## 2020-09-19 DIAGNOSIS — L89621 Pressure ulcer of left heel, stage 1: Secondary | ICD-10-CM | POA: Diagnosis not present

## 2020-09-19 DIAGNOSIS — J9611 Chronic respiratory failure with hypoxia: Secondary | ICD-10-CM | POA: Diagnosis not present

## 2020-09-19 DIAGNOSIS — I872 Venous insufficiency (chronic) (peripheral): Secondary | ICD-10-CM | POA: Diagnosis not present

## 2020-09-23 DIAGNOSIS — L89621 Pressure ulcer of left heel, stage 1: Secondary | ICD-10-CM | POA: Diagnosis not present

## 2020-09-23 DIAGNOSIS — I872 Venous insufficiency (chronic) (peripheral): Secondary | ICD-10-CM | POA: Diagnosis not present

## 2020-09-23 DIAGNOSIS — E1151 Type 2 diabetes mellitus with diabetic peripheral angiopathy without gangrene: Secondary | ICD-10-CM | POA: Diagnosis not present

## 2020-09-23 DIAGNOSIS — J9611 Chronic respiratory failure with hypoxia: Secondary | ICD-10-CM | POA: Diagnosis not present

## 2020-09-23 DIAGNOSIS — J439 Emphysema, unspecified: Secondary | ICD-10-CM | POA: Diagnosis not present

## 2020-09-23 DIAGNOSIS — I129 Hypertensive chronic kidney disease with stage 1 through stage 4 chronic kidney disease, or unspecified chronic kidney disease: Secondary | ICD-10-CM | POA: Diagnosis not present

## 2020-09-23 DIAGNOSIS — L89322 Pressure ulcer of left buttock, stage 2: Secondary | ICD-10-CM | POA: Diagnosis not present

## 2020-09-23 DIAGNOSIS — N1831 Chronic kidney disease, stage 3a: Secondary | ICD-10-CM | POA: Diagnosis not present

## 2020-09-23 DIAGNOSIS — E1122 Type 2 diabetes mellitus with diabetic chronic kidney disease: Secondary | ICD-10-CM | POA: Diagnosis not present

## 2020-09-26 DIAGNOSIS — I129 Hypertensive chronic kidney disease with stage 1 through stage 4 chronic kidney disease, or unspecified chronic kidney disease: Secondary | ICD-10-CM | POA: Diagnosis not present

## 2020-09-26 DIAGNOSIS — E1122 Type 2 diabetes mellitus with diabetic chronic kidney disease: Secondary | ICD-10-CM | POA: Diagnosis not present

## 2020-09-26 DIAGNOSIS — J9611 Chronic respiratory failure with hypoxia: Secondary | ICD-10-CM | POA: Diagnosis not present

## 2020-09-26 DIAGNOSIS — N1831 Chronic kidney disease, stage 3a: Secondary | ICD-10-CM | POA: Diagnosis not present

## 2020-09-26 DIAGNOSIS — J439 Emphysema, unspecified: Secondary | ICD-10-CM | POA: Diagnosis not present

## 2020-09-26 DIAGNOSIS — I872 Venous insufficiency (chronic) (peripheral): Secondary | ICD-10-CM | POA: Diagnosis not present

## 2020-09-26 DIAGNOSIS — E1151 Type 2 diabetes mellitus with diabetic peripheral angiopathy without gangrene: Secondary | ICD-10-CM | POA: Diagnosis not present

## 2020-09-26 DIAGNOSIS — L89322 Pressure ulcer of left buttock, stage 2: Secondary | ICD-10-CM | POA: Diagnosis not present

## 2020-09-26 DIAGNOSIS — L89621 Pressure ulcer of left heel, stage 1: Secondary | ICD-10-CM | POA: Diagnosis not present

## 2020-09-30 DIAGNOSIS — I129 Hypertensive chronic kidney disease with stage 1 through stage 4 chronic kidney disease, or unspecified chronic kidney disease: Secondary | ICD-10-CM | POA: Diagnosis not present

## 2020-09-30 DIAGNOSIS — I872 Venous insufficiency (chronic) (peripheral): Secondary | ICD-10-CM | POA: Diagnosis not present

## 2020-09-30 DIAGNOSIS — L89322 Pressure ulcer of left buttock, stage 2: Secondary | ICD-10-CM | POA: Diagnosis not present

## 2020-09-30 DIAGNOSIS — E1151 Type 2 diabetes mellitus with diabetic peripheral angiopathy without gangrene: Secondary | ICD-10-CM | POA: Diagnosis not present

## 2020-09-30 DIAGNOSIS — L89621 Pressure ulcer of left heel, stage 1: Secondary | ICD-10-CM | POA: Diagnosis not present

## 2020-09-30 DIAGNOSIS — E1122 Type 2 diabetes mellitus with diabetic chronic kidney disease: Secondary | ICD-10-CM | POA: Diagnosis not present

## 2020-09-30 DIAGNOSIS — J9611 Chronic respiratory failure with hypoxia: Secondary | ICD-10-CM | POA: Diagnosis not present

## 2020-09-30 DIAGNOSIS — N1831 Chronic kidney disease, stage 3a: Secondary | ICD-10-CM | POA: Diagnosis not present

## 2020-09-30 DIAGNOSIS — J439 Emphysema, unspecified: Secondary | ICD-10-CM | POA: Diagnosis not present

## 2020-10-03 DIAGNOSIS — I872 Venous insufficiency (chronic) (peripheral): Secondary | ICD-10-CM | POA: Diagnosis not present

## 2020-10-03 DIAGNOSIS — E1122 Type 2 diabetes mellitus with diabetic chronic kidney disease: Secondary | ICD-10-CM | POA: Diagnosis not present

## 2020-10-03 DIAGNOSIS — J439 Emphysema, unspecified: Secondary | ICD-10-CM | POA: Diagnosis not present

## 2020-10-03 DIAGNOSIS — E1151 Type 2 diabetes mellitus with diabetic peripheral angiopathy without gangrene: Secondary | ICD-10-CM | POA: Diagnosis not present

## 2020-10-03 DIAGNOSIS — J9611 Chronic respiratory failure with hypoxia: Secondary | ICD-10-CM | POA: Diagnosis not present

## 2020-10-03 DIAGNOSIS — N1831 Chronic kidney disease, stage 3a: Secondary | ICD-10-CM | POA: Diagnosis not present

## 2020-10-03 DIAGNOSIS — L89322 Pressure ulcer of left buttock, stage 2: Secondary | ICD-10-CM | POA: Diagnosis not present

## 2020-10-03 DIAGNOSIS — I129 Hypertensive chronic kidney disease with stage 1 through stage 4 chronic kidney disease, or unspecified chronic kidney disease: Secondary | ICD-10-CM | POA: Diagnosis not present

## 2020-10-03 DIAGNOSIS — L89621 Pressure ulcer of left heel, stage 1: Secondary | ICD-10-CM | POA: Diagnosis not present

## 2020-10-07 DIAGNOSIS — N1831 Chronic kidney disease, stage 3a: Secondary | ICD-10-CM | POA: Diagnosis not present

## 2020-10-07 DIAGNOSIS — L89322 Pressure ulcer of left buttock, stage 2: Secondary | ICD-10-CM | POA: Diagnosis not present

## 2020-10-07 DIAGNOSIS — J439 Emphysema, unspecified: Secondary | ICD-10-CM | POA: Diagnosis not present

## 2020-10-07 DIAGNOSIS — I129 Hypertensive chronic kidney disease with stage 1 through stage 4 chronic kidney disease, or unspecified chronic kidney disease: Secondary | ICD-10-CM | POA: Diagnosis not present

## 2020-10-07 DIAGNOSIS — J9611 Chronic respiratory failure with hypoxia: Secondary | ICD-10-CM | POA: Diagnosis not present

## 2020-10-07 DIAGNOSIS — E1122 Type 2 diabetes mellitus with diabetic chronic kidney disease: Secondary | ICD-10-CM | POA: Diagnosis not present

## 2020-10-07 DIAGNOSIS — L89621 Pressure ulcer of left heel, stage 1: Secondary | ICD-10-CM | POA: Diagnosis not present

## 2020-10-07 DIAGNOSIS — I872 Venous insufficiency (chronic) (peripheral): Secondary | ICD-10-CM | POA: Diagnosis not present

## 2020-10-07 DIAGNOSIS — E1151 Type 2 diabetes mellitus with diabetic peripheral angiopathy without gangrene: Secondary | ICD-10-CM | POA: Diagnosis not present

## 2020-10-10 ENCOUNTER — Ambulatory Visit: Payer: Medicare HMO | Admitting: Neurology

## 2020-10-10 ENCOUNTER — Encounter: Payer: Self-pay | Admitting: Neurology

## 2020-10-10 DIAGNOSIS — L89621 Pressure ulcer of left heel, stage 1: Secondary | ICD-10-CM | POA: Diagnosis not present

## 2020-10-10 DIAGNOSIS — N1831 Chronic kidney disease, stage 3a: Secondary | ICD-10-CM | POA: Diagnosis not present

## 2020-10-10 DIAGNOSIS — L89322 Pressure ulcer of left buttock, stage 2: Secondary | ICD-10-CM | POA: Diagnosis not present

## 2020-10-10 DIAGNOSIS — I129 Hypertensive chronic kidney disease with stage 1 through stage 4 chronic kidney disease, or unspecified chronic kidney disease: Secondary | ICD-10-CM | POA: Diagnosis not present

## 2020-10-10 DIAGNOSIS — E1122 Type 2 diabetes mellitus with diabetic chronic kidney disease: Secondary | ICD-10-CM | POA: Diagnosis not present

## 2020-10-10 DIAGNOSIS — J9611 Chronic respiratory failure with hypoxia: Secondary | ICD-10-CM | POA: Diagnosis not present

## 2020-10-10 DIAGNOSIS — I872 Venous insufficiency (chronic) (peripheral): Secondary | ICD-10-CM | POA: Diagnosis not present

## 2020-10-10 DIAGNOSIS — J439 Emphysema, unspecified: Secondary | ICD-10-CM | POA: Diagnosis not present

## 2020-10-10 DIAGNOSIS — E1151 Type 2 diabetes mellitus with diabetic peripheral angiopathy without gangrene: Secondary | ICD-10-CM | POA: Diagnosis not present

## 2020-10-14 ENCOUNTER — Ambulatory Visit: Payer: Medicare HMO | Admitting: Neurology

## 2020-10-14 DIAGNOSIS — L89621 Pressure ulcer of left heel, stage 1: Secondary | ICD-10-CM | POA: Diagnosis not present

## 2020-10-14 DIAGNOSIS — L89322 Pressure ulcer of left buttock, stage 2: Secondary | ICD-10-CM | POA: Diagnosis not present

## 2020-10-14 DIAGNOSIS — I872 Venous insufficiency (chronic) (peripheral): Secondary | ICD-10-CM | POA: Diagnosis not present

## 2020-10-14 DIAGNOSIS — N1831 Chronic kidney disease, stage 3a: Secondary | ICD-10-CM | POA: Diagnosis not present

## 2020-10-14 DIAGNOSIS — J439 Emphysema, unspecified: Secondary | ICD-10-CM | POA: Diagnosis not present

## 2020-10-14 DIAGNOSIS — E1151 Type 2 diabetes mellitus with diabetic peripheral angiopathy without gangrene: Secondary | ICD-10-CM | POA: Diagnosis not present

## 2020-10-14 DIAGNOSIS — J9611 Chronic respiratory failure with hypoxia: Secondary | ICD-10-CM | POA: Diagnosis not present

## 2020-10-14 DIAGNOSIS — E1122 Type 2 diabetes mellitus with diabetic chronic kidney disease: Secondary | ICD-10-CM | POA: Diagnosis not present

## 2020-10-14 DIAGNOSIS — I129 Hypertensive chronic kidney disease with stage 1 through stage 4 chronic kidney disease, or unspecified chronic kidney disease: Secondary | ICD-10-CM | POA: Diagnosis not present

## 2020-10-17 DIAGNOSIS — L89621 Pressure ulcer of left heel, stage 1: Secondary | ICD-10-CM | POA: Diagnosis not present

## 2020-10-17 DIAGNOSIS — N1831 Chronic kidney disease, stage 3a: Secondary | ICD-10-CM | POA: Diagnosis not present

## 2020-10-17 DIAGNOSIS — J9611 Chronic respiratory failure with hypoxia: Secondary | ICD-10-CM | POA: Diagnosis not present

## 2020-10-17 DIAGNOSIS — E1122 Type 2 diabetes mellitus with diabetic chronic kidney disease: Secondary | ICD-10-CM | POA: Diagnosis not present

## 2020-10-17 DIAGNOSIS — I129 Hypertensive chronic kidney disease with stage 1 through stage 4 chronic kidney disease, or unspecified chronic kidney disease: Secondary | ICD-10-CM | POA: Diagnosis not present

## 2020-10-17 DIAGNOSIS — E1151 Type 2 diabetes mellitus with diabetic peripheral angiopathy without gangrene: Secondary | ICD-10-CM | POA: Diagnosis not present

## 2020-10-17 DIAGNOSIS — L89322 Pressure ulcer of left buttock, stage 2: Secondary | ICD-10-CM | POA: Diagnosis not present

## 2020-10-17 DIAGNOSIS — I872 Venous insufficiency (chronic) (peripheral): Secondary | ICD-10-CM | POA: Diagnosis not present

## 2020-10-17 DIAGNOSIS — J439 Emphysema, unspecified: Secondary | ICD-10-CM | POA: Diagnosis not present

## 2020-10-24 DIAGNOSIS — L89322 Pressure ulcer of left buttock, stage 2: Secondary | ICD-10-CM | POA: Diagnosis not present

## 2020-10-24 DIAGNOSIS — I872 Venous insufficiency (chronic) (peripheral): Secondary | ICD-10-CM | POA: Diagnosis not present

## 2020-10-24 DIAGNOSIS — L89621 Pressure ulcer of left heel, stage 1: Secondary | ICD-10-CM | POA: Diagnosis not present

## 2020-10-24 DIAGNOSIS — J439 Emphysema, unspecified: Secondary | ICD-10-CM | POA: Diagnosis not present

## 2020-10-24 DIAGNOSIS — E1122 Type 2 diabetes mellitus with diabetic chronic kidney disease: Secondary | ICD-10-CM | POA: Diagnosis not present

## 2020-10-24 DIAGNOSIS — E1151 Type 2 diabetes mellitus with diabetic peripheral angiopathy without gangrene: Secondary | ICD-10-CM | POA: Diagnosis not present

## 2020-10-24 DIAGNOSIS — J9611 Chronic respiratory failure with hypoxia: Secondary | ICD-10-CM | POA: Diagnosis not present

## 2020-10-24 DIAGNOSIS — N1831 Chronic kidney disease, stage 3a: Secondary | ICD-10-CM | POA: Diagnosis not present

## 2020-10-24 DIAGNOSIS — I129 Hypertensive chronic kidney disease with stage 1 through stage 4 chronic kidney disease, or unspecified chronic kidney disease: Secondary | ICD-10-CM | POA: Diagnosis not present

## 2020-10-26 DIAGNOSIS — J189 Pneumonia, unspecified organism: Secondary | ICD-10-CM | POA: Diagnosis not present

## 2020-10-26 DIAGNOSIS — J90 Pleural effusion, not elsewhere classified: Secondary | ICD-10-CM | POA: Diagnosis not present

## 2020-10-28 DIAGNOSIS — E1122 Type 2 diabetes mellitus with diabetic chronic kidney disease: Secondary | ICD-10-CM | POA: Diagnosis not present

## 2020-10-28 DIAGNOSIS — R918 Other nonspecific abnormal finding of lung field: Secondary | ICD-10-CM | POA: Diagnosis not present

## 2020-10-28 DIAGNOSIS — M2550 Pain in unspecified joint: Secondary | ICD-10-CM | POA: Diagnosis not present

## 2020-10-28 DIAGNOSIS — R0602 Shortness of breath: Secondary | ICD-10-CM | POA: Diagnosis not present

## 2020-10-28 DIAGNOSIS — G9341 Metabolic encephalopathy: Secondary | ICD-10-CM | POA: Diagnosis not present

## 2020-10-28 DIAGNOSIS — R0989 Other specified symptoms and signs involving the circulatory and respiratory systems: Secondary | ICD-10-CM | POA: Diagnosis not present

## 2020-10-28 DIAGNOSIS — J9620 Acute and chronic respiratory failure, unspecified whether with hypoxia or hypercapnia: Secondary | ICD-10-CM | POA: Diagnosis not present

## 2020-10-28 DIAGNOSIS — I493 Ventricular premature depolarization: Secondary | ICD-10-CM | POA: Diagnosis not present

## 2020-10-28 DIAGNOSIS — A403 Sepsis due to Streptococcus pneumoniae: Secondary | ICD-10-CM | POA: Diagnosis not present

## 2020-10-28 DIAGNOSIS — E1151 Type 2 diabetes mellitus with diabetic peripheral angiopathy without gangrene: Secondary | ICD-10-CM | POA: Diagnosis not present

## 2020-10-28 DIAGNOSIS — J9621 Acute and chronic respiratory failure with hypoxia: Secondary | ICD-10-CM | POA: Diagnosis not present

## 2020-10-28 DIAGNOSIS — R0689 Other abnormalities of breathing: Secondary | ICD-10-CM | POA: Diagnosis not present

## 2020-10-28 DIAGNOSIS — J984 Other disorders of lung: Secondary | ICD-10-CM | POA: Diagnosis not present

## 2020-10-28 DIAGNOSIS — J441 Chronic obstructive pulmonary disease with (acute) exacerbation: Secondary | ICD-10-CM | POA: Diagnosis not present

## 2020-10-28 DIAGNOSIS — R000 Tachycardia, unspecified: Secondary | ICD-10-CM | POA: Diagnosis not present

## 2020-10-28 DIAGNOSIS — Z7401 Bed confinement status: Secondary | ICD-10-CM | POA: Diagnosis not present

## 2020-10-28 DIAGNOSIS — E872 Acidosis: Secondary | ICD-10-CM | POA: Diagnosis not present

## 2020-10-28 DIAGNOSIS — J80 Acute respiratory distress syndrome: Secondary | ICD-10-CM | POA: Diagnosis not present

## 2020-10-28 DIAGNOSIS — L89621 Pressure ulcer of left heel, stage 1: Secondary | ICD-10-CM | POA: Diagnosis not present

## 2020-10-28 DIAGNOSIS — F0390 Unspecified dementia without behavioral disturbance: Secondary | ICD-10-CM | POA: Diagnosis not present

## 2020-10-28 DIAGNOSIS — J9611 Chronic respiratory failure with hypoxia: Secondary | ICD-10-CM | POA: Diagnosis not present

## 2020-10-28 DIAGNOSIS — J449 Chronic obstructive pulmonary disease, unspecified: Secondary | ICD-10-CM | POA: Diagnosis not present

## 2020-10-28 DIAGNOSIS — I129 Hypertensive chronic kidney disease with stage 1 through stage 4 chronic kidney disease, or unspecified chronic kidney disease: Secondary | ICD-10-CM | POA: Diagnosis not present

## 2020-10-28 DIAGNOSIS — R0902 Hypoxemia: Secondary | ICD-10-CM | POA: Diagnosis not present

## 2020-10-28 DIAGNOSIS — N1831 Chronic kidney disease, stage 3a: Secondary | ICD-10-CM | POA: Diagnosis not present

## 2020-10-28 DIAGNOSIS — F05 Delirium due to known physiological condition: Secondary | ICD-10-CM | POA: Diagnosis not present

## 2020-10-28 DIAGNOSIS — I872 Venous insufficiency (chronic) (peripheral): Secondary | ICD-10-CM | POA: Diagnosis not present

## 2020-10-28 DIAGNOSIS — A409 Streptococcal sepsis, unspecified: Secondary | ICD-10-CM | POA: Diagnosis not present

## 2020-10-28 DIAGNOSIS — J439 Emphysema, unspecified: Secondary | ICD-10-CM | POA: Diagnosis not present

## 2020-10-28 DIAGNOSIS — N183 Chronic kidney disease, stage 3 unspecified: Secondary | ICD-10-CM | POA: Diagnosis not present

## 2020-10-28 DIAGNOSIS — R1312 Dysphagia, oropharyngeal phase: Secondary | ICD-10-CM | POA: Diagnosis not present

## 2020-10-28 DIAGNOSIS — L89322 Pressure ulcer of left buttock, stage 2: Secondary | ICD-10-CM | POA: Diagnosis not present

## 2020-10-28 DIAGNOSIS — A419 Sepsis, unspecified organism: Secondary | ICD-10-CM | POA: Diagnosis not present

## 2020-10-28 DIAGNOSIS — Z20822 Contact with and (suspected) exposure to covid-19: Secondary | ICD-10-CM | POA: Diagnosis not present

## 2020-10-28 DIAGNOSIS — J189 Pneumonia, unspecified organism: Secondary | ICD-10-CM | POA: Diagnosis not present

## 2020-10-28 DIAGNOSIS — Z781 Physical restraint status: Secondary | ICD-10-CM | POA: Diagnosis not present

## 2020-10-28 DIAGNOSIS — R069 Unspecified abnormalities of breathing: Secondary | ICD-10-CM | POA: Diagnosis not present

## 2020-10-28 DIAGNOSIS — J440 Chronic obstructive pulmonary disease with acute lower respiratory infection: Secondary | ICD-10-CM | POA: Diagnosis not present

## 2020-10-28 DIAGNOSIS — J159 Unspecified bacterial pneumonia: Secondary | ICD-10-CM | POA: Diagnosis not present

## 2020-10-28 DIAGNOSIS — R531 Weakness: Secondary | ICD-10-CM | POA: Diagnosis not present

## 2020-11-09 DIAGNOSIS — I110 Hypertensive heart disease with heart failure: Secondary | ICD-10-CM | POA: Diagnosis not present

## 2020-11-09 DIAGNOSIS — D72829 Elevated white blood cell count, unspecified: Secondary | ICD-10-CM | POA: Diagnosis not present

## 2020-11-09 DIAGNOSIS — I491 Atrial premature depolarization: Secondary | ICD-10-CM | POA: Diagnosis not present

## 2020-11-09 DIAGNOSIS — J449 Chronic obstructive pulmonary disease, unspecified: Secondary | ICD-10-CM | POA: Diagnosis not present

## 2020-11-09 DIAGNOSIS — N179 Acute kidney failure, unspecified: Secondary | ICD-10-CM | POA: Diagnosis not present

## 2020-11-09 DIAGNOSIS — J15212 Pneumonia due to Methicillin resistant Staphylococcus aureus: Secondary | ICD-10-CM | POA: Diagnosis not present

## 2020-11-09 DIAGNOSIS — G9340 Encephalopathy, unspecified: Secondary | ICD-10-CM | POA: Diagnosis not present

## 2020-11-09 DIAGNOSIS — F0390 Unspecified dementia without behavioral disturbance: Secondary | ICD-10-CM | POA: Diagnosis not present

## 2020-11-09 DIAGNOSIS — D696 Thrombocytopenia, unspecified: Secondary | ICD-10-CM | POA: Diagnosis not present

## 2020-11-09 DIAGNOSIS — R1310 Dysphagia, unspecified: Secondary | ICD-10-CM | POA: Diagnosis not present

## 2020-11-09 DIAGNOSIS — R531 Weakness: Secondary | ICD-10-CM | POA: Diagnosis not present

## 2020-11-09 DIAGNOSIS — Z20822 Contact with and (suspected) exposure to covid-19: Secondary | ICD-10-CM | POA: Diagnosis not present

## 2020-11-09 DIAGNOSIS — I10 Essential (primary) hypertension: Secondary | ICD-10-CM | POA: Diagnosis not present

## 2020-11-09 DIAGNOSIS — I509 Heart failure, unspecified: Secondary | ICD-10-CM | POA: Diagnosis not present

## 2020-11-09 DIAGNOSIS — Z9981 Dependence on supplemental oxygen: Secondary | ICD-10-CM | POA: Diagnosis not present

## 2020-11-09 DIAGNOSIS — R0600 Dyspnea, unspecified: Secondary | ICD-10-CM | POA: Diagnosis not present

## 2020-11-09 DIAGNOSIS — R279 Unspecified lack of coordination: Secondary | ICD-10-CM | POA: Diagnosis not present

## 2020-11-09 DIAGNOSIS — J9811 Atelectasis: Secondary | ICD-10-CM | POA: Diagnosis not present

## 2020-11-09 DIAGNOSIS — J810 Acute pulmonary edema: Secondary | ICD-10-CM | POA: Diagnosis not present

## 2020-11-09 DIAGNOSIS — J690 Pneumonitis due to inhalation of food and vomit: Secondary | ICD-10-CM | POA: Diagnosis not present

## 2020-11-09 DIAGNOSIS — J9601 Acute respiratory failure with hypoxia: Secondary | ICD-10-CM | POA: Diagnosis not present

## 2020-11-09 DIAGNOSIS — I5033 Acute on chronic diastolic (congestive) heart failure: Secondary | ICD-10-CM | POA: Diagnosis not present

## 2020-11-09 DIAGNOSIS — N3946 Mixed incontinence: Secondary | ICD-10-CM | POA: Diagnosis not present

## 2020-11-09 DIAGNOSIS — U071 COVID-19: Secondary | ICD-10-CM | POA: Diagnosis not present

## 2020-11-09 DIAGNOSIS — R062 Wheezing: Secondary | ICD-10-CM | POA: Diagnosis not present

## 2020-11-09 DIAGNOSIS — L89623 Pressure ulcer of left heel, stage 3: Secondary | ICD-10-CM | POA: Diagnosis not present

## 2020-11-09 DIAGNOSIS — E785 Hyperlipidemia, unspecified: Secondary | ICD-10-CM | POA: Diagnosis not present

## 2020-11-09 DIAGNOSIS — J189 Pneumonia, unspecified organism: Secondary | ICD-10-CM | POA: Diagnosis not present

## 2020-11-09 DIAGNOSIS — R000 Tachycardia, unspecified: Secondary | ICD-10-CM | POA: Diagnosis not present

## 2020-11-09 DIAGNOSIS — R0602 Shortness of breath: Secondary | ICD-10-CM | POA: Diagnosis not present

## 2020-11-09 DIAGNOSIS — J9621 Acute and chronic respiratory failure with hypoxia: Secondary | ICD-10-CM | POA: Diagnosis not present

## 2020-11-09 DIAGNOSIS — L309 Dermatitis, unspecified: Secondary | ICD-10-CM | POA: Diagnosis not present

## 2020-11-09 DIAGNOSIS — E878 Other disorders of electrolyte and fluid balance, not elsewhere classified: Secondary | ICD-10-CM | POA: Diagnosis not present

## 2020-11-09 DIAGNOSIS — Z743 Need for continuous supervision: Secondary | ICD-10-CM | POA: Diagnosis not present

## 2020-11-09 DIAGNOSIS — R0902 Hypoxemia: Secondary | ICD-10-CM | POA: Diagnosis not present

## 2020-11-09 DIAGNOSIS — J9611 Chronic respiratory failure with hypoxia: Secondary | ICD-10-CM | POA: Diagnosis not present

## 2020-11-11 DIAGNOSIS — R0902 Hypoxemia: Secondary | ICD-10-CM | POA: Diagnosis not present

## 2020-11-11 DIAGNOSIS — I509 Heart failure, unspecified: Secondary | ICD-10-CM | POA: Diagnosis not present

## 2020-11-11 DIAGNOSIS — Z20822 Contact with and (suspected) exposure to covid-19: Secondary | ICD-10-CM | POA: Diagnosis not present

## 2020-11-11 DIAGNOSIS — I110 Hypertensive heart disease with heart failure: Secondary | ICD-10-CM | POA: Diagnosis not present

## 2020-11-11 DIAGNOSIS — J810 Acute pulmonary edema: Secondary | ICD-10-CM | POA: Diagnosis not present

## 2020-11-11 DIAGNOSIS — R1310 Dysphagia, unspecified: Secondary | ICD-10-CM | POA: Diagnosis not present

## 2020-11-12 DIAGNOSIS — R000 Tachycardia, unspecified: Secondary | ICD-10-CM | POA: Diagnosis not present

## 2020-11-12 DIAGNOSIS — J810 Acute pulmonary edema: Secondary | ICD-10-CM | POA: Diagnosis not present

## 2020-11-15 DIAGNOSIS — J9601 Acute respiratory failure with hypoxia: Secondary | ICD-10-CM | POA: Diagnosis not present

## 2020-11-15 DIAGNOSIS — R000 Tachycardia, unspecified: Secondary | ICD-10-CM | POA: Diagnosis not present

## 2020-11-15 DIAGNOSIS — D72829 Elevated white blood cell count, unspecified: Secondary | ICD-10-CM | POA: Diagnosis not present

## 2020-11-15 DIAGNOSIS — I491 Atrial premature depolarization: Secondary | ICD-10-CM | POA: Diagnosis not present

## 2020-11-21 DIAGNOSIS — J690 Pneumonitis due to inhalation of food and vomit: Secondary | ICD-10-CM | POA: Diagnosis not present

## 2020-11-21 DIAGNOSIS — J449 Chronic obstructive pulmonary disease, unspecified: Secondary | ICD-10-CM | POA: Diagnosis not present

## 2020-11-21 DIAGNOSIS — I5033 Acute on chronic diastolic (congestive) heart failure: Secondary | ICD-10-CM | POA: Diagnosis not present

## 2020-11-21 DIAGNOSIS — N3946 Mixed incontinence: Secondary | ICD-10-CM | POA: Diagnosis not present

## 2020-11-21 DIAGNOSIS — N179 Acute kidney failure, unspecified: Secondary | ICD-10-CM | POA: Diagnosis not present

## 2020-11-22 DIAGNOSIS — L89312 Pressure ulcer of right buttock, stage 2: Secondary | ICD-10-CM | POA: Diagnosis not present

## 2020-11-22 DIAGNOSIS — Z466 Encounter for fitting and adjustment of urinary device: Secondary | ICD-10-CM | POA: Diagnosis not present

## 2020-11-22 DIAGNOSIS — I130 Hypertensive heart and chronic kidney disease with heart failure and stage 1 through stage 4 chronic kidney disease, or unspecified chronic kidney disease: Secondary | ICD-10-CM | POA: Diagnosis not present

## 2020-11-22 DIAGNOSIS — L89621 Pressure ulcer of left heel, stage 1: Secondary | ICD-10-CM | POA: Diagnosis not present

## 2020-11-22 DIAGNOSIS — N400 Enlarged prostate without lower urinary tract symptoms: Secondary | ICD-10-CM | POA: Diagnosis not present

## 2020-11-22 DIAGNOSIS — E1122 Type 2 diabetes mellitus with diabetic chronic kidney disease: Secondary | ICD-10-CM | POA: Diagnosis not present

## 2020-11-22 DIAGNOSIS — L89322 Pressure ulcer of left buttock, stage 2: Secondary | ICD-10-CM | POA: Diagnosis not present

## 2020-11-22 DIAGNOSIS — I5032 Chronic diastolic (congestive) heart failure: Secondary | ICD-10-CM | POA: Diagnosis not present

## 2020-11-22 DIAGNOSIS — N183 Chronic kidney disease, stage 3 unspecified: Secondary | ICD-10-CM | POA: Diagnosis not present

## 2020-11-25 DIAGNOSIS — I130 Hypertensive heart and chronic kidney disease with heart failure and stage 1 through stage 4 chronic kidney disease, or unspecified chronic kidney disease: Secondary | ICD-10-CM | POA: Diagnosis not present

## 2020-11-25 DIAGNOSIS — L89322 Pressure ulcer of left buttock, stage 2: Secondary | ICD-10-CM | POA: Diagnosis not present

## 2020-11-25 DIAGNOSIS — I5032 Chronic diastolic (congestive) heart failure: Secondary | ICD-10-CM | POA: Diagnosis not present

## 2020-11-25 DIAGNOSIS — L89312 Pressure ulcer of right buttock, stage 2: Secondary | ICD-10-CM | POA: Diagnosis not present

## 2020-11-25 DIAGNOSIS — N183 Chronic kidney disease, stage 3 unspecified: Secondary | ICD-10-CM | POA: Diagnosis not present

## 2020-11-25 DIAGNOSIS — N400 Enlarged prostate without lower urinary tract symptoms: Secondary | ICD-10-CM | POA: Diagnosis not present

## 2020-11-25 DIAGNOSIS — Z466 Encounter for fitting and adjustment of urinary device: Secondary | ICD-10-CM | POA: Diagnosis not present

## 2020-11-25 DIAGNOSIS — E1122 Type 2 diabetes mellitus with diabetic chronic kidney disease: Secondary | ICD-10-CM | POA: Diagnosis not present

## 2020-11-25 DIAGNOSIS — L89621 Pressure ulcer of left heel, stage 1: Secondary | ICD-10-CM | POA: Diagnosis not present

## 2020-11-27 ENCOUNTER — Other Ambulatory Visit: Payer: Self-pay

## 2020-11-27 ENCOUNTER — Other Ambulatory Visit: Payer: Medicare HMO | Admitting: Hospice

## 2020-11-27 DIAGNOSIS — J449 Chronic obstructive pulmonary disease, unspecified: Secondary | ICD-10-CM

## 2020-11-27 DIAGNOSIS — T17908S Unspecified foreign body in respiratory tract, part unspecified causing other injury, sequela: Secondary | ICD-10-CM

## 2020-11-27 DIAGNOSIS — Z515 Encounter for palliative care: Secondary | ICD-10-CM | POA: Diagnosis not present

## 2020-11-27 NOTE — Progress Notes (Signed)
[facility] Consult Note Telephone: [phone]  Fax: [phone]  PATIENT NAME: [name] [name] DOB: [birth year] MRN: [id number]  PRIMARY CARE PROVIDER:   Prince Solian, MD Prince Solian, MD [address] [place],  [place] [address]  REFERRING PROVIDER: Prince Solian, MD Prince Solian, [place],  [place] [address]  [place]. [name]'s brother Rocko Fesperman Jnr [phone]  TELEHEALTH VISIT STATEMENT Due to the COVID-19 crisis, this visit was done via telephone from my office. It was initiated and consented to by this patient and/or family. [name] declined in-person visit due to patient having many caregivers come to see him today.  Visit is to build trust and highlight Palliative Medicine as specialized medical care for people living with serious illness, aimed at facilitating better quality of life through symptoms relief, assisting with advance care plan and establishing goals of care.   CHIEF COMPLAINT: Follow up palliative visit/dysphagia  RECOMMENDATIONS/PLAN:   1. Advance Care Planning/Code Status: Patient remains a DO NOT RESUSCITATE  2. Goals of Care: Goals of care include to maximize quality of life and symptom management.  I spent 16 minutes providing this consultation. More than 50% of the time in this consultation was spent on coordinating communication.  -------------------------------------------------------------------------------------------------------------------------------------------------- 3. Symptom management/Plan:  Dysphagia: recent hospitalization for double pneumonia and aspiration. Aspiration related to dysphagia. Aspiration precautions discussed with [name]- small bites and sips, to sit up at least 30 minutes after meals.  Mechanical soft diet recommended per hospital encounter.  ST following. Palliative will continue to monitor for symptom  management/decline and make recommendations as needed. Return 2 months or prn. Encouraged to call provider sooner with any concerns.   HISTORY OF PRESENT ILLNESS:  [name] [name] is a 80 y.o. male with multiple medical problems including recent hospitalization 1/24-11/07/2020 for multifocal pneumonia and aspiration and associated shortness of breath and acute on chronic hypoxic respiratory failure.  Dysphagia is chronic,  likely related to underlying dementia, worse in the past 2 months resulting in hospitalization for multifocal pneumonia and aspiration.  Patient was treated  With IV antibiotics and discharged home with speech therapy and recommendations for mechanical soft diet. History obtained from review of EMR, discussion with [name]. Review and summarization of Epic records shows history from other than patient. Rest of 10 point ROS asked and negative. Palliative Care was asked to follow this patient by consultation request of Avva, Ravisankar, MD to help address complex decision making in the context of goals of care.   CODE STATUS: DNR  PPS: 30%  HOSPICE ELIGIBILITY/DIAGNOSIS: TBD  PAST MEDICAL HISTORY:  Past Medical History:  Diagnosis Date  . Anemia   . Arthritis   . Cancer ([place])   . CKD (chronic kidney disease)   . COPD (chronic obstructive pulmonary disease) ([place])   . Dementia ([place])   . Diabetes mellitus   . Hyperlipidemia   . Hypertension   . Prostate cancer [facility])     SOCIAL HX: @SOCX  Patient lives at home with [name] for ongoing care   FAMILY HX:  Family History  Problem Relation Age of Onset  . COPD Mother     Review lab tests/diagnostics No results for input(s): WBC, HGB, HCT, PLT, MCV in the last 168 hours. No results for input(s): NA, K, CL, CO2, BUN, CREATININE, GLUCOSE in the last 168 hours. Latest GFR by Cockcroft Gault (not valid in AKI or ESRD) CrCl cannot be calculated (Patient's most recent lab result is older  than the maximum 21 days allowed.). No results  for input(s): AST, ALT, ALKPHOS, GGT in the last 168 hours.  Invalid input(s): TBILI, CONJBILI, ALB, TOTALPROTEIN No components found for: ALB No results for input(s): APTT, INR in the last 168 hours.  Invalid input(s): PTPATIENT No results for input(s): BNP, PROBNP in the last 168 hours.  ALLERGIES:  Allergies  Allergen Reactions  . Oxycodone Itching and Other (See Comments)    Only intolerant when takes with Diphenhydramine, altered mental status      PERTINENT MEDICATIONS:  Outpatient Encounter Medications as of 11/27/2020  Medication Sig  . acetaminophen (TYLENOL) 500 MG tablet Take 500 mg by mouth every 6 (six) hours as needed.  Marland Kitchen albuterol (PROVENTIL) (2.5 MG/3ML) 0.083% nebulizer solution Take 3 mLs (2.5 mg total) by nebulization every 6 (six) hours.  Marland Kitchen aspirin 325 MG tablet Take 325 mg by mouth daily.  Marland Kitchen atorvastatin (LIPITOR) 10 MG tablet Take 10 mg by mouth daily.  . budesonide-formoterol (SYMBICORT) 160-4.5 MCG/ACT inhaler Inhale 2 puffs into the lungs 2 (two) times daily.  . cholecalciferol (VITAMIN D) 1000 UNITS tablet Take 1,000 Units by mouth daily.  . collagenase (SANTYL) ointment Apply 1 application topically daily. Apply to the affected area daily plus dry dressing  . cyanocobalamin 100 MCG tablet Take 1 tablet (100 mcg total) by mouth daily.  . diclofenac Sodium (VOLTAREN) 1 % GEL Apply topically 4 (four) times daily.  . divalproex (DEPAKOTE) 250 MG DR tablet Take 250 mg by mouth 2 (two) times daily.  Marland Kitchen doxycycline (VIBRA-TABS) 100 MG tablet Take 1 tablet (100 mg total) by mouth every 12 (twelve) hours.  . ferrous sulfate 325 (65 FE) MG tablet Take 1 tablet (325 mg total) by mouth daily with breakfast. Take with Calcium (Patient taking differently: Take 325 mg by mouth 2 (two) times daily with a meal. )  . finasteride (PROSCAR) 5 MG tablet Take 5 mg by mouth daily.  Marland Kitchen FLUoxetine (PROZAC) 20 MG capsule Take 60 mg by mouth daily.   . fluticasone furoate-vilanterol  (BREO ELLIPTA) 100-25 MCG/INH AEPB Inhale 1 puff into the lungs daily.  . furosemide (LASIX) 20 MG tablet Take 20 mg by mouth daily.  Marland Kitchen gabapentin (NEURONTIN) 300 MG capsule Take 300 mg by mouth at bedtime.  Marland Kitchen glipiZIDE (GLUCOTROL) 5 MG tablet Take 0.5 tablets (2.5 mg total) by mouth 2 (two) times daily before a meal.  . Glycerin-Hypromellose-PEG 400 (ARTIFICIAL TEARS) 0.2-0.2-1 % SOLN Apply 1-2 drops to eye as needed.  Marland Kitchen lisinopril (ZESTRIL) 20 MG tablet Take by mouth.  Marland Kitchen LORazepam (ATIVAN) 0.5 MG tablet Take by mouth 2 (two) times daily as needed (agitation).  . metFORMIN (GLUCOPHAGE) 1000 MG tablet Take 1,000 mg by mouth 2 (two) times daily with a meal.   . Multiple Vitamin (MULTIVITAMIN WITH MINERALS) TABS tablet Take 1 tablet by mouth daily.  . nicotine (NICODERM CQ - DOSED IN MG/24 HR) 7 mg/24hr patch Place 1 patch (7 mg total) onto the skin daily.  . predniSONE (DELTASONE) 20 MG tablet Take 1 tablet (20 mg total) by mouth daily with breakfast.  . QUEtiapine (SEROQUEL) 25 MG tablet Take 25 mg by mouth 2 (two) times daily.  Marland Kitchen senna-docusate (SENOKOT-S) 8.6-50 MG tablet Take 1 tablet by mouth daily as needed for mild constipation.  Marland Kitchen tiotropium (SPIRIVA) 18 MCG inhalation capsule Place 18 mcg into inhaler and inhale daily.  . vitamin C (ASCORBIC ACID) 500 MG tablet Take 500 mg by mouth daily.   No  facility-administered encounter medications on file as of 11/27/2020.   Physical exam deferred due to telehealth visit.   Thank you for the opportunity to participate in the care of SOPHIE QUILES Please call our office at [phone] if we can be of additional assistance.  Note: Portions of this note were generated with [employer]. Dictation errors may occur despite best attempts at proofreading.  Teodoro Spray, NP

## 2020-11-28 DIAGNOSIS — L89322 Pressure ulcer of left buttock, stage 2: Secondary | ICD-10-CM | POA: Diagnosis not present

## 2020-11-28 DIAGNOSIS — N400 Enlarged prostate without lower urinary tract symptoms: Secondary | ICD-10-CM | POA: Diagnosis not present

## 2020-11-28 DIAGNOSIS — E1122 Type 2 diabetes mellitus with diabetic chronic kidney disease: Secondary | ICD-10-CM | POA: Diagnosis not present

## 2020-11-28 DIAGNOSIS — L89621 Pressure ulcer of left heel, stage 1: Secondary | ICD-10-CM | POA: Diagnosis not present

## 2020-11-28 DIAGNOSIS — N183 Chronic kidney disease, stage 3 unspecified: Secondary | ICD-10-CM | POA: Diagnosis not present

## 2020-11-28 DIAGNOSIS — I130 Hypertensive heart and chronic kidney disease with heart failure and stage 1 through stage 4 chronic kidney disease, or unspecified chronic kidney disease: Secondary | ICD-10-CM | POA: Diagnosis not present

## 2020-11-28 DIAGNOSIS — L89312 Pressure ulcer of right buttock, stage 2: Secondary | ICD-10-CM | POA: Diagnosis not present

## 2020-11-28 DIAGNOSIS — Z466 Encounter for fitting and adjustment of urinary device: Secondary | ICD-10-CM | POA: Diagnosis not present

## 2020-11-28 DIAGNOSIS — I5032 Chronic diastolic (congestive) heart failure: Secondary | ICD-10-CM | POA: Diagnosis not present

## 2020-11-29 DIAGNOSIS — N183 Chronic kidney disease, stage 3 unspecified: Secondary | ICD-10-CM | POA: Diagnosis not present

## 2020-11-29 DIAGNOSIS — E1122 Type 2 diabetes mellitus with diabetic chronic kidney disease: Secondary | ICD-10-CM | POA: Diagnosis not present

## 2020-11-29 DIAGNOSIS — N400 Enlarged prostate without lower urinary tract symptoms: Secondary | ICD-10-CM | POA: Diagnosis not present

## 2020-11-29 DIAGNOSIS — I130 Hypertensive heart and chronic kidney disease with heart failure and stage 1 through stage 4 chronic kidney disease, or unspecified chronic kidney disease: Secondary | ICD-10-CM | POA: Diagnosis not present

## 2020-11-29 DIAGNOSIS — L89621 Pressure ulcer of left heel, stage 1: Secondary | ICD-10-CM | POA: Diagnosis not present

## 2020-11-29 DIAGNOSIS — L89322 Pressure ulcer of left buttock, stage 2: Secondary | ICD-10-CM | POA: Diagnosis not present

## 2020-11-29 DIAGNOSIS — L89312 Pressure ulcer of right buttock, stage 2: Secondary | ICD-10-CM | POA: Diagnosis not present

## 2020-11-29 DIAGNOSIS — Z466 Encounter for fitting and adjustment of urinary device: Secondary | ICD-10-CM | POA: Diagnosis not present

## 2020-11-29 DIAGNOSIS — I5032 Chronic diastolic (congestive) heart failure: Secondary | ICD-10-CM | POA: Diagnosis not present

## 2020-12-02 DIAGNOSIS — N183 Chronic kidney disease, stage 3 unspecified: Secondary | ICD-10-CM | POA: Diagnosis not present

## 2020-12-02 DIAGNOSIS — L89312 Pressure ulcer of right buttock, stage 2: Secondary | ICD-10-CM | POA: Diagnosis not present

## 2020-12-02 DIAGNOSIS — N400 Enlarged prostate without lower urinary tract symptoms: Secondary | ICD-10-CM | POA: Diagnosis not present

## 2020-12-02 DIAGNOSIS — L89322 Pressure ulcer of left buttock, stage 2: Secondary | ICD-10-CM | POA: Diagnosis not present

## 2020-12-02 DIAGNOSIS — I5032 Chronic diastolic (congestive) heart failure: Secondary | ICD-10-CM | POA: Diagnosis not present

## 2020-12-02 DIAGNOSIS — L89621 Pressure ulcer of left heel, stage 1: Secondary | ICD-10-CM | POA: Diagnosis not present

## 2020-12-02 DIAGNOSIS — Z466 Encounter for fitting and adjustment of urinary device: Secondary | ICD-10-CM | POA: Diagnosis not present

## 2020-12-02 DIAGNOSIS — I130 Hypertensive heart and chronic kidney disease with heart failure and stage 1 through stage 4 chronic kidney disease, or unspecified chronic kidney disease: Secondary | ICD-10-CM | POA: Diagnosis not present

## 2020-12-02 DIAGNOSIS — E1122 Type 2 diabetes mellitus with diabetic chronic kidney disease: Secondary | ICD-10-CM | POA: Diagnosis not present

## 2020-12-04 DIAGNOSIS — M4807 Spinal stenosis, lumbosacral region: Secondary | ICD-10-CM | POA: Diagnosis not present

## 2020-12-04 DIAGNOSIS — E1122 Type 2 diabetes mellitus with diabetic chronic kidney disease: Secondary | ICD-10-CM | POA: Diagnosis not present

## 2020-12-04 DIAGNOSIS — J449 Chronic obstructive pulmonary disease, unspecified: Secondary | ICD-10-CM | POA: Diagnosis not present

## 2020-12-04 DIAGNOSIS — J189 Pneumonia, unspecified organism: Secondary | ICD-10-CM | POA: Diagnosis not present

## 2020-12-04 DIAGNOSIS — N1831 Chronic kidney disease, stage 3a: Secondary | ICD-10-CM | POA: Diagnosis not present

## 2020-12-04 DIAGNOSIS — A419 Sepsis, unspecified organism: Secondary | ICD-10-CM | POA: Diagnosis not present

## 2020-12-04 DIAGNOSIS — I129 Hypertensive chronic kidney disease with stage 1 through stage 4 chronic kidney disease, or unspecified chronic kidney disease: Secondary | ICD-10-CM | POA: Diagnosis not present

## 2020-12-04 DIAGNOSIS — L98419 Non-pressure chronic ulcer of buttock with unspecified severity: Secondary | ICD-10-CM | POA: Diagnosis not present

## 2020-12-04 DIAGNOSIS — F0390 Unspecified dementia without behavioral disturbance: Secondary | ICD-10-CM | POA: Diagnosis not present

## 2020-12-05 DIAGNOSIS — N400 Enlarged prostate without lower urinary tract symptoms: Secondary | ICD-10-CM | POA: Diagnosis not present

## 2020-12-05 DIAGNOSIS — L89322 Pressure ulcer of left buttock, stage 2: Secondary | ICD-10-CM | POA: Diagnosis not present

## 2020-12-05 DIAGNOSIS — E1122 Type 2 diabetes mellitus with diabetic chronic kidney disease: Secondary | ICD-10-CM | POA: Diagnosis not present

## 2020-12-05 DIAGNOSIS — L89621 Pressure ulcer of left heel, stage 1: Secondary | ICD-10-CM | POA: Diagnosis not present

## 2020-12-05 DIAGNOSIS — Z466 Encounter for fitting and adjustment of urinary device: Secondary | ICD-10-CM | POA: Diagnosis not present

## 2020-12-05 DIAGNOSIS — N183 Chronic kidney disease, stage 3 unspecified: Secondary | ICD-10-CM | POA: Diagnosis not present

## 2020-12-05 DIAGNOSIS — I130 Hypertensive heart and chronic kidney disease with heart failure and stage 1 through stage 4 chronic kidney disease, or unspecified chronic kidney disease: Secondary | ICD-10-CM | POA: Diagnosis not present

## 2020-12-05 DIAGNOSIS — L89312 Pressure ulcer of right buttock, stage 2: Secondary | ICD-10-CM | POA: Diagnosis not present

## 2020-12-05 DIAGNOSIS — I5032 Chronic diastolic (congestive) heart failure: Secondary | ICD-10-CM | POA: Diagnosis not present

## 2020-12-09 DIAGNOSIS — L89312 Pressure ulcer of right buttock, stage 2: Secondary | ICD-10-CM | POA: Diagnosis not present

## 2020-12-09 DIAGNOSIS — L89322 Pressure ulcer of left buttock, stage 2: Secondary | ICD-10-CM | POA: Diagnosis not present

## 2020-12-09 DIAGNOSIS — E1122 Type 2 diabetes mellitus with diabetic chronic kidney disease: Secondary | ICD-10-CM | POA: Diagnosis not present

## 2020-12-09 DIAGNOSIS — Z466 Encounter for fitting and adjustment of urinary device: Secondary | ICD-10-CM | POA: Diagnosis not present

## 2020-12-09 DIAGNOSIS — I130 Hypertensive heart and chronic kidney disease with heart failure and stage 1 through stage 4 chronic kidney disease, or unspecified chronic kidney disease: Secondary | ICD-10-CM | POA: Diagnosis not present

## 2020-12-09 DIAGNOSIS — N183 Chronic kidney disease, stage 3 unspecified: Secondary | ICD-10-CM | POA: Diagnosis not present

## 2020-12-09 DIAGNOSIS — N400 Enlarged prostate without lower urinary tract symptoms: Secondary | ICD-10-CM | POA: Diagnosis not present

## 2020-12-09 DIAGNOSIS — I5032 Chronic diastolic (congestive) heart failure: Secondary | ICD-10-CM | POA: Diagnosis not present

## 2020-12-09 DIAGNOSIS — L89621 Pressure ulcer of left heel, stage 1: Secondary | ICD-10-CM | POA: Diagnosis not present

## 2020-12-12 DIAGNOSIS — I130 Hypertensive heart and chronic kidney disease with heart failure and stage 1 through stage 4 chronic kidney disease, or unspecified chronic kidney disease: Secondary | ICD-10-CM | POA: Diagnosis not present

## 2020-12-12 DIAGNOSIS — L89312 Pressure ulcer of right buttock, stage 2: Secondary | ICD-10-CM | POA: Diagnosis not present

## 2020-12-12 DIAGNOSIS — Z466 Encounter for fitting and adjustment of urinary device: Secondary | ICD-10-CM | POA: Diagnosis not present

## 2020-12-12 DIAGNOSIS — I5032 Chronic diastolic (congestive) heart failure: Secondary | ICD-10-CM | POA: Diagnosis not present

## 2020-12-12 DIAGNOSIS — L89322 Pressure ulcer of left buttock, stage 2: Secondary | ICD-10-CM | POA: Diagnosis not present

## 2020-12-12 DIAGNOSIS — N183 Chronic kidney disease, stage 3 unspecified: Secondary | ICD-10-CM | POA: Diagnosis not present

## 2020-12-12 DIAGNOSIS — L89621 Pressure ulcer of left heel, stage 1: Secondary | ICD-10-CM | POA: Diagnosis not present

## 2020-12-12 DIAGNOSIS — E1122 Type 2 diabetes mellitus with diabetic chronic kidney disease: Secondary | ICD-10-CM | POA: Diagnosis not present

## 2020-12-12 DIAGNOSIS — N400 Enlarged prostate without lower urinary tract symptoms: Secondary | ICD-10-CM | POA: Diagnosis not present

## 2020-12-17 ENCOUNTER — Ambulatory Visit: Payer: Medicare HMO | Admitting: Neurology

## 2020-12-18 DIAGNOSIS — N400 Enlarged prostate without lower urinary tract symptoms: Secondary | ICD-10-CM | POA: Diagnosis not present

## 2020-12-18 DIAGNOSIS — N183 Chronic kidney disease, stage 3 unspecified: Secondary | ICD-10-CM | POA: Diagnosis not present

## 2020-12-18 DIAGNOSIS — Z466 Encounter for fitting and adjustment of urinary device: Secondary | ICD-10-CM | POA: Diagnosis not present

## 2020-12-18 DIAGNOSIS — L89621 Pressure ulcer of left heel, stage 1: Secondary | ICD-10-CM | POA: Diagnosis not present

## 2020-12-18 DIAGNOSIS — I5032 Chronic diastolic (congestive) heart failure: Secondary | ICD-10-CM | POA: Diagnosis not present

## 2020-12-18 DIAGNOSIS — E1122 Type 2 diabetes mellitus with diabetic chronic kidney disease: Secondary | ICD-10-CM | POA: Diagnosis not present

## 2020-12-18 DIAGNOSIS — I130 Hypertensive heart and chronic kidney disease with heart failure and stage 1 through stage 4 chronic kidney disease, or unspecified chronic kidney disease: Secondary | ICD-10-CM | POA: Diagnosis not present

## 2020-12-18 DIAGNOSIS — L89312 Pressure ulcer of right buttock, stage 2: Secondary | ICD-10-CM | POA: Diagnosis not present

## 2020-12-18 DIAGNOSIS — L89322 Pressure ulcer of left buttock, stage 2: Secondary | ICD-10-CM | POA: Diagnosis not present

## 2020-12-23 DIAGNOSIS — F0390 Unspecified dementia without behavioral disturbance: Secondary | ICD-10-CM | POA: Diagnosis not present

## 2020-12-23 DIAGNOSIS — L98419 Non-pressure chronic ulcer of buttock with unspecified severity: Secondary | ICD-10-CM | POA: Diagnosis not present

## 2020-12-23 DIAGNOSIS — I129 Hypertensive chronic kidney disease with stage 1 through stage 4 chronic kidney disease, or unspecified chronic kidney disease: Secondary | ICD-10-CM | POA: Diagnosis not present

## 2020-12-23 DIAGNOSIS — D509 Iron deficiency anemia, unspecified: Secondary | ICD-10-CM | POA: Diagnosis not present

## 2020-12-23 DIAGNOSIS — J449 Chronic obstructive pulmonary disease, unspecified: Secondary | ICD-10-CM | POA: Diagnosis not present

## 2020-12-23 DIAGNOSIS — M4807 Spinal stenosis, lumbosacral region: Secondary | ICD-10-CM | POA: Diagnosis not present

## 2020-12-23 DIAGNOSIS — R1310 Dysphagia, unspecified: Secondary | ICD-10-CM | POA: Diagnosis not present

## 2020-12-23 DIAGNOSIS — E1122 Type 2 diabetes mellitus with diabetic chronic kidney disease: Secondary | ICD-10-CM | POA: Diagnosis not present

## 2020-12-23 DIAGNOSIS — N1831 Chronic kidney disease, stage 3a: Secondary | ICD-10-CM | POA: Diagnosis not present

## 2020-12-27 DIAGNOSIS — Z7401 Bed confinement status: Secondary | ICD-10-CM | POA: Diagnosis not present

## 2020-12-27 DIAGNOSIS — R531 Weakness: Secondary | ICD-10-CM | POA: Diagnosis not present

## 2021-01-01 DIAGNOSIS — Z7401 Bed confinement status: Secondary | ICD-10-CM | POA: Diagnosis not present

## 2021-01-01 DIAGNOSIS — I959 Hypotension, unspecified: Secondary | ICD-10-CM | POA: Diagnosis not present

## 2021-01-04 DIAGNOSIS — R4020 Unspecified coma: Secondary | ICD-10-CM | POA: Diagnosis not present

## 2021-01-04 DIAGNOSIS — E162 Hypoglycemia, unspecified: Secondary | ICD-10-CM | POA: Diagnosis not present

## 2021-01-04 DIAGNOSIS — L89619 Pressure ulcer of right heel, unspecified stage: Secondary | ICD-10-CM | POA: Diagnosis not present

## 2021-01-04 DIAGNOSIS — T17908A Unspecified foreign body in respiratory tract, part unspecified causing other injury, initial encounter: Secondary | ICD-10-CM | POA: Diagnosis not present

## 2021-01-04 DIAGNOSIS — E11649 Type 2 diabetes mellitus with hypoglycemia without coma: Secondary | ICD-10-CM | POA: Diagnosis not present

## 2021-01-04 DIAGNOSIS — E161 Other hypoglycemia: Secondary | ICD-10-CM | POA: Diagnosis not present

## 2021-01-04 DIAGNOSIS — R9431 Abnormal electrocardiogram [ECG] [EKG]: Secondary | ICD-10-CM | POA: Diagnosis not present

## 2021-01-04 DIAGNOSIS — R404 Transient alteration of awareness: Secondary | ICD-10-CM | POA: Diagnosis not present

## 2021-01-04 DIAGNOSIS — N3090 Cystitis, unspecified without hematuria: Secondary | ICD-10-CM | POA: Diagnosis not present

## 2021-01-04 DIAGNOSIS — J690 Pneumonitis due to inhalation of food and vomit: Secondary | ICD-10-CM | POA: Diagnosis not present

## 2021-01-04 DIAGNOSIS — L89629 Pressure ulcer of left heel, unspecified stage: Secondary | ICD-10-CM | POA: Diagnosis not present

## 2021-01-04 DIAGNOSIS — J449 Chronic obstructive pulmonary disease, unspecified: Secondary | ICD-10-CM | POA: Diagnosis not present

## 2021-01-04 DIAGNOSIS — G9341 Metabolic encephalopathy: Secondary | ICD-10-CM | POA: Diagnosis not present

## 2021-01-04 DIAGNOSIS — I959 Hypotension, unspecified: Secondary | ICD-10-CM | POA: Diagnosis not present

## 2021-01-04 DIAGNOSIS — R32 Unspecified urinary incontinence: Secondary | ICD-10-CM | POA: Diagnosis not present

## 2021-01-04 DIAGNOSIS — Z9981 Dependence on supplemental oxygen: Secondary | ICD-10-CM | POA: Diagnosis not present

## 2021-01-04 DIAGNOSIS — R1319 Other dysphagia: Secondary | ICD-10-CM | POA: Diagnosis not present

## 2021-01-04 DIAGNOSIS — J439 Emphysema, unspecified: Secondary | ICD-10-CM | POA: Diagnosis not present

## 2021-01-04 DIAGNOSIS — R42 Dizziness and giddiness: Secondary | ICD-10-CM | POA: Diagnosis not present

## 2021-01-04 DIAGNOSIS — F0390 Unspecified dementia without behavioral disturbance: Secondary | ICD-10-CM | POA: Diagnosis not present

## 2021-01-04 DIAGNOSIS — R059 Cough, unspecified: Secondary | ICD-10-CM | POA: Diagnosis not present

## 2021-01-05 DIAGNOSIS — E785 Hyperlipidemia, unspecified: Secondary | ICD-10-CM | POA: Diagnosis not present

## 2021-01-05 DIAGNOSIS — Z7984 Long term (current) use of oral hypoglycemic drugs: Secondary | ICD-10-CM | POA: Diagnosis not present

## 2021-01-05 DIAGNOSIS — E119 Type 2 diabetes mellitus without complications: Secondary | ICD-10-CM | POA: Diagnosis not present

## 2021-01-05 DIAGNOSIS — Z9981 Dependence on supplemental oxygen: Secondary | ICD-10-CM | POA: Diagnosis not present

## 2021-01-05 DIAGNOSIS — E162 Hypoglycemia, unspecified: Secondary | ICD-10-CM | POA: Diagnosis not present

## 2021-01-05 DIAGNOSIS — G9341 Metabolic encephalopathy: Secondary | ICD-10-CM | POA: Diagnosis not present

## 2021-01-05 DIAGNOSIS — I110 Hypertensive heart disease with heart failure: Secondary | ICD-10-CM | POA: Diagnosis not present

## 2021-01-05 DIAGNOSIS — I5030 Unspecified diastolic (congestive) heart failure: Secondary | ICD-10-CM | POA: Diagnosis not present

## 2021-01-05 DIAGNOSIS — J449 Chronic obstructive pulmonary disease, unspecified: Secondary | ICD-10-CM | POA: Diagnosis not present

## 2021-01-06 DIAGNOSIS — E162 Hypoglycemia, unspecified: Secondary | ICD-10-CM | POA: Diagnosis not present

## 2021-01-06 DIAGNOSIS — J449 Chronic obstructive pulmonary disease, unspecified: Secondary | ICD-10-CM | POA: Diagnosis not present

## 2021-01-06 DIAGNOSIS — E119 Type 2 diabetes mellitus without complications: Secondary | ICD-10-CM | POA: Diagnosis not present

## 2021-01-06 DIAGNOSIS — E785 Hyperlipidemia, unspecified: Secondary | ICD-10-CM | POA: Diagnosis not present

## 2021-01-06 DIAGNOSIS — G9341 Metabolic encephalopathy: Secondary | ICD-10-CM | POA: Diagnosis not present

## 2021-01-06 DIAGNOSIS — Z9981 Dependence on supplemental oxygen: Secondary | ICD-10-CM | POA: Diagnosis not present

## 2021-01-06 DIAGNOSIS — I5030 Unspecified diastolic (congestive) heart failure: Secondary | ICD-10-CM | POA: Diagnosis not present

## 2021-01-06 DIAGNOSIS — Z7984 Long term (current) use of oral hypoglycemic drugs: Secondary | ICD-10-CM | POA: Diagnosis not present

## 2021-01-06 DIAGNOSIS — I110 Hypertensive heart disease with heart failure: Secondary | ICD-10-CM | POA: Diagnosis not present

## 2021-01-07 DIAGNOSIS — R279 Unspecified lack of coordination: Secondary | ICD-10-CM | POA: Diagnosis not present

## 2021-01-07 DIAGNOSIS — E11649 Type 2 diabetes mellitus with hypoglycemia without coma: Secondary | ICD-10-CM | POA: Diagnosis not present

## 2021-01-07 DIAGNOSIS — Z743 Need for continuous supervision: Secondary | ICD-10-CM | POA: Diagnosis not present

## 2021-01-07 DIAGNOSIS — E162 Hypoglycemia, unspecified: Secondary | ICD-10-CM | POA: Diagnosis not present

## 2021-01-07 DIAGNOSIS — I959 Hypotension, unspecified: Secondary | ICD-10-CM | POA: Diagnosis not present

## 2021-01-07 DIAGNOSIS — N3090 Cystitis, unspecified without hematuria: Secondary | ICD-10-CM | POA: Diagnosis not present

## 2021-01-07 DIAGNOSIS — G9341 Metabolic encephalopathy: Secondary | ICD-10-CM | POA: Diagnosis not present

## 2021-01-07 DIAGNOSIS — E161 Other hypoglycemia: Secondary | ICD-10-CM | POA: Diagnosis not present

## 2021-01-07 DIAGNOSIS — J690 Pneumonitis due to inhalation of food and vomit: Secondary | ICD-10-CM | POA: Diagnosis not present

## 2021-01-29 DIAGNOSIS — R531 Weakness: Secondary | ICD-10-CM | POA: Diagnosis not present

## 2021-01-29 DIAGNOSIS — Z7401 Bed confinement status: Secondary | ICD-10-CM | POA: Diagnosis not present

## 2021-02-06 DIAGNOSIS — E724 Disorders of ornithine metabolism: Secondary | ICD-10-CM | POA: Diagnosis not present

## 2021-02-06 DIAGNOSIS — Z515 Encounter for palliative care: Secondary | ICD-10-CM | POA: Diagnosis not present

## 2021-02-06 DIAGNOSIS — F05 Delirium due to known physiological condition: Secondary | ICD-10-CM | POA: Diagnosis not present

## 2021-02-06 DIAGNOSIS — I5032 Chronic diastolic (congestive) heart failure: Secondary | ICD-10-CM | POA: Diagnosis not present

## 2021-02-06 DIAGNOSIS — J189 Pneumonia, unspecified organism: Secondary | ICD-10-CM | POA: Diagnosis not present

## 2021-02-06 DIAGNOSIS — I130 Hypertensive heart and chronic kidney disease with heart failure and stage 1 through stage 4 chronic kidney disease, or unspecified chronic kidney disease: Secondary | ICD-10-CM | POA: Diagnosis not present

## 2021-02-06 DIAGNOSIS — R1310 Dysphagia, unspecified: Secondary | ICD-10-CM | POA: Diagnosis not present

## 2021-02-06 DIAGNOSIS — F0390 Unspecified dementia without behavioral disturbance: Secondary | ICD-10-CM | POA: Diagnosis not present

## 2021-02-06 DIAGNOSIS — D649 Anemia, unspecified: Secondary | ICD-10-CM | POA: Diagnosis not present

## 2021-02-06 DIAGNOSIS — J159 Unspecified bacterial pneumonia: Secondary | ICD-10-CM | POA: Diagnosis not present

## 2021-02-06 DIAGNOSIS — R279 Unspecified lack of coordination: Secondary | ICD-10-CM | POA: Diagnosis not present

## 2021-02-06 DIAGNOSIS — T402X1A Poisoning by other opioids, accidental (unintentional), initial encounter: Secondary | ICD-10-CM | POA: Diagnosis not present

## 2021-02-06 DIAGNOSIS — R339 Retention of urine, unspecified: Secondary | ICD-10-CM | POA: Diagnosis not present

## 2021-02-06 DIAGNOSIS — R410 Disorientation, unspecified: Secondary | ICD-10-CM | POA: Diagnosis not present

## 2021-02-06 DIAGNOSIS — G9341 Metabolic encephalopathy: Secondary | ICD-10-CM | POA: Diagnosis not present

## 2021-02-06 DIAGNOSIS — L89623 Pressure ulcer of left heel, stage 3: Secondary | ICD-10-CM | POA: Diagnosis not present

## 2021-02-06 DIAGNOSIS — R451 Restlessness and agitation: Secondary | ICD-10-CM | POA: Diagnosis not present

## 2021-02-06 DIAGNOSIS — R4182 Altered mental status, unspecified: Secondary | ICD-10-CM | POA: Diagnosis not present

## 2021-02-06 DIAGNOSIS — E7220 Disorder of urea cycle metabolism, unspecified: Secondary | ICD-10-CM | POA: Diagnosis not present

## 2021-02-06 DIAGNOSIS — J440 Chronic obstructive pulmonary disease with acute lower respiratory infection: Secondary | ICD-10-CM | POA: Diagnosis not present

## 2021-02-06 DIAGNOSIS — I959 Hypotension, unspecified: Secondary | ICD-10-CM | POA: Diagnosis not present

## 2021-02-06 DIAGNOSIS — J449 Chronic obstructive pulmonary disease, unspecified: Secondary | ICD-10-CM | POA: Diagnosis not present

## 2021-02-06 DIAGNOSIS — Z9981 Dependence on supplemental oxygen: Secondary | ICD-10-CM | POA: Diagnosis not present

## 2021-02-06 DIAGNOSIS — Z743 Need for continuous supervision: Secondary | ICD-10-CM | POA: Diagnosis not present

## 2021-02-06 DIAGNOSIS — F29 Unspecified psychosis not due to a substance or known physiological condition: Secondary | ICD-10-CM | POA: Diagnosis not present

## 2021-02-17 DIAGNOSIS — S30810D Abrasion of lower back and pelvis, subsequent encounter: Secondary | ICD-10-CM | POA: Diagnosis not present

## 2021-02-17 DIAGNOSIS — L89616 Pressure-induced deep tissue damage of right heel: Secondary | ICD-10-CM | POA: Diagnosis not present

## 2021-02-17 DIAGNOSIS — I130 Hypertensive heart and chronic kidney disease with heart failure and stage 1 through stage 4 chronic kidney disease, or unspecified chronic kidney disease: Secondary | ICD-10-CM | POA: Diagnosis not present

## 2021-02-17 DIAGNOSIS — E1122 Type 2 diabetes mellitus with diabetic chronic kidney disease: Secondary | ICD-10-CM | POA: Diagnosis not present

## 2021-02-17 DIAGNOSIS — Z466 Encounter for fitting and adjustment of urinary device: Secondary | ICD-10-CM | POA: Diagnosis not present

## 2021-02-17 DIAGNOSIS — L89151 Pressure ulcer of sacral region, stage 1: Secondary | ICD-10-CM | POA: Diagnosis not present

## 2021-02-17 DIAGNOSIS — I5032 Chronic diastolic (congestive) heart failure: Secondary | ICD-10-CM | POA: Diagnosis not present

## 2021-02-17 DIAGNOSIS — L89896 Pressure-induced deep tissue damage of other site: Secondary | ICD-10-CM | POA: Diagnosis not present

## 2021-02-17 DIAGNOSIS — L89626 Pressure-induced deep tissue damage of left heel: Secondary | ICD-10-CM | POA: Diagnosis not present

## 2021-02-19 DIAGNOSIS — I5032 Chronic diastolic (congestive) heart failure: Secondary | ICD-10-CM | POA: Diagnosis not present

## 2021-02-19 DIAGNOSIS — Z466 Encounter for fitting and adjustment of urinary device: Secondary | ICD-10-CM | POA: Diagnosis not present

## 2021-02-19 DIAGNOSIS — L89616 Pressure-induced deep tissue damage of right heel: Secondary | ICD-10-CM | POA: Diagnosis not present

## 2021-02-19 DIAGNOSIS — L89896 Pressure-induced deep tissue damage of other site: Secondary | ICD-10-CM | POA: Diagnosis not present

## 2021-02-19 DIAGNOSIS — S30810D Abrasion of lower back and pelvis, subsequent encounter: Secondary | ICD-10-CM | POA: Diagnosis not present

## 2021-02-19 DIAGNOSIS — L89626 Pressure-induced deep tissue damage of left heel: Secondary | ICD-10-CM | POA: Diagnosis not present

## 2021-02-19 DIAGNOSIS — E1122 Type 2 diabetes mellitus with diabetic chronic kidney disease: Secondary | ICD-10-CM | POA: Diagnosis not present

## 2021-02-19 DIAGNOSIS — I130 Hypertensive heart and chronic kidney disease with heart failure and stage 1 through stage 4 chronic kidney disease, or unspecified chronic kidney disease: Secondary | ICD-10-CM | POA: Diagnosis not present

## 2021-02-19 DIAGNOSIS — L89151 Pressure ulcer of sacral region, stage 1: Secondary | ICD-10-CM | POA: Diagnosis not present

## 2021-02-20 DIAGNOSIS — L89151 Pressure ulcer of sacral region, stage 1: Secondary | ICD-10-CM | POA: Diagnosis not present

## 2021-02-20 DIAGNOSIS — I130 Hypertensive heart and chronic kidney disease with heart failure and stage 1 through stage 4 chronic kidney disease, or unspecified chronic kidney disease: Secondary | ICD-10-CM | POA: Diagnosis not present

## 2021-02-20 DIAGNOSIS — E1122 Type 2 diabetes mellitus with diabetic chronic kidney disease: Secondary | ICD-10-CM | POA: Diagnosis not present

## 2021-02-20 DIAGNOSIS — S30810D Abrasion of lower back and pelvis, subsequent encounter: Secondary | ICD-10-CM | POA: Diagnosis not present

## 2021-02-20 DIAGNOSIS — L89626 Pressure-induced deep tissue damage of left heel: Secondary | ICD-10-CM | POA: Diagnosis not present

## 2021-02-20 DIAGNOSIS — Z466 Encounter for fitting and adjustment of urinary device: Secondary | ICD-10-CM | POA: Diagnosis not present

## 2021-02-20 DIAGNOSIS — L89896 Pressure-induced deep tissue damage of other site: Secondary | ICD-10-CM | POA: Diagnosis not present

## 2021-02-20 DIAGNOSIS — L89616 Pressure-induced deep tissue damage of right heel: Secondary | ICD-10-CM | POA: Diagnosis not present

## 2021-02-20 DIAGNOSIS — I5032 Chronic diastolic (congestive) heart failure: Secondary | ICD-10-CM | POA: Diagnosis not present

## 2021-02-21 DIAGNOSIS — J449 Chronic obstructive pulmonary disease, unspecified: Secondary | ICD-10-CM | POA: Diagnosis not present

## 2021-02-21 DIAGNOSIS — Z66 Do not resuscitate: Secondary | ICD-10-CM | POA: Diagnosis not present

## 2021-02-21 DIAGNOSIS — Z741 Need for assistance with personal care: Secondary | ICD-10-CM | POA: Diagnosis not present

## 2021-02-21 DIAGNOSIS — I129 Hypertensive chronic kidney disease with stage 1 through stage 4 chronic kidney disease, or unspecified chronic kidney disease: Secondary | ICD-10-CM | POA: Diagnosis not present

## 2021-02-21 DIAGNOSIS — Z9981 Dependence on supplemental oxygen: Secondary | ICD-10-CM | POA: Diagnosis not present

## 2021-02-21 DIAGNOSIS — Z7401 Bed confinement status: Secondary | ICD-10-CM | POA: Diagnosis not present

## 2021-02-21 DIAGNOSIS — N1831 Chronic kidney disease, stage 3a: Secondary | ICD-10-CM | POA: Diagnosis not present

## 2021-02-21 DIAGNOSIS — F0391 Unspecified dementia with behavioral disturbance: Secondary | ICD-10-CM | POA: Diagnosis not present

## 2021-02-24 DIAGNOSIS — L89626 Pressure-induced deep tissue damage of left heel: Secondary | ICD-10-CM | POA: Diagnosis not present

## 2021-02-24 DIAGNOSIS — L89616 Pressure-induced deep tissue damage of right heel: Secondary | ICD-10-CM | POA: Diagnosis not present

## 2021-02-24 DIAGNOSIS — I5032 Chronic diastolic (congestive) heart failure: Secondary | ICD-10-CM | POA: Diagnosis not present

## 2021-02-24 DIAGNOSIS — E1122 Type 2 diabetes mellitus with diabetic chronic kidney disease: Secondary | ICD-10-CM | POA: Diagnosis not present

## 2021-02-24 DIAGNOSIS — L89151 Pressure ulcer of sacral region, stage 1: Secondary | ICD-10-CM | POA: Diagnosis not present

## 2021-02-24 DIAGNOSIS — I130 Hypertensive heart and chronic kidney disease with heart failure and stage 1 through stage 4 chronic kidney disease, or unspecified chronic kidney disease: Secondary | ICD-10-CM | POA: Diagnosis not present

## 2021-02-24 DIAGNOSIS — S30810D Abrasion of lower back and pelvis, subsequent encounter: Secondary | ICD-10-CM | POA: Diagnosis not present

## 2021-02-24 DIAGNOSIS — Z466 Encounter for fitting and adjustment of urinary device: Secondary | ICD-10-CM | POA: Diagnosis not present

## 2021-02-24 DIAGNOSIS — L89896 Pressure-induced deep tissue damage of other site: Secondary | ICD-10-CM | POA: Diagnosis not present

## 2021-02-27 DIAGNOSIS — S30810D Abrasion of lower back and pelvis, subsequent encounter: Secondary | ICD-10-CM | POA: Diagnosis not present

## 2021-02-27 DIAGNOSIS — I130 Hypertensive heart and chronic kidney disease with heart failure and stage 1 through stage 4 chronic kidney disease, or unspecified chronic kidney disease: Secondary | ICD-10-CM | POA: Diagnosis not present

## 2021-02-27 DIAGNOSIS — L89616 Pressure-induced deep tissue damage of right heel: Secondary | ICD-10-CM | POA: Diagnosis not present

## 2021-02-27 DIAGNOSIS — L89626 Pressure-induced deep tissue damage of left heel: Secondary | ICD-10-CM | POA: Diagnosis not present

## 2021-02-27 DIAGNOSIS — E1122 Type 2 diabetes mellitus with diabetic chronic kidney disease: Secondary | ICD-10-CM | POA: Diagnosis not present

## 2021-02-27 DIAGNOSIS — Z466 Encounter for fitting and adjustment of urinary device: Secondary | ICD-10-CM | POA: Diagnosis not present

## 2021-02-27 DIAGNOSIS — I5032 Chronic diastolic (congestive) heart failure: Secondary | ICD-10-CM | POA: Diagnosis not present

## 2021-02-27 DIAGNOSIS — L89896 Pressure-induced deep tissue damage of other site: Secondary | ICD-10-CM | POA: Diagnosis not present

## 2021-02-27 DIAGNOSIS — L89151 Pressure ulcer of sacral region, stage 1: Secondary | ICD-10-CM | POA: Diagnosis not present

## 2021-03-03 DIAGNOSIS — L89616 Pressure-induced deep tissue damage of right heel: Secondary | ICD-10-CM | POA: Diagnosis not present

## 2021-03-03 DIAGNOSIS — S30810D Abrasion of lower back and pelvis, subsequent encounter: Secondary | ICD-10-CM | POA: Diagnosis not present

## 2021-03-03 DIAGNOSIS — L89151 Pressure ulcer of sacral region, stage 1: Secondary | ICD-10-CM | POA: Diagnosis not present

## 2021-03-03 DIAGNOSIS — I5032 Chronic diastolic (congestive) heart failure: Secondary | ICD-10-CM | POA: Diagnosis not present

## 2021-03-03 DIAGNOSIS — L89626 Pressure-induced deep tissue damage of left heel: Secondary | ICD-10-CM | POA: Diagnosis not present

## 2021-03-03 DIAGNOSIS — Z466 Encounter for fitting and adjustment of urinary device: Secondary | ICD-10-CM | POA: Diagnosis not present

## 2021-03-03 DIAGNOSIS — E1122 Type 2 diabetes mellitus with diabetic chronic kidney disease: Secondary | ICD-10-CM | POA: Diagnosis not present

## 2021-03-03 DIAGNOSIS — L89896 Pressure-induced deep tissue damage of other site: Secondary | ICD-10-CM | POA: Diagnosis not present

## 2021-03-03 DIAGNOSIS — I130 Hypertensive heart and chronic kidney disease with heart failure and stage 1 through stage 4 chronic kidney disease, or unspecified chronic kidney disease: Secondary | ICD-10-CM | POA: Diagnosis not present

## 2021-03-06 DIAGNOSIS — S30810D Abrasion of lower back and pelvis, subsequent encounter: Secondary | ICD-10-CM | POA: Diagnosis not present

## 2021-03-06 DIAGNOSIS — I130 Hypertensive heart and chronic kidney disease with heart failure and stage 1 through stage 4 chronic kidney disease, or unspecified chronic kidney disease: Secondary | ICD-10-CM | POA: Diagnosis not present

## 2021-03-06 DIAGNOSIS — I5032 Chronic diastolic (congestive) heart failure: Secondary | ICD-10-CM | POA: Diagnosis not present

## 2021-03-06 DIAGNOSIS — L89616 Pressure-induced deep tissue damage of right heel: Secondary | ICD-10-CM | POA: Diagnosis not present

## 2021-03-06 DIAGNOSIS — Z466 Encounter for fitting and adjustment of urinary device: Secondary | ICD-10-CM | POA: Diagnosis not present

## 2021-03-06 DIAGNOSIS — E1122 Type 2 diabetes mellitus with diabetic chronic kidney disease: Secondary | ICD-10-CM | POA: Diagnosis not present

## 2021-03-06 DIAGNOSIS — L89626 Pressure-induced deep tissue damage of left heel: Secondary | ICD-10-CM | POA: Diagnosis not present

## 2021-03-06 DIAGNOSIS — L89896 Pressure-induced deep tissue damage of other site: Secondary | ICD-10-CM | POA: Diagnosis not present

## 2021-03-06 DIAGNOSIS — L89151 Pressure ulcer of sacral region, stage 1: Secondary | ICD-10-CM | POA: Diagnosis not present

## 2021-03-10 DIAGNOSIS — I130 Hypertensive heart and chronic kidney disease with heart failure and stage 1 through stage 4 chronic kidney disease, or unspecified chronic kidney disease: Secondary | ICD-10-CM | POA: Diagnosis not present

## 2021-03-10 DIAGNOSIS — L89151 Pressure ulcer of sacral region, stage 1: Secondary | ICD-10-CM | POA: Diagnosis not present

## 2021-03-10 DIAGNOSIS — E1122 Type 2 diabetes mellitus with diabetic chronic kidney disease: Secondary | ICD-10-CM | POA: Diagnosis not present

## 2021-03-10 DIAGNOSIS — L89896 Pressure-induced deep tissue damage of other site: Secondary | ICD-10-CM | POA: Diagnosis not present

## 2021-03-10 DIAGNOSIS — I5032 Chronic diastolic (congestive) heart failure: Secondary | ICD-10-CM | POA: Diagnosis not present

## 2021-03-10 DIAGNOSIS — L89616 Pressure-induced deep tissue damage of right heel: Secondary | ICD-10-CM | POA: Diagnosis not present

## 2021-03-10 DIAGNOSIS — Z466 Encounter for fitting and adjustment of urinary device: Secondary | ICD-10-CM | POA: Diagnosis not present

## 2021-03-10 DIAGNOSIS — S30810D Abrasion of lower back and pelvis, subsequent encounter: Secondary | ICD-10-CM | POA: Diagnosis not present

## 2021-03-10 DIAGNOSIS — L89626 Pressure-induced deep tissue damage of left heel: Secondary | ICD-10-CM | POA: Diagnosis not present

## 2021-03-13 DIAGNOSIS — L89896 Pressure-induced deep tissue damage of other site: Secondary | ICD-10-CM | POA: Diagnosis not present

## 2021-03-13 DIAGNOSIS — E1122 Type 2 diabetes mellitus with diabetic chronic kidney disease: Secondary | ICD-10-CM | POA: Diagnosis not present

## 2021-03-13 DIAGNOSIS — I5032 Chronic diastolic (congestive) heart failure: Secondary | ICD-10-CM | POA: Diagnosis not present

## 2021-03-13 DIAGNOSIS — L89151 Pressure ulcer of sacral region, stage 1: Secondary | ICD-10-CM | POA: Diagnosis not present

## 2021-03-13 DIAGNOSIS — S30810D Abrasion of lower back and pelvis, subsequent encounter: Secondary | ICD-10-CM | POA: Diagnosis not present

## 2021-03-13 DIAGNOSIS — I130 Hypertensive heart and chronic kidney disease with heart failure and stage 1 through stage 4 chronic kidney disease, or unspecified chronic kidney disease: Secondary | ICD-10-CM | POA: Diagnosis not present

## 2021-03-13 DIAGNOSIS — L89616 Pressure-induced deep tissue damage of right heel: Secondary | ICD-10-CM | POA: Diagnosis not present

## 2021-03-13 DIAGNOSIS — Z466 Encounter for fitting and adjustment of urinary device: Secondary | ICD-10-CM | POA: Diagnosis not present

## 2021-03-13 DIAGNOSIS — L89626 Pressure-induced deep tissue damage of left heel: Secondary | ICD-10-CM | POA: Diagnosis not present

## 2021-03-18 DIAGNOSIS — L89896 Pressure-induced deep tissue damage of other site: Secondary | ICD-10-CM | POA: Diagnosis not present

## 2021-03-18 DIAGNOSIS — L89151 Pressure ulcer of sacral region, stage 1: Secondary | ICD-10-CM | POA: Diagnosis not present

## 2021-03-18 DIAGNOSIS — S30810D Abrasion of lower back and pelvis, subsequent encounter: Secondary | ICD-10-CM | POA: Diagnosis not present

## 2021-03-18 DIAGNOSIS — I5032 Chronic diastolic (congestive) heart failure: Secondary | ICD-10-CM | POA: Diagnosis not present

## 2021-03-18 DIAGNOSIS — E1122 Type 2 diabetes mellitus with diabetic chronic kidney disease: Secondary | ICD-10-CM | POA: Diagnosis not present

## 2021-03-18 DIAGNOSIS — I130 Hypertensive heart and chronic kidney disease with heart failure and stage 1 through stage 4 chronic kidney disease, or unspecified chronic kidney disease: Secondary | ICD-10-CM | POA: Diagnosis not present

## 2021-03-18 DIAGNOSIS — L89626 Pressure-induced deep tissue damage of left heel: Secondary | ICD-10-CM | POA: Diagnosis not present

## 2021-03-18 DIAGNOSIS — L89616 Pressure-induced deep tissue damage of right heel: Secondary | ICD-10-CM | POA: Diagnosis not present

## 2021-03-18 DIAGNOSIS — Z466 Encounter for fitting and adjustment of urinary device: Secondary | ICD-10-CM | POA: Diagnosis not present

## 2021-03-20 DIAGNOSIS — I5032 Chronic diastolic (congestive) heart failure: Secondary | ICD-10-CM | POA: Diagnosis not present

## 2021-03-20 DIAGNOSIS — Z466 Encounter for fitting and adjustment of urinary device: Secondary | ICD-10-CM | POA: Diagnosis not present

## 2021-03-20 DIAGNOSIS — E1122 Type 2 diabetes mellitus with diabetic chronic kidney disease: Secondary | ICD-10-CM | POA: Diagnosis not present

## 2021-03-20 DIAGNOSIS — L89626 Pressure-induced deep tissue damage of left heel: Secondary | ICD-10-CM | POA: Diagnosis not present

## 2021-03-20 DIAGNOSIS — L89616 Pressure-induced deep tissue damage of right heel: Secondary | ICD-10-CM | POA: Diagnosis not present

## 2021-03-20 DIAGNOSIS — L89896 Pressure-induced deep tissue damage of other site: Secondary | ICD-10-CM | POA: Diagnosis not present

## 2021-03-20 DIAGNOSIS — L89151 Pressure ulcer of sacral region, stage 1: Secondary | ICD-10-CM | POA: Diagnosis not present

## 2021-03-20 DIAGNOSIS — S30810D Abrasion of lower back and pelvis, subsequent encounter: Secondary | ICD-10-CM | POA: Diagnosis not present

## 2021-03-20 DIAGNOSIS — I130 Hypertensive heart and chronic kidney disease with heart failure and stage 1 through stage 4 chronic kidney disease, or unspecified chronic kidney disease: Secondary | ICD-10-CM | POA: Diagnosis not present

## 2021-03-25 DIAGNOSIS — L89626 Pressure-induced deep tissue damage of left heel: Secondary | ICD-10-CM | POA: Diagnosis not present

## 2021-03-25 DIAGNOSIS — S30810D Abrasion of lower back and pelvis, subsequent encounter: Secondary | ICD-10-CM | POA: Diagnosis not present

## 2021-03-25 DIAGNOSIS — I130 Hypertensive heart and chronic kidney disease with heart failure and stage 1 through stage 4 chronic kidney disease, or unspecified chronic kidney disease: Secondary | ICD-10-CM | POA: Diagnosis not present

## 2021-03-25 DIAGNOSIS — L89896 Pressure-induced deep tissue damage of other site: Secondary | ICD-10-CM | POA: Diagnosis not present

## 2021-03-25 DIAGNOSIS — I5032 Chronic diastolic (congestive) heart failure: Secondary | ICD-10-CM | POA: Diagnosis not present

## 2021-03-25 DIAGNOSIS — L89616 Pressure-induced deep tissue damage of right heel: Secondary | ICD-10-CM | POA: Diagnosis not present

## 2021-03-25 DIAGNOSIS — L89151 Pressure ulcer of sacral region, stage 1: Secondary | ICD-10-CM | POA: Diagnosis not present

## 2021-03-25 DIAGNOSIS — E1122 Type 2 diabetes mellitus with diabetic chronic kidney disease: Secondary | ICD-10-CM | POA: Diagnosis not present

## 2021-03-25 DIAGNOSIS — Z466 Encounter for fitting and adjustment of urinary device: Secondary | ICD-10-CM | POA: Diagnosis not present

## 2021-03-26 DIAGNOSIS — M79642 Pain in left hand: Secondary | ICD-10-CM | POA: Diagnosis not present

## 2021-03-26 DIAGNOSIS — R2232 Localized swelling, mass and lump, left upper limb: Secondary | ICD-10-CM | POA: Diagnosis not present

## 2021-03-27 DIAGNOSIS — L89626 Pressure-induced deep tissue damage of left heel: Secondary | ICD-10-CM | POA: Diagnosis not present

## 2021-03-27 DIAGNOSIS — I130 Hypertensive heart and chronic kidney disease with heart failure and stage 1 through stage 4 chronic kidney disease, or unspecified chronic kidney disease: Secondary | ICD-10-CM | POA: Diagnosis not present

## 2021-03-27 DIAGNOSIS — L89616 Pressure-induced deep tissue damage of right heel: Secondary | ICD-10-CM | POA: Diagnosis not present

## 2021-03-27 DIAGNOSIS — L89896 Pressure-induced deep tissue damage of other site: Secondary | ICD-10-CM | POA: Diagnosis not present

## 2021-03-27 DIAGNOSIS — Z466 Encounter for fitting and adjustment of urinary device: Secondary | ICD-10-CM | POA: Diagnosis not present

## 2021-03-27 DIAGNOSIS — I5032 Chronic diastolic (congestive) heart failure: Secondary | ICD-10-CM | POA: Diagnosis not present

## 2021-03-27 DIAGNOSIS — L89151 Pressure ulcer of sacral region, stage 1: Secondary | ICD-10-CM | POA: Diagnosis not present

## 2021-03-27 DIAGNOSIS — E1122 Type 2 diabetes mellitus with diabetic chronic kidney disease: Secondary | ICD-10-CM | POA: Diagnosis not present

## 2021-03-27 DIAGNOSIS — S30810D Abrasion of lower back and pelvis, subsequent encounter: Secondary | ICD-10-CM | POA: Diagnosis not present

## 2021-03-31 DIAGNOSIS — E1122 Type 2 diabetes mellitus with diabetic chronic kidney disease: Secondary | ICD-10-CM | POA: Diagnosis not present

## 2021-03-31 DIAGNOSIS — S30810D Abrasion of lower back and pelvis, subsequent encounter: Secondary | ICD-10-CM | POA: Diagnosis not present

## 2021-03-31 DIAGNOSIS — L89151 Pressure ulcer of sacral region, stage 1: Secondary | ICD-10-CM | POA: Diagnosis not present

## 2021-03-31 DIAGNOSIS — L89616 Pressure-induced deep tissue damage of right heel: Secondary | ICD-10-CM | POA: Diagnosis not present

## 2021-03-31 DIAGNOSIS — L89896 Pressure-induced deep tissue damage of other site: Secondary | ICD-10-CM | POA: Diagnosis not present

## 2021-03-31 DIAGNOSIS — L89626 Pressure-induced deep tissue damage of left heel: Secondary | ICD-10-CM | POA: Diagnosis not present

## 2021-03-31 DIAGNOSIS — I130 Hypertensive heart and chronic kidney disease with heart failure and stage 1 through stage 4 chronic kidney disease, or unspecified chronic kidney disease: Secondary | ICD-10-CM | POA: Diagnosis not present

## 2021-03-31 DIAGNOSIS — Z466 Encounter for fitting and adjustment of urinary device: Secondary | ICD-10-CM | POA: Diagnosis not present

## 2021-03-31 DIAGNOSIS — I5032 Chronic diastolic (congestive) heart failure: Secondary | ICD-10-CM | POA: Diagnosis not present

## 2021-04-03 DIAGNOSIS — Z466 Encounter for fitting and adjustment of urinary device: Secondary | ICD-10-CM | POA: Diagnosis not present

## 2021-04-03 DIAGNOSIS — I5032 Chronic diastolic (congestive) heart failure: Secondary | ICD-10-CM | POA: Diagnosis not present

## 2021-04-03 DIAGNOSIS — S30810D Abrasion of lower back and pelvis, subsequent encounter: Secondary | ICD-10-CM | POA: Diagnosis not present

## 2021-04-03 DIAGNOSIS — L89896 Pressure-induced deep tissue damage of other site: Secondary | ICD-10-CM | POA: Diagnosis not present

## 2021-04-03 DIAGNOSIS — L89151 Pressure ulcer of sacral region, stage 1: Secondary | ICD-10-CM | POA: Diagnosis not present

## 2021-04-03 DIAGNOSIS — E1122 Type 2 diabetes mellitus with diabetic chronic kidney disease: Secondary | ICD-10-CM | POA: Diagnosis not present

## 2021-04-03 DIAGNOSIS — L89626 Pressure-induced deep tissue damage of left heel: Secondary | ICD-10-CM | POA: Diagnosis not present

## 2021-04-03 DIAGNOSIS — I130 Hypertensive heart and chronic kidney disease with heart failure and stage 1 through stage 4 chronic kidney disease, or unspecified chronic kidney disease: Secondary | ICD-10-CM | POA: Diagnosis not present

## 2021-04-03 DIAGNOSIS — L89616 Pressure-induced deep tissue damage of right heel: Secondary | ICD-10-CM | POA: Diagnosis not present

## 2021-04-08 DIAGNOSIS — E1122 Type 2 diabetes mellitus with diabetic chronic kidney disease: Secondary | ICD-10-CM | POA: Diagnosis not present

## 2021-04-08 DIAGNOSIS — S30810D Abrasion of lower back and pelvis, subsequent encounter: Secondary | ICD-10-CM | POA: Diagnosis not present

## 2021-04-08 DIAGNOSIS — L89151 Pressure ulcer of sacral region, stage 1: Secondary | ICD-10-CM | POA: Diagnosis not present

## 2021-04-08 DIAGNOSIS — L89626 Pressure-induced deep tissue damage of left heel: Secondary | ICD-10-CM | POA: Diagnosis not present

## 2021-04-08 DIAGNOSIS — L89896 Pressure-induced deep tissue damage of other site: Secondary | ICD-10-CM | POA: Diagnosis not present

## 2021-04-08 DIAGNOSIS — L89616 Pressure-induced deep tissue damage of right heel: Secondary | ICD-10-CM | POA: Diagnosis not present

## 2021-04-08 DIAGNOSIS — Z466 Encounter for fitting and adjustment of urinary device: Secondary | ICD-10-CM | POA: Diagnosis not present

## 2021-04-08 DIAGNOSIS — I130 Hypertensive heart and chronic kidney disease with heart failure and stage 1 through stage 4 chronic kidney disease, or unspecified chronic kidney disease: Secondary | ICD-10-CM | POA: Diagnosis not present

## 2021-04-08 DIAGNOSIS — I5032 Chronic diastolic (congestive) heart failure: Secondary | ICD-10-CM | POA: Diagnosis not present

## 2021-04-10 DIAGNOSIS — L89151 Pressure ulcer of sacral region, stage 1: Secondary | ICD-10-CM | POA: Diagnosis not present

## 2021-04-10 DIAGNOSIS — E1122 Type 2 diabetes mellitus with diabetic chronic kidney disease: Secondary | ICD-10-CM | POA: Diagnosis not present

## 2021-04-10 DIAGNOSIS — S30810D Abrasion of lower back and pelvis, subsequent encounter: Secondary | ICD-10-CM | POA: Diagnosis not present

## 2021-04-10 DIAGNOSIS — L89616 Pressure-induced deep tissue damage of right heel: Secondary | ICD-10-CM | POA: Diagnosis not present

## 2021-04-10 DIAGNOSIS — L89896 Pressure-induced deep tissue damage of other site: Secondary | ICD-10-CM | POA: Diagnosis not present

## 2021-04-10 DIAGNOSIS — L89626 Pressure-induced deep tissue damage of left heel: Secondary | ICD-10-CM | POA: Diagnosis not present

## 2021-04-10 DIAGNOSIS — I130 Hypertensive heart and chronic kidney disease with heart failure and stage 1 through stage 4 chronic kidney disease, or unspecified chronic kidney disease: Secondary | ICD-10-CM | POA: Diagnosis not present

## 2021-04-10 DIAGNOSIS — Z466 Encounter for fitting and adjustment of urinary device: Secondary | ICD-10-CM | POA: Diagnosis not present

## 2021-04-10 DIAGNOSIS — I5032 Chronic diastolic (congestive) heart failure: Secondary | ICD-10-CM | POA: Diagnosis not present

## 2021-04-14 DIAGNOSIS — I130 Hypertensive heart and chronic kidney disease with heart failure and stage 1 through stage 4 chronic kidney disease, or unspecified chronic kidney disease: Secondary | ICD-10-CM | POA: Diagnosis not present

## 2021-04-14 DIAGNOSIS — L89151 Pressure ulcer of sacral region, stage 1: Secondary | ICD-10-CM | POA: Diagnosis not present

## 2021-04-14 DIAGNOSIS — Z466 Encounter for fitting and adjustment of urinary device: Secondary | ICD-10-CM | POA: Diagnosis not present

## 2021-04-14 DIAGNOSIS — L89896 Pressure-induced deep tissue damage of other site: Secondary | ICD-10-CM | POA: Diagnosis not present

## 2021-04-14 DIAGNOSIS — S30810D Abrasion of lower back and pelvis, subsequent encounter: Secondary | ICD-10-CM | POA: Diagnosis not present

## 2021-04-14 DIAGNOSIS — E1122 Type 2 diabetes mellitus with diabetic chronic kidney disease: Secondary | ICD-10-CM | POA: Diagnosis not present

## 2021-04-14 DIAGNOSIS — L89616 Pressure-induced deep tissue damage of right heel: Secondary | ICD-10-CM | POA: Diagnosis not present

## 2021-04-14 DIAGNOSIS — L89626 Pressure-induced deep tissue damage of left heel: Secondary | ICD-10-CM | POA: Diagnosis not present

## 2021-04-14 DIAGNOSIS — I5032 Chronic diastolic (congestive) heart failure: Secondary | ICD-10-CM | POA: Diagnosis not present

## 2021-04-16 DIAGNOSIS — I130 Hypertensive heart and chronic kidney disease with heart failure and stage 1 through stage 4 chronic kidney disease, or unspecified chronic kidney disease: Secondary | ICD-10-CM | POA: Diagnosis not present

## 2021-04-16 DIAGNOSIS — L89151 Pressure ulcer of sacral region, stage 1: Secondary | ICD-10-CM | POA: Diagnosis not present

## 2021-04-16 DIAGNOSIS — S30810D Abrasion of lower back and pelvis, subsequent encounter: Secondary | ICD-10-CM | POA: Diagnosis not present

## 2021-04-16 DIAGNOSIS — Z466 Encounter for fitting and adjustment of urinary device: Secondary | ICD-10-CM | POA: Diagnosis not present

## 2021-04-16 DIAGNOSIS — I5032 Chronic diastolic (congestive) heart failure: Secondary | ICD-10-CM | POA: Diagnosis not present

## 2021-04-16 DIAGNOSIS — L89626 Pressure-induced deep tissue damage of left heel: Secondary | ICD-10-CM | POA: Diagnosis not present

## 2021-04-16 DIAGNOSIS — L89616 Pressure-induced deep tissue damage of right heel: Secondary | ICD-10-CM | POA: Diagnosis not present

## 2021-04-16 DIAGNOSIS — L89896 Pressure-induced deep tissue damage of other site: Secondary | ICD-10-CM | POA: Diagnosis not present

## 2021-04-16 DIAGNOSIS — E1122 Type 2 diabetes mellitus with diabetic chronic kidney disease: Secondary | ICD-10-CM | POA: Diagnosis not present

## 2021-04-17 DIAGNOSIS — L89616 Pressure-induced deep tissue damage of right heel: Secondary | ICD-10-CM | POA: Diagnosis not present

## 2021-04-17 DIAGNOSIS — S30810D Abrasion of lower back and pelvis, subsequent encounter: Secondary | ICD-10-CM | POA: Diagnosis not present

## 2021-04-17 DIAGNOSIS — I130 Hypertensive heart and chronic kidney disease with heart failure and stage 1 through stage 4 chronic kidney disease, or unspecified chronic kidney disease: Secondary | ICD-10-CM | POA: Diagnosis not present

## 2021-04-17 DIAGNOSIS — L89626 Pressure-induced deep tissue damage of left heel: Secondary | ICD-10-CM | POA: Diagnosis not present

## 2021-04-17 DIAGNOSIS — Z466 Encounter for fitting and adjustment of urinary device: Secondary | ICD-10-CM | POA: Diagnosis not present

## 2021-04-17 DIAGNOSIS — L89896 Pressure-induced deep tissue damage of other site: Secondary | ICD-10-CM | POA: Diagnosis not present

## 2021-04-17 DIAGNOSIS — L89151 Pressure ulcer of sacral region, stage 1: Secondary | ICD-10-CM | POA: Diagnosis not present

## 2021-04-17 DIAGNOSIS — E1122 Type 2 diabetes mellitus with diabetic chronic kidney disease: Secondary | ICD-10-CM | POA: Diagnosis not present

## 2021-04-17 DIAGNOSIS — I5032 Chronic diastolic (congestive) heart failure: Secondary | ICD-10-CM | POA: Diagnosis not present

## 2021-04-18 DIAGNOSIS — R509 Fever, unspecified: Secondary | ICD-10-CM | POA: Diagnosis not present

## 2021-04-18 DIAGNOSIS — L89892 Pressure ulcer of other site, stage 2: Secondary | ICD-10-CM | POA: Diagnosis not present

## 2021-04-18 DIAGNOSIS — I872 Venous insufficiency (chronic) (peripheral): Secondary | ICD-10-CM | POA: Diagnosis not present

## 2021-04-18 DIAGNOSIS — E1122 Type 2 diabetes mellitus with diabetic chronic kidney disease: Secondary | ICD-10-CM | POA: Diagnosis not present

## 2021-04-18 DIAGNOSIS — I130 Hypertensive heart and chronic kidney disease with heart failure and stage 1 through stage 4 chronic kidney disease, or unspecified chronic kidney disease: Secondary | ICD-10-CM | POA: Diagnosis not present

## 2021-04-18 DIAGNOSIS — R404 Transient alteration of awareness: Secondary | ICD-10-CM | POA: Diagnosis not present

## 2021-04-18 DIAGNOSIS — R059 Cough, unspecified: Secondary | ICD-10-CM | POA: Diagnosis not present

## 2021-04-18 DIAGNOSIS — N1831 Chronic kidney disease, stage 3a: Secondary | ICD-10-CM | POA: Diagnosis not present

## 2021-04-18 DIAGNOSIS — S30810D Abrasion of lower back and pelvis, subsequent encounter: Secondary | ICD-10-CM | POA: Diagnosis not present

## 2021-04-18 DIAGNOSIS — E1151 Type 2 diabetes mellitus with diabetic peripheral angiopathy without gangrene: Secondary | ICD-10-CM | POA: Diagnosis not present

## 2021-04-18 DIAGNOSIS — I5032 Chronic diastolic (congestive) heart failure: Secondary | ICD-10-CM | POA: Diagnosis not present

## 2021-04-18 DIAGNOSIS — Z466 Encounter for fitting and adjustment of urinary device: Secondary | ICD-10-CM | POA: Diagnosis not present

## 2021-04-19 DIAGNOSIS — U071 COVID-19: Secondary | ICD-10-CM | POA: Diagnosis not present

## 2021-04-19 DIAGNOSIS — J1282 Pneumonia due to coronavirus disease 2019: Secondary | ICD-10-CM | POA: Diagnosis not present

## 2021-04-20 DIAGNOSIS — L89152 Pressure ulcer of sacral region, stage 2: Secondary | ICD-10-CM | POA: Diagnosis not present

## 2021-04-20 DIAGNOSIS — M7989 Other specified soft tissue disorders: Secondary | ICD-10-CM | POA: Diagnosis not present

## 2021-04-20 DIAGNOSIS — L89613 Pressure ulcer of right heel, stage 3: Secondary | ICD-10-CM | POA: Diagnosis not present

## 2021-04-20 DIAGNOSIS — Z743 Need for continuous supervision: Secondary | ICD-10-CM | POA: Diagnosis not present

## 2021-04-20 DIAGNOSIS — L89893 Pressure ulcer of other site, stage 3: Secondary | ICD-10-CM | POA: Diagnosis not present

## 2021-04-20 DIAGNOSIS — J1282 Pneumonia due to coronavirus disease 2019: Secondary | ICD-10-CM | POA: Diagnosis not present

## 2021-04-20 DIAGNOSIS — Z7409 Other reduced mobility: Secondary | ICD-10-CM | POA: Diagnosis not present

## 2021-04-20 DIAGNOSIS — L308 Other specified dermatitis: Secondary | ICD-10-CM | POA: Diagnosis not present

## 2021-04-20 DIAGNOSIS — N390 Urinary tract infection, site not specified: Secondary | ICD-10-CM | POA: Diagnosis not present

## 2021-04-20 DIAGNOSIS — J9611 Chronic respiratory failure with hypoxia: Secondary | ICD-10-CM | POA: Diagnosis not present

## 2021-04-20 DIAGNOSIS — J189 Pneumonia, unspecified organism: Secondary | ICD-10-CM | POA: Diagnosis not present

## 2021-04-20 DIAGNOSIS — I10 Essential (primary) hypertension: Secondary | ICD-10-CM | POA: Diagnosis not present

## 2021-04-20 DIAGNOSIS — N1832 Chronic kidney disease, stage 3b: Secondary | ICD-10-CM | POA: Diagnosis not present

## 2021-04-20 DIAGNOSIS — L89610 Pressure ulcer of right heel, unstageable: Secondary | ICD-10-CM | POA: Diagnosis not present

## 2021-04-20 DIAGNOSIS — I130 Hypertensive heart and chronic kidney disease with heart failure and stage 1 through stage 4 chronic kidney disease, or unspecified chronic kidney disease: Secondary | ICD-10-CM | POA: Diagnosis not present

## 2021-04-20 DIAGNOSIS — E1111 Type 2 diabetes mellitus with ketoacidosis with coma: Secondary | ICD-10-CM | POA: Diagnosis not present

## 2021-04-20 DIAGNOSIS — R404 Transient alteration of awareness: Secondary | ICD-10-CM | POA: Diagnosis not present

## 2021-04-20 DIAGNOSIS — Z66 Do not resuscitate: Secondary | ICD-10-CM | POA: Diagnosis not present

## 2021-04-20 DIAGNOSIS — J449 Chronic obstructive pulmonary disease, unspecified: Secondary | ICD-10-CM | POA: Diagnosis not present

## 2021-04-20 DIAGNOSIS — U071 COVID-19: Secondary | ICD-10-CM | POA: Diagnosis not present

## 2021-04-20 DIAGNOSIS — R410 Disorientation, unspecified: Secondary | ICD-10-CM | POA: Diagnosis not present

## 2021-04-20 DIAGNOSIS — I5032 Chronic diastolic (congestive) heart failure: Secondary | ICD-10-CM | POA: Diagnosis not present

## 2021-04-20 DIAGNOSIS — J440 Chronic obstructive pulmonary disease with acute lower respiratory infection: Secondary | ICD-10-CM | POA: Diagnosis not present

## 2021-04-20 DIAGNOSIS — F918 Other conduct disorders: Secondary | ICD-10-CM | POA: Diagnosis not present

## 2021-04-20 DIAGNOSIS — L89312 Pressure ulcer of right buttock, stage 2: Secondary | ICD-10-CM | POA: Diagnosis not present

## 2021-04-20 DIAGNOSIS — E1122 Type 2 diabetes mellitus with diabetic chronic kidney disease: Secondary | ICD-10-CM | POA: Diagnosis not present

## 2021-04-20 DIAGNOSIS — Z794 Long term (current) use of insulin: Secondary | ICD-10-CM | POA: Diagnosis not present

## 2021-04-21 DIAGNOSIS — Z794 Long term (current) use of insulin: Secondary | ICD-10-CM | POA: Diagnosis not present

## 2021-04-21 DIAGNOSIS — E1111 Type 2 diabetes mellitus with ketoacidosis with coma: Secondary | ICD-10-CM | POA: Diagnosis not present

## 2021-04-21 DIAGNOSIS — E1122 Type 2 diabetes mellitus with diabetic chronic kidney disease: Secondary | ICD-10-CM | POA: Diagnosis not present

## 2021-04-21 DIAGNOSIS — N1832 Chronic kidney disease, stage 3b: Secondary | ICD-10-CM | POA: Diagnosis not present

## 2021-04-21 DIAGNOSIS — M7989 Other specified soft tissue disorders: Secondary | ICD-10-CM | POA: Diagnosis not present

## 2021-04-25 DIAGNOSIS — M24532 Contracture, left wrist: Secondary | ICD-10-CM | POA: Diagnosis not present

## 2021-04-25 DIAGNOSIS — R5381 Other malaise: Secondary | ICD-10-CM | POA: Diagnosis not present

## 2021-04-25 DIAGNOSIS — I10 Essential (primary) hypertension: Secondary | ICD-10-CM | POA: Diagnosis not present

## 2021-04-25 DIAGNOSIS — N183 Chronic kidney disease, stage 3 unspecified: Secondary | ICD-10-CM | POA: Diagnosis not present

## 2021-04-25 DIAGNOSIS — J984 Other disorders of lung: Secondary | ICD-10-CM | POA: Diagnosis not present

## 2021-04-29 DIAGNOSIS — E119 Type 2 diabetes mellitus without complications: Secondary | ICD-10-CM | POA: Diagnosis not present

## 2021-04-29 DIAGNOSIS — J984 Other disorders of lung: Secondary | ICD-10-CM | POA: Diagnosis not present

## 2021-04-29 DIAGNOSIS — M24532 Contracture, left wrist: Secondary | ICD-10-CM | POA: Diagnosis not present

## 2021-04-29 DIAGNOSIS — R451 Restlessness and agitation: Secondary | ICD-10-CM | POA: Diagnosis not present

## 2021-04-29 DIAGNOSIS — Z9981 Dependence on supplemental oxygen: Secondary | ICD-10-CM | POA: Diagnosis not present

## 2021-04-29 DIAGNOSIS — L89890 Pressure ulcer of other site, unstageable: Secondary | ICD-10-CM | POA: Diagnosis not present

## 2021-04-29 DIAGNOSIS — I10 Essential (primary) hypertension: Secondary | ICD-10-CM | POA: Diagnosis not present

## 2021-04-29 DIAGNOSIS — J9611 Chronic respiratory failure with hypoxia: Secondary | ICD-10-CM | POA: Diagnosis not present

## 2021-04-30 DIAGNOSIS — E1122 Type 2 diabetes mellitus with diabetic chronic kidney disease: Secondary | ICD-10-CM | POA: Diagnosis not present

## 2021-05-01 DIAGNOSIS — F0391 Unspecified dementia with behavioral disturbance: Secondary | ICD-10-CM | POA: Diagnosis not present

## 2021-05-01 DIAGNOSIS — F330 Major depressive disorder, recurrent, mild: Secondary | ICD-10-CM | POA: Diagnosis not present

## 2021-05-01 DIAGNOSIS — F05 Delirium due to known physiological condition: Secondary | ICD-10-CM | POA: Diagnosis not present

## 2021-05-05 DIAGNOSIS — J449 Chronic obstructive pulmonary disease, unspecified: Secondary | ICD-10-CM | POA: Diagnosis not present

## 2021-05-05 DIAGNOSIS — J9611 Chronic respiratory failure with hypoxia: Secondary | ICD-10-CM | POA: Diagnosis not present

## 2021-05-05 DIAGNOSIS — F3341 Major depressive disorder, recurrent, in partial remission: Secondary | ICD-10-CM | POA: Diagnosis not present

## 2021-05-05 DIAGNOSIS — I10 Essential (primary) hypertension: Secondary | ICD-10-CM | POA: Diagnosis not present

## 2021-05-05 DIAGNOSIS — E119 Type 2 diabetes mellitus without complications: Secondary | ICD-10-CM | POA: Diagnosis not present

## 2021-05-09 DIAGNOSIS — J449 Chronic obstructive pulmonary disease, unspecified: Secondary | ICD-10-CM | POA: Diagnosis not present

## 2021-05-09 DIAGNOSIS — Z9981 Dependence on supplemental oxygen: Secondary | ICD-10-CM | POA: Diagnosis not present

## 2021-05-09 DIAGNOSIS — N1831 Chronic kidney disease, stage 3a: Secondary | ICD-10-CM | POA: Diagnosis not present

## 2021-05-09 DIAGNOSIS — F0390 Unspecified dementia without behavioral disturbance: Secondary | ICD-10-CM | POA: Diagnosis not present

## 2021-05-09 DIAGNOSIS — J1282 Pneumonia due to coronavirus disease 2019: Secondary | ICD-10-CM | POA: Diagnosis not present

## 2021-05-09 DIAGNOSIS — L97419 Non-pressure chronic ulcer of right heel and midfoot with unspecified severity: Secondary | ICD-10-CM | POA: Diagnosis not present

## 2021-05-09 DIAGNOSIS — Z9189 Other specified personal risk factors, not elsewhere classified: Secondary | ICD-10-CM | POA: Diagnosis not present

## 2021-05-09 DIAGNOSIS — I129 Hypertensive chronic kidney disease with stage 1 through stage 4 chronic kidney disease, or unspecified chronic kidney disease: Secondary | ICD-10-CM | POA: Diagnosis not present

## 2021-05-09 DIAGNOSIS — E1122 Type 2 diabetes mellitus with diabetic chronic kidney disease: Secondary | ICD-10-CM | POA: Diagnosis not present

## 2021-05-10 DIAGNOSIS — I130 Hypertensive heart and chronic kidney disease with heart failure and stage 1 through stage 4 chronic kidney disease, or unspecified chronic kidney disease: Secondary | ICD-10-CM | POA: Diagnosis not present

## 2021-05-10 DIAGNOSIS — I5032 Chronic diastolic (congestive) heart failure: Secondary | ICD-10-CM | POA: Diagnosis not present

## 2021-05-10 DIAGNOSIS — Z466 Encounter for fitting and adjustment of urinary device: Secondary | ICD-10-CM | POA: Diagnosis not present

## 2021-05-10 DIAGNOSIS — I872 Venous insufficiency (chronic) (peripheral): Secondary | ICD-10-CM | POA: Diagnosis not present

## 2021-05-10 DIAGNOSIS — E1151 Type 2 diabetes mellitus with diabetic peripheral angiopathy without gangrene: Secondary | ICD-10-CM | POA: Diagnosis not present

## 2021-05-10 DIAGNOSIS — S30810D Abrasion of lower back and pelvis, subsequent encounter: Secondary | ICD-10-CM | POA: Diagnosis not present

## 2021-05-10 DIAGNOSIS — L89892 Pressure ulcer of other site, stage 2: Secondary | ICD-10-CM | POA: Diagnosis not present

## 2021-05-10 DIAGNOSIS — N1831 Chronic kidney disease, stage 3a: Secondary | ICD-10-CM | POA: Diagnosis not present

## 2021-05-10 DIAGNOSIS — E1122 Type 2 diabetes mellitus with diabetic chronic kidney disease: Secondary | ICD-10-CM | POA: Diagnosis not present

## 2021-05-13 DIAGNOSIS — I130 Hypertensive heart and chronic kidney disease with heart failure and stage 1 through stage 4 chronic kidney disease, or unspecified chronic kidney disease: Secondary | ICD-10-CM | POA: Diagnosis not present

## 2021-05-13 DIAGNOSIS — I5032 Chronic diastolic (congestive) heart failure: Secondary | ICD-10-CM | POA: Diagnosis not present

## 2021-05-13 DIAGNOSIS — I872 Venous insufficiency (chronic) (peripheral): Secondary | ICD-10-CM | POA: Diagnosis not present

## 2021-05-13 DIAGNOSIS — N1831 Chronic kidney disease, stage 3a: Secondary | ICD-10-CM | POA: Diagnosis not present

## 2021-05-13 DIAGNOSIS — E1151 Type 2 diabetes mellitus with diabetic peripheral angiopathy without gangrene: Secondary | ICD-10-CM | POA: Diagnosis not present

## 2021-05-13 DIAGNOSIS — L89892 Pressure ulcer of other site, stage 2: Secondary | ICD-10-CM | POA: Diagnosis not present

## 2021-05-13 DIAGNOSIS — S30810D Abrasion of lower back and pelvis, subsequent encounter: Secondary | ICD-10-CM | POA: Diagnosis not present

## 2021-05-13 DIAGNOSIS — Z466 Encounter for fitting and adjustment of urinary device: Secondary | ICD-10-CM | POA: Diagnosis not present

## 2021-05-13 DIAGNOSIS — E1122 Type 2 diabetes mellitus with diabetic chronic kidney disease: Secondary | ICD-10-CM | POA: Diagnosis not present

## 2021-05-14 DIAGNOSIS — I130 Hypertensive heart and chronic kidney disease with heart failure and stage 1 through stage 4 chronic kidney disease, or unspecified chronic kidney disease: Secondary | ICD-10-CM | POA: Diagnosis not present

## 2021-05-14 DIAGNOSIS — N1831 Chronic kidney disease, stage 3a: Secondary | ICD-10-CM | POA: Diagnosis not present

## 2021-05-14 DIAGNOSIS — E1122 Type 2 diabetes mellitus with diabetic chronic kidney disease: Secondary | ICD-10-CM | POA: Diagnosis not present

## 2021-05-14 DIAGNOSIS — L89892 Pressure ulcer of other site, stage 2: Secondary | ICD-10-CM | POA: Diagnosis not present

## 2021-05-14 DIAGNOSIS — Z466 Encounter for fitting and adjustment of urinary device: Secondary | ICD-10-CM | POA: Diagnosis not present

## 2021-05-14 DIAGNOSIS — E1151 Type 2 diabetes mellitus with diabetic peripheral angiopathy without gangrene: Secondary | ICD-10-CM | POA: Diagnosis not present

## 2021-05-14 DIAGNOSIS — I5032 Chronic diastolic (congestive) heart failure: Secondary | ICD-10-CM | POA: Diagnosis not present

## 2021-05-14 DIAGNOSIS — S30810D Abrasion of lower back and pelvis, subsequent encounter: Secondary | ICD-10-CM | POA: Diagnosis not present

## 2021-05-14 DIAGNOSIS — I872 Venous insufficiency (chronic) (peripheral): Secondary | ICD-10-CM | POA: Diagnosis not present

## 2021-05-16 DIAGNOSIS — I872 Venous insufficiency (chronic) (peripheral): Secondary | ICD-10-CM | POA: Diagnosis not present

## 2021-05-16 DIAGNOSIS — L89892 Pressure ulcer of other site, stage 2: Secondary | ICD-10-CM | POA: Diagnosis not present

## 2021-05-16 DIAGNOSIS — I130 Hypertensive heart and chronic kidney disease with heart failure and stage 1 through stage 4 chronic kidney disease, or unspecified chronic kidney disease: Secondary | ICD-10-CM | POA: Diagnosis not present

## 2021-05-16 DIAGNOSIS — E1151 Type 2 diabetes mellitus with diabetic peripheral angiopathy without gangrene: Secondary | ICD-10-CM | POA: Diagnosis not present

## 2021-05-16 DIAGNOSIS — Z466 Encounter for fitting and adjustment of urinary device: Secondary | ICD-10-CM | POA: Diagnosis not present

## 2021-05-16 DIAGNOSIS — I5032 Chronic diastolic (congestive) heart failure: Secondary | ICD-10-CM | POA: Diagnosis not present

## 2021-05-16 DIAGNOSIS — E1122 Type 2 diabetes mellitus with diabetic chronic kidney disease: Secondary | ICD-10-CM | POA: Diagnosis not present

## 2021-05-16 DIAGNOSIS — N1831 Chronic kidney disease, stage 3a: Secondary | ICD-10-CM | POA: Diagnosis not present

## 2021-05-16 DIAGNOSIS — S30810D Abrasion of lower back and pelvis, subsequent encounter: Secondary | ICD-10-CM | POA: Diagnosis not present

## 2021-05-21 DIAGNOSIS — F0390 Unspecified dementia without behavioral disturbance: Secondary | ICD-10-CM | POA: Diagnosis not present

## 2021-05-21 DIAGNOSIS — J9611 Chronic respiratory failure with hypoxia: Secondary | ICD-10-CM | POA: Diagnosis not present

## 2021-05-21 DIAGNOSIS — F418 Other specified anxiety disorders: Secondary | ICD-10-CM | POA: Diagnosis not present

## 2021-05-21 DIAGNOSIS — R531 Weakness: Secondary | ICD-10-CM | POA: Diagnosis not present

## 2021-05-21 DIAGNOSIS — J159 Unspecified bacterial pneumonia: Secondary | ICD-10-CM | POA: Diagnosis not present

## 2021-05-21 DIAGNOSIS — R0600 Dyspnea, unspecified: Secondary | ICD-10-CM | POA: Diagnosis not present

## 2021-05-21 DIAGNOSIS — M85871 Other specified disorders of bone density and structure, right ankle and foot: Secondary | ICD-10-CM | POA: Diagnosis not present

## 2021-05-21 DIAGNOSIS — E785 Hyperlipidemia, unspecified: Secondary | ICD-10-CM | POA: Diagnosis not present

## 2021-05-21 DIAGNOSIS — R059 Cough, unspecified: Secondary | ICD-10-CM | POA: Diagnosis not present

## 2021-05-21 DIAGNOSIS — R000 Tachycardia, unspecified: Secondary | ICD-10-CM | POA: Diagnosis not present

## 2021-05-21 DIAGNOSIS — M86171 Other acute osteomyelitis, right ankle and foot: Secondary | ICD-10-CM | POA: Diagnosis not present

## 2021-05-21 DIAGNOSIS — L89892 Pressure ulcer of other site, stage 2: Secondary | ICD-10-CM | POA: Diagnosis not present

## 2021-05-21 DIAGNOSIS — J449 Chronic obstructive pulmonary disease, unspecified: Secondary | ICD-10-CM | POA: Diagnosis not present

## 2021-05-21 DIAGNOSIS — T83511A Infection and inflammatory reaction due to indwelling urethral catheter, initial encounter: Secondary | ICD-10-CM | POA: Diagnosis not present

## 2021-05-21 DIAGNOSIS — E119 Type 2 diabetes mellitus without complications: Secondary | ICD-10-CM | POA: Diagnosis not present

## 2021-05-21 DIAGNOSIS — Z9981 Dependence on supplemental oxygen: Secondary | ICD-10-CM | POA: Diagnosis not present

## 2021-05-21 DIAGNOSIS — U071 COVID-19: Secondary | ICD-10-CM | POA: Diagnosis not present

## 2021-05-21 DIAGNOSIS — J440 Chronic obstructive pulmonary disease with acute lower respiratory infection: Secondary | ICD-10-CM | POA: Diagnosis not present

## 2021-05-21 DIAGNOSIS — J1282 Pneumonia due to coronavirus disease 2019: Secondary | ICD-10-CM | POA: Diagnosis not present

## 2021-05-21 DIAGNOSIS — L89610 Pressure ulcer of right heel, unstageable: Secondary | ICD-10-CM | POA: Diagnosis not present

## 2021-05-21 DIAGNOSIS — E11621 Type 2 diabetes mellitus with foot ulcer: Secondary | ICD-10-CM | POA: Diagnosis not present

## 2021-05-21 DIAGNOSIS — E1122 Type 2 diabetes mellitus with diabetic chronic kidney disease: Secondary | ICD-10-CM | POA: Diagnosis not present

## 2021-05-21 DIAGNOSIS — Z8616 Personal history of COVID-19: Secondary | ICD-10-CM | POA: Diagnosis not present

## 2021-05-21 DIAGNOSIS — F17210 Nicotine dependence, cigarettes, uncomplicated: Secondary | ICD-10-CM | POA: Diagnosis not present

## 2021-05-21 DIAGNOSIS — R0902 Hypoxemia: Secondary | ICD-10-CM | POA: Diagnosis not present

## 2021-05-21 DIAGNOSIS — N390 Urinary tract infection, site not specified: Secondary | ICD-10-CM | POA: Diagnosis not present

## 2021-05-21 DIAGNOSIS — I130 Hypertensive heart and chronic kidney disease with heart failure and stage 1 through stage 4 chronic kidney disease, or unspecified chronic kidney disease: Secondary | ICD-10-CM | POA: Diagnosis not present

## 2021-05-21 DIAGNOSIS — I5032 Chronic diastolic (congestive) heart failure: Secondary | ICD-10-CM | POA: Diagnosis not present

## 2021-05-21 DIAGNOSIS — L97519 Non-pressure chronic ulcer of other part of right foot with unspecified severity: Secondary | ICD-10-CM | POA: Diagnosis not present

## 2021-05-21 DIAGNOSIS — Z743 Need for continuous supervision: Secondary | ICD-10-CM | POA: Diagnosis not present

## 2021-05-21 DIAGNOSIS — M19071 Primary osteoarthritis, right ankle and foot: Secondary | ICD-10-CM | POA: Diagnosis not present

## 2021-05-21 DIAGNOSIS — E876 Hypokalemia: Secondary | ICD-10-CM | POA: Diagnosis not present

## 2021-05-21 DIAGNOSIS — J189 Pneumonia, unspecified organism: Secondary | ICD-10-CM | POA: Diagnosis not present

## 2021-05-21 DIAGNOSIS — L89890 Pressure ulcer of other site, unstageable: Secondary | ICD-10-CM | POA: Diagnosis not present

## 2021-05-21 DIAGNOSIS — W19XXXA Unspecified fall, initial encounter: Secondary | ICD-10-CM | POA: Diagnosis not present

## 2021-05-21 DIAGNOSIS — Z466 Encounter for fitting and adjustment of urinary device: Secondary | ICD-10-CM | POA: Diagnosis not present

## 2021-05-22 DIAGNOSIS — R000 Tachycardia, unspecified: Secondary | ICD-10-CM | POA: Diagnosis not present

## 2021-05-23 DIAGNOSIS — J449 Chronic obstructive pulmonary disease, unspecified: Secondary | ICD-10-CM | POA: Diagnosis not present

## 2021-05-23 DIAGNOSIS — M86171 Other acute osteomyelitis, right ankle and foot: Secondary | ICD-10-CM | POA: Diagnosis not present

## 2021-05-23 DIAGNOSIS — F0390 Unspecified dementia without behavioral disturbance: Secondary | ICD-10-CM | POA: Diagnosis not present

## 2021-05-23 DIAGNOSIS — J9611 Chronic respiratory failure with hypoxia: Secondary | ICD-10-CM | POA: Diagnosis not present

## 2021-05-23 DIAGNOSIS — E119 Type 2 diabetes mellitus without complications: Secondary | ICD-10-CM | POA: Diagnosis not present

## 2021-05-23 DIAGNOSIS — M19071 Primary osteoarthritis, right ankle and foot: Secondary | ICD-10-CM | POA: Diagnosis not present

## 2021-05-23 DIAGNOSIS — Z9981 Dependence on supplemental oxygen: Secondary | ICD-10-CM | POA: Diagnosis not present

## 2021-05-23 DIAGNOSIS — M85871 Other specified disorders of bone density and structure, right ankle and foot: Secondary | ICD-10-CM | POA: Diagnosis not present

## 2021-05-23 DIAGNOSIS — E785 Hyperlipidemia, unspecified: Secondary | ICD-10-CM | POA: Diagnosis not present

## 2021-05-23 DIAGNOSIS — F418 Other specified anxiety disorders: Secondary | ICD-10-CM | POA: Diagnosis not present

## 2021-05-23 DIAGNOSIS — E876 Hypokalemia: Secondary | ICD-10-CM | POA: Diagnosis not present

## 2021-05-24 DIAGNOSIS — R0902 Hypoxemia: Secondary | ICD-10-CM | POA: Diagnosis not present

## 2021-05-24 DIAGNOSIS — L97519 Non-pressure chronic ulcer of other part of right foot with unspecified severity: Secondary | ICD-10-CM | POA: Diagnosis not present

## 2021-05-24 DIAGNOSIS — W19XXXA Unspecified fall, initial encounter: Secondary | ICD-10-CM | POA: Diagnosis not present

## 2021-05-24 DIAGNOSIS — E11621 Type 2 diabetes mellitus with foot ulcer: Secondary | ICD-10-CM | POA: Diagnosis not present

## 2021-05-24 DIAGNOSIS — J449 Chronic obstructive pulmonary disease, unspecified: Secondary | ICD-10-CM | POA: Diagnosis not present

## 2021-05-24 DIAGNOSIS — J9611 Chronic respiratory failure with hypoxia: Secondary | ICD-10-CM | POA: Diagnosis not present

## 2021-05-24 DIAGNOSIS — F0390 Unspecified dementia without behavioral disturbance: Secondary | ICD-10-CM | POA: Diagnosis not present

## 2021-05-24 DIAGNOSIS — Z9981 Dependence on supplemental oxygen: Secondary | ICD-10-CM | POA: Diagnosis not present

## 2021-05-24 DIAGNOSIS — Z743 Need for continuous supervision: Secondary | ICD-10-CM | POA: Diagnosis not present

## 2021-05-24 DIAGNOSIS — L89892 Pressure ulcer of other site, stage 2: Secondary | ICD-10-CM | POA: Diagnosis not present

## 2021-05-26 DIAGNOSIS — I130 Hypertensive heart and chronic kidney disease with heart failure and stage 1 through stage 4 chronic kidney disease, or unspecified chronic kidney disease: Secondary | ICD-10-CM | POA: Diagnosis not present

## 2021-05-26 DIAGNOSIS — I5032 Chronic diastolic (congestive) heart failure: Secondary | ICD-10-CM | POA: Diagnosis not present

## 2021-05-26 DIAGNOSIS — L89610 Pressure ulcer of right heel, unstageable: Secondary | ICD-10-CM | POA: Diagnosis not present

## 2021-05-26 DIAGNOSIS — U071 COVID-19: Secondary | ICD-10-CM | POA: Diagnosis not present

## 2021-05-26 DIAGNOSIS — J440 Chronic obstructive pulmonary disease with acute lower respiratory infection: Secondary | ICD-10-CM | POA: Diagnosis not present

## 2021-05-26 DIAGNOSIS — Z9981 Dependence on supplemental oxygen: Secondary | ICD-10-CM | POA: Diagnosis not present

## 2021-05-26 DIAGNOSIS — J449 Chronic obstructive pulmonary disease, unspecified: Secondary | ICD-10-CM | POA: Diagnosis not present

## 2021-05-26 DIAGNOSIS — L89890 Pressure ulcer of other site, unstageable: Secondary | ICD-10-CM | POA: Diagnosis not present

## 2021-05-26 DIAGNOSIS — J1282 Pneumonia due to coronavirus disease 2019: Secondary | ICD-10-CM | POA: Diagnosis not present

## 2021-05-26 DIAGNOSIS — J9611 Chronic respiratory failure with hypoxia: Secondary | ICD-10-CM | POA: Diagnosis not present

## 2021-05-26 DIAGNOSIS — Z466 Encounter for fitting and adjustment of urinary device: Secondary | ICD-10-CM | POA: Diagnosis not present

## 2021-05-26 DIAGNOSIS — E1122 Type 2 diabetes mellitus with diabetic chronic kidney disease: Secondary | ICD-10-CM | POA: Diagnosis not present

## 2021-05-27 DIAGNOSIS — R262 Difficulty in walking, not elsewhere classified: Secondary | ICD-10-CM | POA: Diagnosis not present

## 2021-05-27 DIAGNOSIS — Z741 Need for assistance with personal care: Secondary | ICD-10-CM | POA: Diagnosis not present

## 2021-05-27 DIAGNOSIS — J189 Pneumonia, unspecified organism: Secondary | ICD-10-CM | POA: Diagnosis not present

## 2021-05-27 DIAGNOSIS — Z9189 Other specified personal risk factors, not elsewhere classified: Secondary | ICD-10-CM | POA: Diagnosis not present

## 2021-05-27 DIAGNOSIS — I129 Hypertensive chronic kidney disease with stage 1 through stage 4 chronic kidney disease, or unspecified chronic kidney disease: Secondary | ICD-10-CM | POA: Diagnosis not present

## 2021-05-27 DIAGNOSIS — Z9981 Dependence on supplemental oxygen: Secondary | ICD-10-CM | POA: Diagnosis not present

## 2021-05-27 DIAGNOSIS — J449 Chronic obstructive pulmonary disease, unspecified: Secondary | ICD-10-CM | POA: Diagnosis not present

## 2021-05-27 DIAGNOSIS — F0391 Unspecified dementia with behavioral disturbance: Secondary | ICD-10-CM | POA: Diagnosis not present

## 2021-05-27 DIAGNOSIS — N1831 Chronic kidney disease, stage 3a: Secondary | ICD-10-CM | POA: Diagnosis not present

## 2021-05-28 DIAGNOSIS — G9340 Encephalopathy, unspecified: Secondary | ICD-10-CM | POA: Diagnosis not present

## 2021-05-28 DIAGNOSIS — F418 Other specified anxiety disorders: Secondary | ICD-10-CM | POA: Diagnosis not present

## 2021-05-28 DIAGNOSIS — U071 COVID-19: Secondary | ICD-10-CM | POA: Diagnosis not present

## 2021-05-28 DIAGNOSIS — J9621 Acute and chronic respiratory failure with hypoxia: Secondary | ICD-10-CM | POA: Diagnosis not present

## 2021-05-28 DIAGNOSIS — Z466 Encounter for fitting and adjustment of urinary device: Secondary | ICD-10-CM | POA: Diagnosis not present

## 2021-05-28 DIAGNOSIS — Z515 Encounter for palliative care: Secondary | ICD-10-CM | POA: Diagnosis not present

## 2021-05-28 DIAGNOSIS — R451 Restlessness and agitation: Secondary | ICD-10-CM | POA: Diagnosis not present

## 2021-05-28 DIAGNOSIS — J441 Chronic obstructive pulmonary disease with (acute) exacerbation: Secondary | ICD-10-CM | POA: Diagnosis not present

## 2021-05-28 DIAGNOSIS — Z9981 Dependence on supplemental oxygen: Secondary | ICD-10-CM | POA: Diagnosis not present

## 2021-05-28 DIAGNOSIS — Z743 Need for continuous supervision: Secondary | ICD-10-CM | POA: Diagnosis not present

## 2021-05-28 DIAGNOSIS — I5032 Chronic diastolic (congestive) heart failure: Secondary | ICD-10-CM | POA: Diagnosis not present

## 2021-05-28 DIAGNOSIS — I130 Hypertensive heart and chronic kidney disease with heart failure and stage 1 through stage 4 chronic kidney disease, or unspecified chronic kidney disease: Secondary | ICD-10-CM | POA: Diagnosis not present

## 2021-05-28 DIAGNOSIS — R0602 Shortness of breath: Secondary | ICD-10-CM | POA: Diagnosis not present

## 2021-05-28 DIAGNOSIS — L89890 Pressure ulcer of other site, unstageable: Secondary | ICD-10-CM | POA: Diagnosis not present

## 2021-05-28 DIAGNOSIS — J449 Chronic obstructive pulmonary disease, unspecified: Secondary | ICD-10-CM | POA: Diagnosis not present

## 2021-05-28 DIAGNOSIS — R5381 Other malaise: Secondary | ICD-10-CM | POA: Diagnosis not present

## 2021-05-28 DIAGNOSIS — J440 Chronic obstructive pulmonary disease with acute lower respiratory infection: Secondary | ICD-10-CM | POA: Diagnosis not present

## 2021-05-28 DIAGNOSIS — A419 Sepsis, unspecified organism: Secondary | ICD-10-CM | POA: Diagnosis not present

## 2021-05-28 DIAGNOSIS — R000 Tachycardia, unspecified: Secondary | ICD-10-CM | POA: Diagnosis not present

## 2021-05-28 DIAGNOSIS — L89610 Pressure ulcer of right heel, unstageable: Secondary | ICD-10-CM | POA: Diagnosis not present

## 2021-05-28 DIAGNOSIS — J690 Pneumonitis due to inhalation of food and vomit: Secondary | ICD-10-CM | POA: Diagnosis not present

## 2021-05-28 DIAGNOSIS — F0390 Unspecified dementia without behavioral disturbance: Secondary | ICD-10-CM | POA: Diagnosis not present

## 2021-05-28 DIAGNOSIS — E119 Type 2 diabetes mellitus without complications: Secondary | ICD-10-CM | POA: Diagnosis not present

## 2021-05-28 DIAGNOSIS — A4189 Other specified sepsis: Secondary | ICD-10-CM | POA: Diagnosis not present

## 2021-05-28 DIAGNOSIS — E1122 Type 2 diabetes mellitus with diabetic chronic kidney disease: Secondary | ICD-10-CM | POA: Diagnosis not present

## 2021-05-28 DIAGNOSIS — J9622 Acute and chronic respiratory failure with hypercapnia: Secondary | ICD-10-CM | POA: Diagnosis not present

## 2021-05-28 DIAGNOSIS — R1310 Dysphagia, unspecified: Secondary | ICD-10-CM | POA: Diagnosis not present

## 2021-05-28 DIAGNOSIS — R6520 Severe sepsis without septic shock: Secondary | ICD-10-CM | POA: Diagnosis not present

## 2021-05-28 DIAGNOSIS — J1282 Pneumonia due to coronavirus disease 2019: Secondary | ICD-10-CM | POA: Diagnosis not present

## 2021-05-28 DIAGNOSIS — E43 Unspecified severe protein-calorie malnutrition: Secondary | ICD-10-CM | POA: Diagnosis not present

## 2021-05-28 DIAGNOSIS — J189 Pneumonia, unspecified organism: Secondary | ICD-10-CM | POA: Diagnosis not present

## 2021-05-28 DIAGNOSIS — Z66 Do not resuscitate: Secondary | ICD-10-CM | POA: Diagnosis not present

## 2021-05-28 DIAGNOSIS — R410 Disorientation, unspecified: Secondary | ICD-10-CM | POA: Diagnosis not present

## 2021-05-28 DIAGNOSIS — J9600 Acute respiratory failure, unspecified whether with hypoxia or hypercapnia: Secondary | ICD-10-CM | POA: Diagnosis not present

## 2021-05-28 DIAGNOSIS — L89623 Pressure ulcer of left heel, stage 3: Secondary | ICD-10-CM | POA: Diagnosis not present

## 2021-05-28 DIAGNOSIS — J9602 Acute respiratory failure with hypercapnia: Secondary | ICD-10-CM | POA: Diagnosis not present

## 2021-05-28 DIAGNOSIS — Z7984 Long term (current) use of oral hypoglycemic drugs: Secondary | ICD-10-CM | POA: Diagnosis not present

## 2021-05-28 DIAGNOSIS — I10 Essential (primary) hypertension: Secondary | ICD-10-CM | POA: Diagnosis not present

## 2021-05-28 DIAGNOSIS — J9601 Acute respiratory failure with hypoxia: Secondary | ICD-10-CM | POA: Diagnosis not present

## 2021-05-29 DIAGNOSIS — F418 Other specified anxiety disorders: Secondary | ICD-10-CM | POA: Diagnosis not present

## 2021-05-29 DIAGNOSIS — I10 Essential (primary) hypertension: Secondary | ICD-10-CM | POA: Diagnosis not present

## 2021-05-29 DIAGNOSIS — G9340 Encephalopathy, unspecified: Secondary | ICD-10-CM | POA: Diagnosis not present

## 2021-05-29 DIAGNOSIS — R000 Tachycardia, unspecified: Secondary | ICD-10-CM | POA: Diagnosis not present

## 2021-05-29 DIAGNOSIS — R0602 Shortness of breath: Secondary | ICD-10-CM | POA: Diagnosis not present

## 2021-05-29 DIAGNOSIS — Z9981 Dependence on supplemental oxygen: Secondary | ICD-10-CM | POA: Diagnosis not present

## 2021-05-29 DIAGNOSIS — A4189 Other specified sepsis: Secondary | ICD-10-CM | POA: Diagnosis not present

## 2021-05-29 DIAGNOSIS — F0390 Unspecified dementia without behavioral disturbance: Secondary | ICD-10-CM | POA: Diagnosis not present

## 2021-05-29 DIAGNOSIS — R1310 Dysphagia, unspecified: Secondary | ICD-10-CM | POA: Diagnosis not present

## 2021-05-29 DIAGNOSIS — J9601 Acute respiratory failure with hypoxia: Secondary | ICD-10-CM | POA: Diagnosis not present

## 2021-05-29 DIAGNOSIS — J449 Chronic obstructive pulmonary disease, unspecified: Secondary | ICD-10-CM | POA: Diagnosis not present

## 2021-05-29 DIAGNOSIS — J189 Pneumonia, unspecified organism: Secondary | ICD-10-CM | POA: Diagnosis not present

## 2021-05-29 DIAGNOSIS — U071 COVID-19: Secondary | ICD-10-CM | POA: Diagnosis not present

## 2021-05-30 DIAGNOSIS — J9622 Acute and chronic respiratory failure with hypercapnia: Secondary | ICD-10-CM | POA: Diagnosis not present

## 2021-05-30 DIAGNOSIS — Z515 Encounter for palliative care: Secondary | ICD-10-CM | POA: Diagnosis not present

## 2021-05-30 DIAGNOSIS — J9621 Acute and chronic respiratory failure with hypoxia: Secondary | ICD-10-CM | POA: Diagnosis not present

## 2021-05-30 DIAGNOSIS — I10 Essential (primary) hypertension: Secondary | ICD-10-CM | POA: Diagnosis not present

## 2021-05-30 DIAGNOSIS — J189 Pneumonia, unspecified organism: Secondary | ICD-10-CM | POA: Diagnosis not present

## 2021-05-30 DIAGNOSIS — R1310 Dysphagia, unspecified: Secondary | ICD-10-CM | POA: Diagnosis not present

## 2021-05-30 DIAGNOSIS — J690 Pneumonitis due to inhalation of food and vomit: Secondary | ICD-10-CM | POA: Diagnosis not present

## 2021-05-30 DIAGNOSIS — E43 Unspecified severe protein-calorie malnutrition: Secondary | ICD-10-CM | POA: Diagnosis not present

## 2021-05-30 DIAGNOSIS — F0390 Unspecified dementia without behavioral disturbance: Secondary | ICD-10-CM | POA: Diagnosis not present

## 2021-05-30 DIAGNOSIS — E119 Type 2 diabetes mellitus without complications: Secondary | ICD-10-CM | POA: Diagnosis not present

## 2021-05-30 DIAGNOSIS — U071 COVID-19: Secondary | ICD-10-CM | POA: Diagnosis not present

## 2021-05-30 DIAGNOSIS — A419 Sepsis, unspecified organism: Secondary | ICD-10-CM | POA: Diagnosis not present

## 2021-05-30 DIAGNOSIS — J441 Chronic obstructive pulmonary disease with (acute) exacerbation: Secondary | ICD-10-CM | POA: Diagnosis not present

## 2021-05-31 DIAGNOSIS — J441 Chronic obstructive pulmonary disease with (acute) exacerbation: Secondary | ICD-10-CM | POA: Diagnosis not present

## 2021-05-31 DIAGNOSIS — I10 Essential (primary) hypertension: Secondary | ICD-10-CM | POA: Diagnosis not present

## 2021-05-31 DIAGNOSIS — J9621 Acute and chronic respiratory failure with hypoxia: Secondary | ICD-10-CM | POA: Diagnosis not present

## 2021-05-31 DIAGNOSIS — J9622 Acute and chronic respiratory failure with hypercapnia: Secondary | ICD-10-CM | POA: Diagnosis not present

## 2021-05-31 DIAGNOSIS — U071 COVID-19: Secondary | ICD-10-CM | POA: Diagnosis not present

## 2021-05-31 DIAGNOSIS — R410 Disorientation, unspecified: Secondary | ICD-10-CM | POA: Diagnosis not present

## 2021-05-31 DIAGNOSIS — J189 Pneumonia, unspecified organism: Secondary | ICD-10-CM | POA: Diagnosis not present

## 2021-05-31 DIAGNOSIS — F0390 Unspecified dementia without behavioral disturbance: Secondary | ICD-10-CM | POA: Diagnosis not present

## 2021-05-31 DIAGNOSIS — A419 Sepsis, unspecified organism: Secondary | ICD-10-CM | POA: Diagnosis not present

## 2021-05-31 DIAGNOSIS — E119 Type 2 diabetes mellitus without complications: Secondary | ICD-10-CM | POA: Diagnosis not present

## 2021-06-01 DIAGNOSIS — F0390 Unspecified dementia without behavioral disturbance: Secondary | ICD-10-CM | POA: Diagnosis not present

## 2021-06-01 DIAGNOSIS — I10 Essential (primary) hypertension: Secondary | ICD-10-CM | POA: Diagnosis not present

## 2021-06-01 DIAGNOSIS — U071 COVID-19: Secondary | ICD-10-CM | POA: Diagnosis not present

## 2021-06-01 DIAGNOSIS — E119 Type 2 diabetes mellitus without complications: Secondary | ICD-10-CM | POA: Diagnosis not present

## 2021-06-01 DIAGNOSIS — J9622 Acute and chronic respiratory failure with hypercapnia: Secondary | ICD-10-CM | POA: Diagnosis not present

## 2021-06-01 DIAGNOSIS — A419 Sepsis, unspecified organism: Secondary | ICD-10-CM | POA: Diagnosis not present

## 2021-06-01 DIAGNOSIS — J189 Pneumonia, unspecified organism: Secondary | ICD-10-CM | POA: Diagnosis not present

## 2021-06-01 DIAGNOSIS — J9621 Acute and chronic respiratory failure with hypoxia: Secondary | ICD-10-CM | POA: Diagnosis not present

## 2021-06-01 DIAGNOSIS — J441 Chronic obstructive pulmonary disease with (acute) exacerbation: Secondary | ICD-10-CM | POA: Diagnosis not present

## 2021-06-02 DIAGNOSIS — J189 Pneumonia, unspecified organism: Secondary | ICD-10-CM | POA: Diagnosis not present

## 2021-06-02 DIAGNOSIS — Z66 Do not resuscitate: Secondary | ICD-10-CM | POA: Diagnosis not present

## 2021-06-02 DIAGNOSIS — J9622 Acute and chronic respiratory failure with hypercapnia: Secondary | ICD-10-CM | POA: Diagnosis not present

## 2021-06-02 DIAGNOSIS — F418 Other specified anxiety disorders: Secondary | ICD-10-CM | POA: Diagnosis not present

## 2021-06-02 DIAGNOSIS — J441 Chronic obstructive pulmonary disease with (acute) exacerbation: Secondary | ICD-10-CM | POA: Diagnosis not present

## 2021-06-02 DIAGNOSIS — E43 Unspecified severe protein-calorie malnutrition: Secondary | ICD-10-CM | POA: Diagnosis not present

## 2021-06-02 DIAGNOSIS — J9621 Acute and chronic respiratory failure with hypoxia: Secondary | ICD-10-CM | POA: Diagnosis not present

## 2021-06-02 DIAGNOSIS — F0390 Unspecified dementia without behavioral disturbance: Secondary | ICD-10-CM | POA: Diagnosis not present

## 2021-06-02 DIAGNOSIS — R1310 Dysphagia, unspecified: Secondary | ICD-10-CM | POA: Diagnosis not present

## 2021-06-02 DIAGNOSIS — U071 COVID-19: Secondary | ICD-10-CM | POA: Diagnosis not present

## 2021-06-02 DIAGNOSIS — Z515 Encounter for palliative care: Secondary | ICD-10-CM | POA: Diagnosis not present

## 2021-06-02 DIAGNOSIS — Z7984 Long term (current) use of oral hypoglycemic drugs: Secondary | ICD-10-CM | POA: Diagnosis not present

## 2021-06-02 DIAGNOSIS — A4189 Other specified sepsis: Secondary | ICD-10-CM | POA: Diagnosis not present

## 2021-06-02 DIAGNOSIS — E119 Type 2 diabetes mellitus without complications: Secondary | ICD-10-CM | POA: Diagnosis not present

## 2021-06-02 DIAGNOSIS — R451 Restlessness and agitation: Secondary | ICD-10-CM | POA: Diagnosis not present

## 2021-06-02 DIAGNOSIS — I10 Essential (primary) hypertension: Secondary | ICD-10-CM | POA: Diagnosis not present

## 2021-06-04 DIAGNOSIS — L89890 Pressure ulcer of other site, unstageable: Secondary | ICD-10-CM | POA: Diagnosis not present

## 2021-06-04 DIAGNOSIS — L89610 Pressure ulcer of right heel, unstageable: Secondary | ICD-10-CM | POA: Diagnosis not present

## 2021-06-04 DIAGNOSIS — E1122 Type 2 diabetes mellitus with diabetic chronic kidney disease: Secondary | ICD-10-CM | POA: Diagnosis not present

## 2021-06-04 DIAGNOSIS — U071 COVID-19: Secondary | ICD-10-CM | POA: Diagnosis not present

## 2021-06-04 DIAGNOSIS — J1282 Pneumonia due to coronavirus disease 2019: Secondary | ICD-10-CM | POA: Diagnosis not present

## 2021-06-04 DIAGNOSIS — Z466 Encounter for fitting and adjustment of urinary device: Secondary | ICD-10-CM | POA: Diagnosis not present

## 2021-06-04 DIAGNOSIS — I5032 Chronic diastolic (congestive) heart failure: Secondary | ICD-10-CM | POA: Diagnosis not present

## 2021-06-04 DIAGNOSIS — J440 Chronic obstructive pulmonary disease with acute lower respiratory infection: Secondary | ICD-10-CM | POA: Diagnosis not present

## 2021-06-04 DIAGNOSIS — I130 Hypertensive heart and chronic kidney disease with heart failure and stage 1 through stage 4 chronic kidney disease, or unspecified chronic kidney disease: Secondary | ICD-10-CM | POA: Diagnosis not present

## 2021-06-09 DIAGNOSIS — L89610 Pressure ulcer of right heel, unstageable: Secondary | ICD-10-CM | POA: Diagnosis not present

## 2021-06-09 DIAGNOSIS — E1122 Type 2 diabetes mellitus with diabetic chronic kidney disease: Secondary | ICD-10-CM | POA: Diagnosis not present

## 2021-06-09 DIAGNOSIS — J440 Chronic obstructive pulmonary disease with acute lower respiratory infection: Secondary | ICD-10-CM | POA: Diagnosis not present

## 2021-06-09 DIAGNOSIS — U071 COVID-19: Secondary | ICD-10-CM | POA: Diagnosis not present

## 2021-06-09 DIAGNOSIS — J1282 Pneumonia due to coronavirus disease 2019: Secondary | ICD-10-CM | POA: Diagnosis not present

## 2021-06-09 DIAGNOSIS — I5032 Chronic diastolic (congestive) heart failure: Secondary | ICD-10-CM | POA: Diagnosis not present

## 2021-06-09 DIAGNOSIS — Z466 Encounter for fitting and adjustment of urinary device: Secondary | ICD-10-CM | POA: Diagnosis not present

## 2021-06-09 DIAGNOSIS — I130 Hypertensive heart and chronic kidney disease with heart failure and stage 1 through stage 4 chronic kidney disease, or unspecified chronic kidney disease: Secondary | ICD-10-CM | POA: Diagnosis not present

## 2021-06-09 DIAGNOSIS — L89890 Pressure ulcer of other site, unstageable: Secondary | ICD-10-CM | POA: Diagnosis not present

## 2021-06-10 DIAGNOSIS — I10 Essential (primary) hypertension: Secondary | ICD-10-CM | POA: Diagnosis not present

## 2021-06-10 DIAGNOSIS — R1310 Dysphagia, unspecified: Secondary | ICD-10-CM | POA: Diagnosis not present

## 2021-06-10 DIAGNOSIS — R4182 Altered mental status, unspecified: Secondary | ICD-10-CM | POA: Diagnosis not present

## 2021-06-10 DIAGNOSIS — J181 Lobar pneumonia, unspecified organism: Secondary | ICD-10-CM | POA: Diagnosis not present

## 2021-06-10 DIAGNOSIS — R7989 Other specified abnormal findings of blood chemistry: Secondary | ICD-10-CM | POA: Diagnosis not present

## 2021-06-10 DIAGNOSIS — J9621 Acute and chronic respiratory failure with hypoxia: Secondary | ICD-10-CM | POA: Diagnosis not present

## 2021-06-10 DIAGNOSIS — R778 Other specified abnormalities of plasma proteins: Secondary | ICD-10-CM | POA: Diagnosis not present

## 2021-06-10 DIAGNOSIS — Z743 Need for continuous supervision: Secondary | ICD-10-CM | POA: Diagnosis not present

## 2021-06-10 DIAGNOSIS — R001 Bradycardia, unspecified: Secondary | ICD-10-CM | POA: Diagnosis not present

## 2021-06-10 DIAGNOSIS — Z515 Encounter for palliative care: Secondary | ICD-10-CM | POA: Diagnosis not present

## 2021-06-10 DIAGNOSIS — J439 Emphysema, unspecified: Secondary | ICD-10-CM | POA: Diagnosis not present

## 2021-06-10 DIAGNOSIS — J441 Chronic obstructive pulmonary disease with (acute) exacerbation: Secondary | ICD-10-CM | POA: Diagnosis not present

## 2021-06-10 DIAGNOSIS — E43 Unspecified severe protein-calorie malnutrition: Secondary | ICD-10-CM | POA: Diagnosis not present

## 2021-06-10 DIAGNOSIS — F0390 Unspecified dementia without behavioral disturbance: Secondary | ICD-10-CM | POA: Diagnosis not present

## 2021-06-10 DIAGNOSIS — I5030 Unspecified diastolic (congestive) heart failure: Secondary | ICD-10-CM | POA: Diagnosis not present

## 2021-06-10 DIAGNOSIS — A419 Sepsis, unspecified organism: Secondary | ICD-10-CM | POA: Diagnosis not present

## 2021-06-10 DIAGNOSIS — J9622 Acute and chronic respiratory failure with hypercapnia: Secondary | ICD-10-CM | POA: Diagnosis not present

## 2021-06-10 DIAGNOSIS — I69354 Hemiplegia and hemiparesis following cerebral infarction affecting left non-dominant side: Secondary | ICD-10-CM | POA: Diagnosis not present

## 2021-06-10 DIAGNOSIS — L89152 Pressure ulcer of sacral region, stage 2: Secondary | ICD-10-CM | POA: Diagnosis not present

## 2021-06-10 DIAGNOSIS — J449 Chronic obstructive pulmonary disease, unspecified: Secondary | ICD-10-CM | POA: Diagnosis not present

## 2021-06-10 DIAGNOSIS — I69351 Hemiplegia and hemiparesis following cerebral infarction affecting right dominant side: Secondary | ICD-10-CM | POA: Diagnosis not present

## 2021-06-10 DIAGNOSIS — I5032 Chronic diastolic (congestive) heart failure: Secondary | ICD-10-CM | POA: Diagnosis not present

## 2021-06-10 DIAGNOSIS — Z7401 Bed confinement status: Secondary | ICD-10-CM | POA: Diagnosis not present

## 2021-06-10 DIAGNOSIS — Z8673 Personal history of transient ischemic attack (TIA), and cerebral infarction without residual deficits: Secondary | ICD-10-CM | POA: Diagnosis not present

## 2021-06-10 DIAGNOSIS — J690 Pneumonitis due to inhalation of food and vomit: Secondary | ICD-10-CM | POA: Diagnosis not present

## 2021-06-10 DIAGNOSIS — A4189 Other specified sepsis: Secondary | ICD-10-CM | POA: Diagnosis not present

## 2021-06-10 DIAGNOSIS — J189 Pneumonia, unspecified organism: Secondary | ICD-10-CM | POA: Diagnosis not present

## 2021-06-10 DIAGNOSIS — R0902 Hypoxemia: Secondary | ICD-10-CM | POA: Diagnosis not present

## 2021-06-10 DIAGNOSIS — Z66 Do not resuscitate: Secondary | ICD-10-CM | POA: Diagnosis not present

## 2021-06-10 DIAGNOSIS — L89150 Pressure ulcer of sacral region, unstageable: Secondary | ICD-10-CM | POA: Diagnosis not present

## 2021-06-10 DIAGNOSIS — I700 Atherosclerosis of aorta: Secondary | ICD-10-CM | POA: Diagnosis not present

## 2021-06-10 DIAGNOSIS — Z9981 Dependence on supplemental oxygen: Secondary | ICD-10-CM | POA: Diagnosis not present

## 2021-06-10 DIAGNOSIS — R0602 Shortness of breath: Secondary | ICD-10-CM | POA: Diagnosis not present

## 2021-06-10 DIAGNOSIS — R000 Tachycardia, unspecified: Secondary | ICD-10-CM | POA: Diagnosis not present

## 2021-06-10 DIAGNOSIS — J9690 Respiratory failure, unspecified, unspecified whether with hypoxia or hypercapnia: Secondary | ICD-10-CM | POA: Diagnosis not present

## 2021-06-10 DIAGNOSIS — L89610 Pressure ulcer of right heel, unstageable: Secondary | ICD-10-CM | POA: Diagnosis not present

## 2021-06-10 DIAGNOSIS — E785 Hyperlipidemia, unspecified: Secondary | ICD-10-CM | POA: Diagnosis not present

## 2021-06-10 DIAGNOSIS — R0600 Dyspnea, unspecified: Secondary | ICD-10-CM | POA: Diagnosis not present

## 2021-06-10 DIAGNOSIS — Z20822 Contact with and (suspected) exposure to covid-19: Secondary | ICD-10-CM | POA: Diagnosis not present

## 2021-06-10 DIAGNOSIS — E119 Type 2 diabetes mellitus without complications: Secondary | ICD-10-CM | POA: Diagnosis not present

## 2021-06-10 DIAGNOSIS — I110 Hypertensive heart disease with heart failure: Secondary | ICD-10-CM | POA: Diagnosis not present

## 2021-06-11 DIAGNOSIS — F0390 Unspecified dementia without behavioral disturbance: Secondary | ICD-10-CM | POA: Diagnosis not present

## 2021-06-11 DIAGNOSIS — J449 Chronic obstructive pulmonary disease, unspecified: Secondary | ICD-10-CM | POA: Diagnosis not present

## 2021-06-11 DIAGNOSIS — L89152 Pressure ulcer of sacral region, stage 2: Secondary | ICD-10-CM | POA: Diagnosis not present

## 2021-06-11 DIAGNOSIS — J9621 Acute and chronic respiratory failure with hypoxia: Secondary | ICD-10-CM | POA: Diagnosis not present

## 2021-06-11 DIAGNOSIS — R0600 Dyspnea, unspecified: Secondary | ICD-10-CM | POA: Diagnosis not present

## 2021-06-11 DIAGNOSIS — R4182 Altered mental status, unspecified: Secondary | ICD-10-CM | POA: Diagnosis not present

## 2021-06-11 DIAGNOSIS — I700 Atherosclerosis of aorta: Secondary | ICD-10-CM | POA: Diagnosis not present

## 2021-06-11 DIAGNOSIS — R7989 Other specified abnormal findings of blood chemistry: Secondary | ICD-10-CM | POA: Diagnosis not present

## 2021-06-11 DIAGNOSIS — Z8673 Personal history of transient ischemic attack (TIA), and cerebral infarction without residual deficits: Secondary | ICD-10-CM | POA: Diagnosis not present

## 2021-06-11 DIAGNOSIS — J439 Emphysema, unspecified: Secondary | ICD-10-CM | POA: Diagnosis not present

## 2021-06-12 DIAGNOSIS — R7989 Other specified abnormal findings of blood chemistry: Secondary | ICD-10-CM | POA: Diagnosis not present

## 2021-06-12 DIAGNOSIS — R001 Bradycardia, unspecified: Secondary | ICD-10-CM | POA: Diagnosis not present

## 2021-06-12 DIAGNOSIS — E785 Hyperlipidemia, unspecified: Secondary | ICD-10-CM | POA: Diagnosis not present

## 2021-06-12 DIAGNOSIS — I10 Essential (primary) hypertension: Secondary | ICD-10-CM | POA: Diagnosis not present

## 2021-06-12 DIAGNOSIS — F0390 Unspecified dementia without behavioral disturbance: Secondary | ICD-10-CM | POA: Diagnosis not present

## 2021-06-12 DIAGNOSIS — J9621 Acute and chronic respiratory failure with hypoxia: Secondary | ICD-10-CM | POA: Diagnosis not present

## 2021-06-12 DIAGNOSIS — J449 Chronic obstructive pulmonary disease, unspecified: Secondary | ICD-10-CM | POA: Diagnosis not present

## 2021-06-12 DIAGNOSIS — Z8673 Personal history of transient ischemic attack (TIA), and cerebral infarction without residual deficits: Secondary | ICD-10-CM | POA: Diagnosis not present

## 2021-06-12 DIAGNOSIS — L89152 Pressure ulcer of sacral region, stage 2: Secondary | ICD-10-CM | POA: Diagnosis not present

## 2021-06-13 DIAGNOSIS — J181 Lobar pneumonia, unspecified organism: Secondary | ICD-10-CM | POA: Diagnosis not present

## 2021-06-14 DIAGNOSIS — I5030 Unspecified diastolic (congestive) heart failure: Secondary | ICD-10-CM | POA: Diagnosis not present

## 2021-06-14 DIAGNOSIS — R0600 Dyspnea, unspecified: Secondary | ICD-10-CM | POA: Diagnosis not present

## 2021-06-14 DIAGNOSIS — J9622 Acute and chronic respiratory failure with hypercapnia: Secondary | ICD-10-CM | POA: Diagnosis not present

## 2021-06-14 DIAGNOSIS — F0390 Unspecified dementia without behavioral disturbance: Secondary | ICD-10-CM | POA: Diagnosis not present

## 2021-06-14 DIAGNOSIS — L89610 Pressure ulcer of right heel, unstageable: Secondary | ICD-10-CM | POA: Diagnosis not present

## 2021-06-14 DIAGNOSIS — J9621 Acute and chronic respiratory failure with hypoxia: Secondary | ICD-10-CM | POA: Diagnosis not present

## 2021-06-14 DIAGNOSIS — Z9981 Dependence on supplemental oxygen: Secondary | ICD-10-CM | POA: Diagnosis not present

## 2021-06-14 DIAGNOSIS — I700 Atherosclerosis of aorta: Secondary | ICD-10-CM | POA: Diagnosis not present

## 2021-06-14 DIAGNOSIS — L89150 Pressure ulcer of sacral region, unstageable: Secondary | ICD-10-CM | POA: Diagnosis not present

## 2021-06-14 DIAGNOSIS — R0902 Hypoxemia: Secondary | ICD-10-CM | POA: Diagnosis not present

## 2021-06-14 DIAGNOSIS — J9690 Respiratory failure, unspecified, unspecified whether with hypoxia or hypercapnia: Secondary | ICD-10-CM | POA: Diagnosis not present

## 2021-06-14 DIAGNOSIS — J441 Chronic obstructive pulmonary disease with (acute) exacerbation: Secondary | ICD-10-CM | POA: Diagnosis not present

## 2021-06-23 DIAGNOSIS — I5033 Acute on chronic diastolic (congestive) heart failure: Secondary | ICD-10-CM | POA: Diagnosis not present

## 2021-06-23 DIAGNOSIS — A419 Sepsis, unspecified organism: Secondary | ICD-10-CM | POA: Diagnosis not present

## 2021-06-23 DIAGNOSIS — I10 Essential (primary) hypertension: Secondary | ICD-10-CM | POA: Diagnosis not present

## 2021-06-23 DIAGNOSIS — E785 Hyperlipidemia, unspecified: Secondary | ICD-10-CM | POA: Diagnosis not present

## 2021-06-23 DIAGNOSIS — N400 Enlarged prostate without lower urinary tract symptoms: Secondary | ICD-10-CM | POA: Diagnosis not present

## 2021-06-23 DIAGNOSIS — J9622 Acute and chronic respiratory failure with hypercapnia: Secondary | ICD-10-CM | POA: Diagnosis not present

## 2021-06-23 DIAGNOSIS — J690 Pneumonitis due to inhalation of food and vomit: Secondary | ICD-10-CM | POA: Diagnosis not present

## 2021-06-23 DIAGNOSIS — F0390 Unspecified dementia without behavioral disturbance: Secondary | ICD-10-CM | POA: Diagnosis not present

## 2021-06-23 DIAGNOSIS — G8929 Other chronic pain: Secondary | ICD-10-CM | POA: Diagnosis not present

## 2021-06-23 DIAGNOSIS — J9621 Acute and chronic respiratory failure with hypoxia: Secondary | ICD-10-CM | POA: Diagnosis not present

## 2021-06-23 DIAGNOSIS — J449 Chronic obstructive pulmonary disease, unspecified: Secondary | ICD-10-CM | POA: Diagnosis not present

## 2021-06-23 DIAGNOSIS — E559 Vitamin D deficiency, unspecified: Secondary | ICD-10-CM | POA: Diagnosis not present

## 2021-06-23 DIAGNOSIS — E43 Unspecified severe protein-calorie malnutrition: Secondary | ICD-10-CM | POA: Diagnosis not present

## 2021-06-23 DIAGNOSIS — E119 Type 2 diabetes mellitus without complications: Secondary | ICD-10-CM | POA: Diagnosis not present

## 2021-06-23 DIAGNOSIS — I69391 Dysphagia following cerebral infarction: Secondary | ICD-10-CM | POA: Diagnosis not present

## 2021-06-24 DIAGNOSIS — I69391 Dysphagia following cerebral infarction: Secondary | ICD-10-CM | POA: Diagnosis not present

## 2021-06-24 DIAGNOSIS — J9621 Acute and chronic respiratory failure with hypoxia: Secondary | ICD-10-CM | POA: Diagnosis not present

## 2021-06-24 DIAGNOSIS — E119 Type 2 diabetes mellitus without complications: Secondary | ICD-10-CM | POA: Diagnosis not present

## 2021-06-24 DIAGNOSIS — L97419 Non-pressure chronic ulcer of right heel and midfoot with unspecified severity: Secondary | ICD-10-CM | POA: Diagnosis not present

## 2021-06-24 DIAGNOSIS — J9600 Acute respiratory failure, unspecified whether with hypoxia or hypercapnia: Secondary | ICD-10-CM | POA: Diagnosis not present

## 2021-06-24 DIAGNOSIS — J9622 Acute and chronic respiratory failure with hypercapnia: Secondary | ICD-10-CM | POA: Diagnosis not present

## 2021-06-24 DIAGNOSIS — I509 Heart failure, unspecified: Secondary | ICD-10-CM | POA: Diagnosis not present

## 2021-06-24 DIAGNOSIS — R5381 Other malaise: Secondary | ICD-10-CM | POA: Diagnosis not present

## 2021-06-24 DIAGNOSIS — J690 Pneumonitis due to inhalation of food and vomit: Secondary | ICD-10-CM | POA: Diagnosis not present

## 2021-06-24 DIAGNOSIS — A419 Sepsis, unspecified organism: Secondary | ICD-10-CM | POA: Diagnosis not present

## 2021-06-24 DIAGNOSIS — I5033 Acute on chronic diastolic (congestive) heart failure: Secondary | ICD-10-CM | POA: Diagnosis not present

## 2021-06-24 DIAGNOSIS — J449 Chronic obstructive pulmonary disease, unspecified: Secondary | ICD-10-CM | POA: Diagnosis not present

## 2021-06-24 DIAGNOSIS — E43 Unspecified severe protein-calorie malnutrition: Secondary | ICD-10-CM | POA: Diagnosis not present

## 2021-06-25 DIAGNOSIS — J690 Pneumonitis due to inhalation of food and vomit: Secondary | ICD-10-CM | POA: Diagnosis not present

## 2021-06-25 DIAGNOSIS — J449 Chronic obstructive pulmonary disease, unspecified: Secondary | ICD-10-CM | POA: Diagnosis not present

## 2021-06-25 DIAGNOSIS — J9622 Acute and chronic respiratory failure with hypercapnia: Secondary | ICD-10-CM | POA: Diagnosis not present

## 2021-06-25 DIAGNOSIS — A419 Sepsis, unspecified organism: Secondary | ICD-10-CM | POA: Diagnosis not present

## 2021-06-25 DIAGNOSIS — I69391 Dysphagia following cerebral infarction: Secondary | ICD-10-CM | POA: Diagnosis not present

## 2021-06-25 DIAGNOSIS — E43 Unspecified severe protein-calorie malnutrition: Secondary | ICD-10-CM | POA: Diagnosis not present

## 2021-06-25 DIAGNOSIS — E119 Type 2 diabetes mellitus without complications: Secondary | ICD-10-CM | POA: Diagnosis not present

## 2021-06-25 DIAGNOSIS — I5033 Acute on chronic diastolic (congestive) heart failure: Secondary | ICD-10-CM | POA: Diagnosis not present

## 2021-06-25 DIAGNOSIS — J9610 Chronic respiratory failure, unspecified whether with hypoxia or hypercapnia: Secondary | ICD-10-CM | POA: Diagnosis not present

## 2021-06-25 DIAGNOSIS — I10 Essential (primary) hypertension: Secondary | ICD-10-CM | POA: Diagnosis not present

## 2021-06-25 DIAGNOSIS — J9621 Acute and chronic respiratory failure with hypoxia: Secondary | ICD-10-CM | POA: Diagnosis not present

## 2021-06-26 DIAGNOSIS — Z978 Presence of other specified devices: Secondary | ICD-10-CM | POA: Diagnosis not present

## 2021-06-26 DIAGNOSIS — F0390 Unspecified dementia without behavioral disturbance: Secondary | ICD-10-CM | POA: Diagnosis not present

## 2021-06-26 DIAGNOSIS — J690 Pneumonitis due to inhalation of food and vomit: Secondary | ICD-10-CM | POA: Diagnosis not present

## 2021-06-26 DIAGNOSIS — A419 Sepsis, unspecified organism: Secondary | ICD-10-CM | POA: Diagnosis not present

## 2021-06-26 DIAGNOSIS — E43 Unspecified severe protein-calorie malnutrition: Secondary | ICD-10-CM | POA: Diagnosis not present

## 2021-06-26 DIAGNOSIS — R410 Disorientation, unspecified: Secondary | ICD-10-CM | POA: Diagnosis not present

## 2021-06-26 DIAGNOSIS — I69391 Dysphagia following cerebral infarction: Secondary | ICD-10-CM | POA: Diagnosis not present

## 2021-06-26 DIAGNOSIS — J449 Chronic obstructive pulmonary disease, unspecified: Secondary | ICD-10-CM | POA: Diagnosis not present

## 2021-06-26 DIAGNOSIS — J9621 Acute and chronic respiratory failure with hypoxia: Secondary | ICD-10-CM | POA: Diagnosis not present

## 2021-06-26 DIAGNOSIS — J9622 Acute and chronic respiratory failure with hypercapnia: Secondary | ICD-10-CM | POA: Diagnosis not present

## 2021-06-26 DIAGNOSIS — E119 Type 2 diabetes mellitus without complications: Secondary | ICD-10-CM | POA: Diagnosis not present

## 2021-06-26 DIAGNOSIS — I5033 Acute on chronic diastolic (congestive) heart failure: Secondary | ICD-10-CM | POA: Diagnosis not present

## 2021-06-26 DIAGNOSIS — R197 Diarrhea, unspecified: Secondary | ICD-10-CM | POA: Diagnosis not present

## 2021-06-27 DIAGNOSIS — J9622 Acute and chronic respiratory failure with hypercapnia: Secondary | ICD-10-CM | POA: Diagnosis not present

## 2021-06-27 DIAGNOSIS — J9621 Acute and chronic respiratory failure with hypoxia: Secondary | ICD-10-CM | POA: Diagnosis not present

## 2021-06-27 DIAGNOSIS — A419 Sepsis, unspecified organism: Secondary | ICD-10-CM | POA: Diagnosis not present

## 2021-06-27 DIAGNOSIS — E119 Type 2 diabetes mellitus without complications: Secondary | ICD-10-CM | POA: Diagnosis not present

## 2021-06-27 DIAGNOSIS — I69391 Dysphagia following cerebral infarction: Secondary | ICD-10-CM | POA: Diagnosis not present

## 2021-06-27 DIAGNOSIS — J690 Pneumonitis due to inhalation of food and vomit: Secondary | ICD-10-CM | POA: Diagnosis not present

## 2021-06-27 DIAGNOSIS — J449 Chronic obstructive pulmonary disease, unspecified: Secondary | ICD-10-CM | POA: Diagnosis not present

## 2021-06-27 DIAGNOSIS — E43 Unspecified severe protein-calorie malnutrition: Secondary | ICD-10-CM | POA: Diagnosis not present

## 2021-06-27 DIAGNOSIS — I5033 Acute on chronic diastolic (congestive) heart failure: Secondary | ICD-10-CM | POA: Diagnosis not present

## 2021-06-28 DIAGNOSIS — J181 Lobar pneumonia, unspecified organism: Secondary | ICD-10-CM | POA: Diagnosis not present

## 2021-06-28 DIAGNOSIS — A419 Sepsis, unspecified organism: Secondary | ICD-10-CM | POA: Diagnosis not present

## 2021-06-28 DIAGNOSIS — N390 Urinary tract infection, site not specified: Secondary | ICD-10-CM | POA: Diagnosis not present

## 2021-07-05 DEATH — deceased
